# Patient Record
Sex: Female | Born: 1970 | Race: Black or African American | Hispanic: No | Marital: Single | Smoking: Never smoker
Health system: Southern US, Community
[De-identification: ages and names within clinical notes are randomized; demographics above are authoritative.]

## PROBLEM LIST (undated history)

## (undated) DIAGNOSIS — T7840XA Allergy, unspecified, initial encounter: Secondary | ICD-10-CM

## (undated) DIAGNOSIS — E785 Hyperlipidemia, unspecified: Secondary | ICD-10-CM

## (undated) DIAGNOSIS — M722 Plantar fascial fibromatosis: Secondary | ICD-10-CM

## (undated) DIAGNOSIS — D573 Sickle-cell trait: Secondary | ICD-10-CM

## (undated) DIAGNOSIS — R87619 Unspecified abnormal cytological findings in specimens from cervix uteri: Secondary | ICD-10-CM

## (undated) DIAGNOSIS — I4891 Unspecified atrial fibrillation: Secondary | ICD-10-CM

## (undated) DIAGNOSIS — K219 Gastro-esophageal reflux disease without esophagitis: Secondary | ICD-10-CM

## (undated) DIAGNOSIS — I10 Essential (primary) hypertension: Secondary | ICD-10-CM

## (undated) DIAGNOSIS — D571 Sickle-cell disease without crisis: Secondary | ICD-10-CM

## (undated) DIAGNOSIS — E669 Obesity, unspecified: Secondary | ICD-10-CM

## (undated) DIAGNOSIS — IMO0002 Reserved for concepts with insufficient information to code with codable children: Secondary | ICD-10-CM

## (undated) HISTORY — DX: Reserved for concepts with insufficient information to code with codable children: IMO0002

## (undated) HISTORY — DX: Plantar fascial fibromatosis: M72.2

## (undated) HISTORY — DX: Gastro-esophageal reflux disease without esophagitis: K21.9

## (undated) HISTORY — DX: Unspecified atrial fibrillation: I48.91

## (undated) HISTORY — PX: ILIAC VEIN ANGIOPLASTY / STENTING: SHX1788

## (undated) HISTORY — PX: TUBAL LIGATION: SHX77

## (undated) HISTORY — DX: Sickle-cell trait: D57.3

## (undated) HISTORY — DX: Essential (primary) hypertension: I10

## (undated) HISTORY — DX: Unspecified abnormal cytological findings in specimens from cervix uteri: R87.619

## (undated) HISTORY — DX: Sickle-cell disease without crisis: D57.1

## (undated) HISTORY — DX: Allergy, unspecified, initial encounter: T78.40XA

## (undated) HISTORY — DX: Hyperlipidemia, unspecified: E78.5

## (undated) HISTORY — DX: Obesity, unspecified: E66.9

---

## 1993-04-25 DIAGNOSIS — I10 Essential (primary) hypertension: Secondary | ICD-10-CM

## 1993-04-25 DIAGNOSIS — D573 Sickle-cell trait: Secondary | ICD-10-CM

## 1993-04-25 HISTORY — DX: Sickle-cell trait: D57.3

## 1993-04-25 HISTORY — DX: Essential (primary) hypertension: I10

## 1997-10-09 ENCOUNTER — Encounter: Admission: RE | Admit: 1997-10-09 | Discharge: 1997-10-09 | Payer: Self-pay | Admitting: Sports Medicine

## 1997-10-21 ENCOUNTER — Encounter: Admission: RE | Admit: 1997-10-21 | Discharge: 1997-10-21 | Payer: Self-pay | Admitting: Family Medicine

## 1997-11-10 ENCOUNTER — Encounter: Admission: RE | Admit: 1997-11-10 | Discharge: 1997-11-10 | Payer: Self-pay | Admitting: Family Medicine

## 1998-01-27 ENCOUNTER — Emergency Department (HOSPITAL_COMMUNITY): Admission: EM | Admit: 1998-01-27 | Discharge: 1998-01-27 | Payer: Self-pay | Admitting: Emergency Medicine

## 1999-03-09 ENCOUNTER — Emergency Department (HOSPITAL_COMMUNITY): Admission: EM | Admit: 1999-03-09 | Discharge: 1999-03-09 | Payer: Self-pay | Admitting: *Deleted

## 2000-07-14 ENCOUNTER — Emergency Department (HOSPITAL_COMMUNITY): Admission: EM | Admit: 2000-07-14 | Discharge: 2000-07-14 | Payer: Self-pay | Admitting: *Deleted

## 2000-08-18 ENCOUNTER — Encounter: Admission: RE | Admit: 2000-08-18 | Discharge: 2000-08-18 | Payer: Self-pay | Admitting: Family Medicine

## 2001-09-10 ENCOUNTER — Encounter: Payer: Self-pay | Admitting: Internal Medicine

## 2001-09-10 ENCOUNTER — Ambulatory Visit (HOSPITAL_COMMUNITY): Admission: RE | Admit: 2001-09-10 | Discharge: 2001-09-10 | Payer: Self-pay | Admitting: Internal Medicine

## 2002-06-12 ENCOUNTER — Ambulatory Visit (HOSPITAL_COMMUNITY): Admission: RE | Admit: 2002-06-12 | Discharge: 2002-06-12 | Payer: Self-pay | Admitting: Internal Medicine

## 2002-06-12 ENCOUNTER — Encounter: Payer: Self-pay | Admitting: Internal Medicine

## 2002-08-12 ENCOUNTER — Other Ambulatory Visit: Admission: RE | Admit: 2002-08-12 | Discharge: 2002-08-12 | Payer: Self-pay | Admitting: Family Medicine

## 2002-09-20 ENCOUNTER — Other Ambulatory Visit: Admission: RE | Admit: 2002-09-20 | Discharge: 2002-09-20 | Payer: Self-pay | Admitting: Family Medicine

## 2002-09-20 ENCOUNTER — Encounter: Admission: RE | Admit: 2002-09-20 | Discharge: 2002-09-20 | Payer: Self-pay | Admitting: Family Medicine

## 2002-09-20 ENCOUNTER — Encounter (INDEPENDENT_AMBULATORY_CARE_PROVIDER_SITE_OTHER): Payer: Self-pay

## 2002-10-04 ENCOUNTER — Encounter: Admission: RE | Admit: 2002-10-04 | Discharge: 2002-10-04 | Payer: Self-pay | Admitting: Obstetrics and Gynecology

## 2003-04-24 ENCOUNTER — Encounter: Admission: RE | Admit: 2003-04-24 | Discharge: 2003-04-24 | Payer: Self-pay | Admitting: Family Medicine

## 2003-04-24 ENCOUNTER — Encounter (INDEPENDENT_AMBULATORY_CARE_PROVIDER_SITE_OTHER): Payer: Self-pay | Admitting: *Deleted

## 2003-04-26 HISTORY — PX: ABDOMINAL HYSTERECTOMY: SHX81

## 2003-05-05 ENCOUNTER — Ambulatory Visit (HOSPITAL_COMMUNITY): Admission: RE | Admit: 2003-05-05 | Discharge: 2003-05-05 | Payer: Self-pay | Admitting: Family Medicine

## 2003-05-29 ENCOUNTER — Encounter: Admission: RE | Admit: 2003-05-29 | Discharge: 2003-05-29 | Payer: Self-pay | Admitting: Obstetrics and Gynecology

## 2003-05-29 ENCOUNTER — Emergency Department (HOSPITAL_COMMUNITY): Admission: EM | Admit: 2003-05-29 | Discharge: 2003-05-29 | Payer: Self-pay | Admitting: Emergency Medicine

## 2003-06-12 ENCOUNTER — Encounter: Admission: RE | Admit: 2003-06-12 | Discharge: 2003-06-12 | Payer: Self-pay | Admitting: Obstetrics and Gynecology

## 2003-07-28 ENCOUNTER — Inpatient Hospital Stay (HOSPITAL_COMMUNITY): Admission: RE | Admit: 2003-07-28 | Discharge: 2003-07-30 | Payer: Self-pay | Admitting: Family Medicine

## 2003-07-28 ENCOUNTER — Encounter (INDEPENDENT_AMBULATORY_CARE_PROVIDER_SITE_OTHER): Payer: Self-pay | Admitting: *Deleted

## 2003-08-12 ENCOUNTER — Encounter: Admission: RE | Admit: 2003-08-12 | Discharge: 2003-08-12 | Payer: Self-pay | Admitting: Obstetrics and Gynecology

## 2003-09-18 ENCOUNTER — Encounter: Admission: RE | Admit: 2003-09-18 | Discharge: 2003-09-18 | Payer: Self-pay | Admitting: Obstetrics and Gynecology

## 2004-01-22 ENCOUNTER — Ambulatory Visit: Payer: Self-pay | Admitting: Nurse Practitioner

## 2004-02-18 ENCOUNTER — Ambulatory Visit: Payer: Self-pay | Admitting: Nurse Practitioner

## 2004-02-27 ENCOUNTER — Ambulatory Visit: Payer: Self-pay | Admitting: Family Medicine

## 2004-08-02 ENCOUNTER — Ambulatory Visit: Payer: Self-pay | Admitting: Nurse Practitioner

## 2004-08-04 ENCOUNTER — Ambulatory Visit: Payer: Self-pay | Admitting: Nurse Practitioner

## 2005-06-29 ENCOUNTER — Ambulatory Visit: Payer: Self-pay | Admitting: Family Medicine

## 2005-07-15 ENCOUNTER — Ambulatory Visit: Payer: Self-pay | Admitting: Family Medicine

## 2005-07-25 ENCOUNTER — Ambulatory Visit: Payer: Self-pay | Admitting: Family Medicine

## 2005-08-15 ENCOUNTER — Ambulatory Visit: Payer: Self-pay | Admitting: Family Medicine

## 2005-08-22 ENCOUNTER — Ambulatory Visit: Payer: Self-pay | Admitting: Sports Medicine

## 2005-09-03 ENCOUNTER — Emergency Department (HOSPITAL_COMMUNITY): Admission: EM | Admit: 2005-09-03 | Discharge: 2005-09-04 | Payer: Self-pay | Admitting: Emergency Medicine

## 2005-12-01 ENCOUNTER — Ambulatory Visit: Payer: Self-pay | Admitting: Family Medicine

## 2005-12-01 ENCOUNTER — Ambulatory Visit (HOSPITAL_COMMUNITY): Admission: RE | Admit: 2005-12-01 | Discharge: 2005-12-01 | Payer: Self-pay | Admitting: Family Medicine

## 2005-12-07 ENCOUNTER — Ambulatory Visit (HOSPITAL_COMMUNITY): Admission: RE | Admit: 2005-12-07 | Discharge: 2005-12-07 | Payer: Self-pay | Admitting: Family Medicine

## 2005-12-07 ENCOUNTER — Encounter: Payer: Self-pay | Admitting: Vascular Surgery

## 2006-04-14 ENCOUNTER — Ambulatory Visit: Payer: Self-pay | Admitting: Nurse Practitioner

## 2006-04-19 ENCOUNTER — Ambulatory Visit (HOSPITAL_COMMUNITY): Admission: RE | Admit: 2006-04-19 | Discharge: 2006-04-19 | Payer: Self-pay | Admitting: Family Medicine

## 2006-04-19 ENCOUNTER — Encounter: Payer: Self-pay | Admitting: Vascular Surgery

## 2006-04-21 ENCOUNTER — Ambulatory Visit: Payer: Self-pay | Admitting: *Deleted

## 2006-04-27 ENCOUNTER — Ambulatory Visit: Payer: Self-pay | Admitting: Nurse Practitioner

## 2006-05-10 ENCOUNTER — Ambulatory Visit: Payer: Self-pay | Admitting: Nurse Practitioner

## 2006-06-22 DIAGNOSIS — I10 Essential (primary) hypertension: Secondary | ICD-10-CM | POA: Insufficient documentation

## 2006-06-22 DIAGNOSIS — K219 Gastro-esophageal reflux disease without esophagitis: Secondary | ICD-10-CM

## 2006-06-22 DIAGNOSIS — E669 Obesity, unspecified: Secondary | ICD-10-CM

## 2006-07-24 ENCOUNTER — Emergency Department (HOSPITAL_COMMUNITY): Admission: EM | Admit: 2006-07-24 | Discharge: 2006-07-24 | Payer: Self-pay | Admitting: Emergency Medicine

## 2006-10-11 ENCOUNTER — Emergency Department (HOSPITAL_COMMUNITY): Admission: EM | Admit: 2006-10-11 | Discharge: 2006-10-11 | Payer: Self-pay | Admitting: Family Medicine

## 2006-10-12 ENCOUNTER — Ambulatory Visit: Payer: Self-pay | Admitting: Internal Medicine

## 2006-10-16 ENCOUNTER — Ambulatory Visit (HOSPITAL_COMMUNITY): Admission: RE | Admit: 2006-10-16 | Discharge: 2006-10-16 | Payer: Self-pay | Admitting: Internal Medicine

## 2006-10-19 ENCOUNTER — Ambulatory Visit: Payer: Self-pay | Admitting: Internal Medicine

## 2006-10-20 ENCOUNTER — Ambulatory Visit (HOSPITAL_COMMUNITY): Admission: RE | Admit: 2006-10-20 | Discharge: 2006-10-20 | Payer: Self-pay | Admitting: Family Medicine

## 2006-10-20 ENCOUNTER — Ambulatory Visit: Payer: Self-pay | Admitting: Vascular Surgery

## 2006-10-20 ENCOUNTER — Encounter (INDEPENDENT_AMBULATORY_CARE_PROVIDER_SITE_OTHER): Payer: Self-pay | Admitting: Family Medicine

## 2007-01-10 ENCOUNTER — Encounter (INDEPENDENT_AMBULATORY_CARE_PROVIDER_SITE_OTHER): Payer: Self-pay | Admitting: *Deleted

## 2007-01-16 ENCOUNTER — Ambulatory Visit: Payer: Self-pay | Admitting: Vascular Surgery

## 2007-01-23 ENCOUNTER — Ambulatory Visit: Payer: Self-pay | Admitting: Vascular Surgery

## 2007-02-06 ENCOUNTER — Ambulatory Visit (HOSPITAL_COMMUNITY): Admission: RE | Admit: 2007-02-06 | Discharge: 2007-02-06 | Payer: Self-pay | Admitting: Surgery

## 2007-02-06 ENCOUNTER — Ambulatory Visit: Payer: Self-pay | Admitting: Surgery

## 2007-03-12 ENCOUNTER — Ambulatory Visit: Payer: Self-pay | Admitting: Surgery

## 2007-09-29 ENCOUNTER — Emergency Department (HOSPITAL_COMMUNITY): Admission: EM | Admit: 2007-09-29 | Discharge: 2007-09-29 | Payer: Self-pay | Admitting: Family Medicine

## 2007-11-02 ENCOUNTER — Ambulatory Visit: Payer: Self-pay | Admitting: Family Medicine

## 2007-11-02 ENCOUNTER — Telehealth: Payer: Self-pay | Admitting: *Deleted

## 2007-11-12 ENCOUNTER — Ambulatory Visit: Payer: Self-pay | Admitting: Family Medicine

## 2007-11-12 ENCOUNTER — Encounter: Payer: Self-pay | Admitting: Family Medicine

## 2007-11-12 ENCOUNTER — Ambulatory Visit: Payer: Self-pay | Admitting: Surgery

## 2007-11-15 LAB — CONVERTED CEMR LAB
AST: 16 units/L (ref 0–37)
Alkaline Phosphatase: 64 units/L (ref 39–117)
BUN: 21 mg/dL (ref 6–23)
Creatinine, Ser: 1.49 mg/dL — ABNORMAL HIGH (ref 0.40–1.20)
Glucose, Bld: 103 mg/dL — ABNORMAL HIGH (ref 70–99)
HDL: 29 mg/dL — ABNORMAL LOW (ref >39)
LDL Cholesterol: 98 mg/dL (ref 0–99)
Total Bilirubin: 0.4 mg/dL (ref 0.3–1.2)
Total CHOL/HDL Ratio: 5
Triglycerides: 90 mg/dL (ref <150)
VLDL: 18 mg/dL (ref 0–40)

## 2008-03-26 ENCOUNTER — Ambulatory Visit: Payer: Self-pay | Admitting: Family Medicine

## 2008-03-26 ENCOUNTER — Encounter: Payer: Self-pay | Admitting: Family Medicine

## 2008-03-26 LAB — CONVERTED CEMR LAB
CO2: 26 meq/L (ref 19–32)
Calcium: 10.2 mg/dL (ref 8.4–10.5)
Creatinine, Ser: 1.01 mg/dL (ref 0.40–1.20)
Sodium: 139 meq/L (ref 135–145)

## 2008-03-31 ENCOUNTER — Ambulatory Visit: Payer: Self-pay | Admitting: Family Medicine

## 2008-04-21 ENCOUNTER — Ambulatory Visit: Payer: Self-pay | Admitting: Surgery

## 2008-04-22 ENCOUNTER — Ambulatory Visit: Payer: Self-pay | Admitting: Family Medicine

## 2008-04-22 ENCOUNTER — Ambulatory Visit (HOSPITAL_COMMUNITY): Admission: RE | Admit: 2008-04-22 | Discharge: 2008-04-22 | Payer: Self-pay | Admitting: Family Medicine

## 2008-04-22 ENCOUNTER — Encounter: Payer: Self-pay | Admitting: Family Medicine

## 2008-04-22 LAB — CONVERTED CEMR LAB
Chloride: 102 meq/L (ref 96–112)
Creatinine, Ser: 1.23 mg/dL — ABNORMAL HIGH (ref 0.40–1.20)
Potassium: 4.3 meq/L (ref 3.5–5.3)
Sodium: 140 meq/L (ref 135–145)

## 2008-04-24 ENCOUNTER — Encounter: Payer: Self-pay | Admitting: Family Medicine

## 2008-06-27 ENCOUNTER — Encounter: Payer: Self-pay | Admitting: Family Medicine

## 2008-06-27 ENCOUNTER — Ambulatory Visit: Payer: Self-pay | Admitting: Family Medicine

## 2008-06-27 LAB — CONVERTED CEMR LAB
BUN: 19 mg/dL (ref 6–23)
CO2: 28 meq/L (ref 19–32)
Chloride: 102 meq/L (ref 96–112)
Potassium: 3.9 meq/L (ref 3.5–5.3)

## 2008-07-01 ENCOUNTER — Encounter: Payer: Self-pay | Admitting: Family Medicine

## 2008-10-20 ENCOUNTER — Ambulatory Visit: Payer: Self-pay | Admitting: Surgery

## 2009-02-03 ENCOUNTER — Ambulatory Visit: Payer: Self-pay | Admitting: Family Medicine

## 2009-02-03 ENCOUNTER — Encounter: Payer: Self-pay | Admitting: Family Medicine

## 2009-02-03 LAB — CONVERTED CEMR LAB
BUN: 9 mg/dL (ref 6–23)
Chloride: 102 meq/L (ref 96–112)
Creatinine, Ser: 0.92 mg/dL (ref 0.40–1.20)
Glucose, Bld: 107 mg/dL — ABNORMAL HIGH (ref 70–99)
Potassium: 3.9 meq/L (ref 3.5–5.3)

## 2009-03-09 ENCOUNTER — Ambulatory Visit: Payer: Self-pay | Admitting: Family Medicine

## 2009-04-13 ENCOUNTER — Ambulatory Visit: Payer: Self-pay | Admitting: Surgery

## 2009-06-04 ENCOUNTER — Ambulatory Visit: Payer: Self-pay | Admitting: Family Medicine

## 2009-06-04 DIAGNOSIS — K648 Other hemorrhoids: Secondary | ICD-10-CM | POA: Insufficient documentation

## 2009-07-24 ENCOUNTER — Telehealth: Payer: Self-pay | Admitting: Family Medicine

## 2009-07-28 ENCOUNTER — Ambulatory Visit: Payer: Self-pay | Admitting: Family Medicine

## 2009-08-05 ENCOUNTER — Encounter: Payer: Self-pay | Admitting: Family Medicine

## 2009-10-19 ENCOUNTER — Ambulatory Visit: Payer: Self-pay | Admitting: Surgery

## 2010-02-12 ENCOUNTER — Ambulatory Visit: Payer: Self-pay | Admitting: Family Medicine

## 2010-03-17 ENCOUNTER — Ambulatory Visit: Payer: Self-pay | Admitting: Family Medicine

## 2010-03-17 ENCOUNTER — Encounter: Payer: Self-pay | Admitting: Family Medicine

## 2010-03-17 ENCOUNTER — Encounter: Admission: RE | Admit: 2010-03-17 | Discharge: 2010-03-17 | Payer: Self-pay | Admitting: Family Medicine

## 2010-03-17 DIAGNOSIS — R109 Unspecified abdominal pain: Secondary | ICD-10-CM | POA: Insufficient documentation

## 2010-03-22 ENCOUNTER — Encounter: Payer: Self-pay | Admitting: Family Medicine

## 2010-03-22 LAB — CONVERTED CEMR LAB
BUN: 16 mg/dL (ref 6–23)
Calcium: 9.8 mg/dL (ref 8.4–10.5)
Cholesterol: 159 mg/dL (ref 0–200)
Glucose, Bld: 119 mg/dL — ABNORMAL HIGH (ref 70–99)
Sodium: 140 meq/L (ref 135–145)

## 2010-05-15 ENCOUNTER — Encounter: Payer: Self-pay | Admitting: Family Medicine

## 2010-05-17 ENCOUNTER — Encounter: Payer: Self-pay | Admitting: Infectious Diseases

## 2010-05-17 ENCOUNTER — Ambulatory Visit: Admit: 2010-05-17 | Payer: Self-pay | Admitting: Surgery

## 2010-05-17 ENCOUNTER — Ambulatory Visit
Admission: RE | Admit: 2010-05-17 | Discharge: 2010-05-17 | Payer: Self-pay | Source: Home / Self Care | Attending: Surgery | Admitting: Surgery

## 2010-05-18 NOTE — Assessment & Plan Note (Addendum)
OFFICE VISIT  TYSHELL, RAMBERG D DOB:  [birth year]                                       05/17/2010 CZYSA#:[id number]  The patient comes back in today for follow-up.  She is status post stenting of her left iliac vein on 04/08/2007.  This was done using a Cordis 12 x 60 self-expanding stent.  It was placed for tightness and swelling in her left leg.  CT scan showed compression of her iliac vein. She has done very well since this has been done.  She does not have any swelling or tightness in her leg.  She will occasionally wear her compression stockings.  PHYSICAL EXAMINATION:  Heart rate 92, blood pressure 132/90, temperature 93.  General:  She is well-appearing, in no distress.  Respirations nonlabored.  Extremities:  Warm and well-perfused.  No edema bilaterally.  Skin:  Without rash.  DIAGNOSTIC STUDIES:  Venous ultrasound was performed today which shows patent iliac stent with no evidence of DVT or venous insufficiency.  ASSESSMENT/PLAN:  Status post left iliac vein stent.  PLAN:  The patient will need to get yearly ultrasounds.  I am going to have her be followed up in the [facility] clinic in 1 year and  I will see her back in 2 years.  I have instructed the patient that if she starts to develop recurrent symptoms to contact our office when they occur.    Jorge Ny, MD Electronically Signed  VWB/MEDQ  D:  05/17/2010  T:  05/18/2010  Job:  [id number]  cc:   Tresa Endo L. Philipp Deputy, M.D.

## 2010-05-19 ENCOUNTER — Ambulatory Visit: Admit: 2010-05-19 | Payer: Self-pay

## 2010-05-24 NOTE — Procedures (Unsigned)
DUPLEX DEEP VENOUS EXAM - LOWER EXTREMITY  INDICATION:  Followup left external iliac vein stent.  HISTORY:  Edema:  No. Trauma/Surgery:  Left iliac vein stent. Pain:  No. PE:  No. Previous DVT:  No. Anticoagulants:  Aspirin. Other:  DUPLEX EXAM:               CFV   SFV   PopV  PTV    GSV               R  L  R  L  R  L  R   L  R  L Thrombosis    o  o     o     o      o     o Spontaneous   +  +     +     +      +     + Phasic        +  +     +     +      +     + Augmentation  +  +     +     +      +     + Compressible  +  +     +     +      +     + Competent     +  +     +     +      +     +  Legend:  + - yes  o - no  p - partial  D - decreased  IMPRESSION:  No evidence of acute deep vein or superficial thrombophlebitis within the left lower extremity.  A patent left external iliac vein stent.   _____________________________ V. Charlena Cross, MD  OD/MEDQ  D:  05/17/2010  T:  05/17/2010  Job:  [id number]

## 2010-05-27 NOTE — Letter (Signed)
Summary: Lipid Letter  [facility]  [address]   [place], [place] [address]   Phone: [phone]  Fax: [phone]    03/22/2010  [name] [address] [place], [place]  [address]  Dear [name]:  We have carefully reviewed your last lipid profile from 03/17/2010 and the results are noted below with a summary of recommendations for lipid management.    Cholesterol:       159     Goal: < 200   HDL "good" Cholesterol:   32     Goal: > 40   LDL "bad" Cholesterol:   110     Goal: < 130 , previous 98 ( 2009)   Triglycerides:       85     Goal: < 150     Your basic metabolic panel was within normal limits,with exception of slightly elevated fasting blood sugar. I am concerned with your hypertension, increasing "bad" cholesterol and weight you are increasing your risk for developing diabetes. I will recheck your basic metabolic panel at our next visit.  Please continue to work on your healthy lifestyle and walking program.     Adjunctive Measures (may lower LIPIDS and reduce risk of Heart Attack) include: Aerobic Exercise (20-30 minutes 3-4 times a week) Limit Alcohol Consumption Weight Reduction Dietary Fiber 20-30 grams a day by mouth     Current Medications: 1)    Aspirin Adult Low Strength 81 Mg Tbec (Aspirin) .Marland Kitchen.. 1 by mouth daily 2)    Lisinopril-hydrochlorothiazide 20-25 Mg Tabs (Lisinopril-hydrochlorothiazide) .Marland Kitchen.. 1 by mouth daily 3)    Colace 100 Mg Caps (Docusate sodium) .Marland Kitchen.. 1 by mouth two times a day 4)    Dilitazem Cream  .... To rectum per surgery 5)    Hydroxyzine Hcl 25 Mg Tabs (Hydroxyzine hcl) .Marland Kitchen.. 1 by mouth q 6hrs as needed itching  If you have any questions, please call. We appreciate being able to work with you.   Sincerely,    [facility] Milinda Antis MD  Appended Document: Lipid Letter mailed

## 2010-05-27 NOTE — Assessment & Plan Note (Signed)
Summary: CPE/KH   Vital Signs:  Patient profile:   40 year old female Height:      64.25 inches Weight:      228 pounds BMI:     38.97 Pulse rate:   80 / minute BP sitting:   136 / 97  (right arm)  Vitals Entered By: Arlyss Repress CMA, (March 17, 2010 9:00 AM) CC: CPE/pelvic pain Is Patient Diabetic? No Pain Assessment Patient in pain? yes     Location: pelvis Intensity: 9   Primary Care Provider:  Milinda Antis MD  CC:  CPE/pelvic pain.  History of Present Illness:  Having pelvic pain and pressure on RLQ, occurs almost monthly pain has been persistant for years, worse over the past 6 months, no bleeding,no discharge, last sexually active 2 months prior to that > 2 years ago , s/p hysterctomy ovaries in tact, not sure of cervix History of abnornal PAP Smears, has had colposcopyy  no fever, no vomiting  Has not had Mammogram  Declined Flu shot   DID not take BP meds this AM, fasting for labs  Habits & Providers  Alcohol-Tobacco-Diet     Tobacco Status: never  Allergies: No Known Drug Allergies  Past History:  Past Medical History: Baseline EKG- No LVH/ UA - no protein, creat 1.0 06/2005 HTN obesity hemorrhoids  Past Surgical History: hysterectomy 4/05 for fibroids - 06/30/2005 -- cervix removed - transvaginal  Physical Exam  General:  alert, well-developed, and well-nourished.  Vital signs noted Overweight  Breasts:  No mass, nodules, thickening, tenderness, bulging, retraction, inflamation, nipple discharge or skin changes noted.   Lungs:  normal respiratory effort.  CTAB Heart:  RRR, no murmur Abdomen:  soft, non-tender, normal bowel sounds, no distention, no masses, no guarding, and no rebound tenderness.   Genitalia:  normal introitus and no external lesions.  thin white discharge no odor, cervix not visuazlied pain with palpatuion near right ovary ? small vaginal polyp no friability no hemorrhage Axillary Nodes:  none   Impression &  Recommendations:  Problem # 1:  ROUTINE GYNECOLOGICAL EXAMINATION (ICD-V72.31) Assessment New  s/p hysterectomy PAP not needed Send for Mammogram Pelvic pain on exam see below  Orders: [facility] - Est  40-64 yrs ([address])  Problem # 2:  PELVIC PAIN, CHRONIC (ICD-789.09) Assessment: Deteriorated Will send for transvaginal U/S to look at ovaries, also to evalute small polyp seen, may need hystersonogram and referal to gyn Her updated medication list for this problem includes:    Aspirin Adult Low Strength 81 Mg Tbec (Aspirin) .Marland Kitchen... 1 by mouth daily  Orders: Ultrasound (Ultrasound) [facility] - Est  40-64 yrs ([address])  Problem # 3:  OBESITY, NOS (ICD-278.00) Assessment: Unchanged  Complete Medication List: 1)  Aspirin Adult Low Strength 81 Mg Tbec (Aspirin) .Marland Kitchen.. 1 by mouth daily 2)  Lisinopril-hydrochlorothiazide 20-25 Mg Tabs (Lisinopril-hydrochlorothiazide) .Marland Kitchen.. 1 by mouth daily 3)  Colace 100 Mg Caps (Docusate sodium) .Marland Kitchen.. 1 by mouth two times a day 4)  Dilitazem Cream  .... To rectum per surgery 5)  Hydroxyzine Hcl 25 Mg Tabs (Hydroxyzine hcl) .Marland Kitchen.. 1 by mouth q 6hrs as needed itching  Other Orders: Lipid-[facility] ([id number]) Basic Met-[facility] ([id number])  Patient Instructions: 1)  We will send you results of your PAP smear in the mail 2)  Please schedule your Mammogram 3)  I will call you with the U/S results 4)  You will recieve a letter about the lab results   Orders Added: 1)  Lipid-[facility] [[id number]] 2)  Basic  Met-[facility] [[id number]] 3)  Ultrasound [Ultrasound] 4)  [facility] - Est  40-64 yrs [[id number]]     Prevention & Chronic Care Immunizations   Influenza vaccine: Not documented   Influenza vaccine deferral: Refused  (03/17/2010)    Tetanus booster: 06/23/2001: Done.   Tetanus booster due: 06/24/2011    Pneumococcal vaccine: Not documented   Pneumococcal vaccine deferral: Not indicated  (03/17/2010)   Pneumococcal vaccine due: 07/05/2034  Other Screening   Pap smear: Not  documented   Pap smear due: Not Indicated    Mammogram: Not documented   Smoking status: never  (03/17/2010)  Lipids   Total Cholesterol: 145  (11/12/2007)   LDL: 98  (11/12/2007)   LDL Direct: Not documented   HDL: 29  (11/12/2007)   Triglycerides: 90  (11/12/2007)  Hypertension   Last Blood Pressure: 136 / 97  (03/17/2010)   Serum creatinine: 0.92  (02/03/2009)   Serum potassium 3.9  (02/03/2009)  Self-Management Support :   Personal Goals (by the next clinic visit) :      Personal blood pressure goal: 140/90  (02/03/2009)   Hypertension self-management support: Not documented

## 2010-05-27 NOTE — Consult Note (Signed)
Summary: [facility]   Imported By: De Nurse 09/11/2009 16:06:41  _____________________________________________________________________  External Attachment:    Type:   Image     Comment:   External Document

## 2010-05-27 NOTE — Progress Notes (Signed)
Summary: phn msg   Phone Note Call from Patient Call back at Home Phone [phone]   Caller: Patient Summary of Call: Wanted Dr. Jeanice Lim to know that her hemmoriods were not any better and that she was going to try and get one more refill on meds and try that. Initial call taken by: Clydell Hakim,  July 24, 2009 4:06 PM  Follow-up for Phone Call        fwd. to Dr.[place] for information. Follow-up by: Arlyss Repress CMA,,  July 24, 2009 4:09 PM

## 2010-05-27 NOTE — Assessment & Plan Note (Signed)
Summary: tired and achey,tcb   Vital Signs:  Patient profile:   40 year old female Height:      64.25 inches Weight:      224 pounds BMI:     38.29 Temp:     98.8 degrees F oral Pulse rate:   114 / minute BP sitting:   121 / 86  (left arm) Cuff size:   large  Vitals Entered By: Tessie Fass CMA (June 04, 2009 3:10 PM) CC: blood in stool/ HTN Is Patient Diabetic? No Pain Assessment Patient in pain? no        Primary Care Provider:  Milinda Antis MD  CC:  blood in stool/ HTN.  History of Present Illness:    1. Blood in stool- history of internal hemorrhoids. Past few days noted BRBPR, pain with defecation. On one instance had to pus hemorrhoid up. Has not tried any OTC meds, has seen  surgeon before when first diagnosed, supportive care only   No abdominal pain, no change in by mouth   2. HTN- tolerating combo pill, no side effects. BP runs 90-140/55-60   Habits & Providers  Alcohol-Tobacco-Diet     Tobacco Status: never  Current Medications (verified): 1)  Aspirin Adult Low Strength 81 Mg Tbec (Aspirin) .Marland Kitchen.. 1 By Mouth Daily 2)  Lisinopril-Hydrochlorothiazide 20-25 Mg Tabs (Lisinopril-Hydrochlorothiazide) .Marland Kitchen.. 1 By Mouth Daily 3)  Anusol-Hc 25 Mg Supp (Hydrocortisone Acetate) .Marland Kitchen.. 1suppository Per Rectum At Bedtime X 1 Week 4)  Colace 100 Mg Caps (Docusate Sodium) .Marland Kitchen.. 1 By Mouth Two Times A Day  Allergies (verified): No Known Drug Allergies  Physical Exam  General:  Well-developed,well-nourished,in no acute distress; alert,appropriate and cooperative throughout examination Vital signs noted overweight-appearing.   Heart:  RRR, no murmur Rectal:  normal sphincter tone, no fissures, stool positive for occult blood, and internal hemorrhoid(s).  gross blood on gloved finger and Guaiac, +skin tags   Impression & Recommendations:  Problem # 1:  HYPERTENSION, BENIGN SYSTEMIC (ICD-401.1) Assessment Improved  Continue current meds Her updated medication  list for this problem includes:    Lisinopril-hydrochlorothiazide 20-25 Mg Tabs (Lisinopril-hydrochlorothiazide) .Marland Kitchen... 1 by mouth daily  Orders: [facility]- Est Level  3 ([address])  Problem # 2:  HEMORRHOIDS, INTERNAL (ICD-455.0) Assessment: New  supportive care, steroid suppository, if bleeding persist or worsens, surgical referral  Orders: [facility]- Est Level  3 ([address])  Complete Medication List: 1)  Aspirin Adult Low Strength 81 Mg Tbec (Aspirin) .Marland Kitchen.. 1 by mouth daily 2)  Lisinopril-hydrochlorothiazide 20-25 Mg Tabs (Lisinopril-hydrochlorothiazide) .Marland Kitchen.. 1 by mouth daily 3)  Anusol-hc 25 Mg Supp (Hydrocortisone acetate) .Marland Kitchen.. 1suppository per rectum at bedtime x 1 week 4)  Colace 100 Mg Caps (Docusate sodium) .Marland Kitchen.. 1 by mouth two times a day  Patient Instructions: 1)  Use the hydrocortisone suppository for 1 week 2)  Start metamucil 1 glass daily x 6 weeks 3)  Sitz baths 4)  If bleeding persists or worsens please call and I will send you for evaluation 5)  You can take the stool softner twice a day 6)  Return in 3 months for your blood pressure Prescriptions: COLACE 100 MG CAPS (DOCUSATE SODIUM) 1 by mouth two times a day  #60 x 2   Entered and Authorized by:   Milinda Antis MD   Signed by:   Milinda Antis MD on 06/04/2009   Method used:   Electronically to        [facility]  [address] # [id number]* (retail)  [address]       [place], [place]  [address]       Ph: [phone] or [phone]       Fax: [phone]   RxID:   [id number] LISINOPRIL-HYDROCHLOROTHIAZIDE 20-25 MG TABS (LISINOPRIL-HYDROCHLOROTHIAZIDE) 1 by mouth daily  #30 x 6   Entered and Authorized by:   Milinda Antis MD   Signed by:   Milinda Antis MD on 06/04/2009   Method used:   Electronically to        [facility]  [address] # [id number]* (retail)       [address]       [place], [place]  [address]       Ph: [phone] or [phone]       Fax: [phone]   RxID:    [id number] ANUSOL-HC 25 MG SUPP (HYDROCORTISONE ACETATE) 1suppository per rectum at bedtime x 1 week  #7 x 1   Entered and Authorized by:   Milinda Antis MD   Signed by:   Milinda Antis MD on 06/04/2009   Method used:   Electronically to        [facility]  [address] # [id number]* (retail)       [address]       [place], [place]  [address]       Ph: [phone] or [phone]       Fax: [phone]   RxID:   [id number]    Prevention & Chronic Care Immunizations   Influenza vaccine: Not documented    Tetanus booster: 06/23/2001: Done.   Tetanus booster due: 06/24/2011    Pneumococcal vaccine: Not documented  Other Screening   Pap smear: Not documented   Pap smear due: Not Indicated   Smoking status: never  (06/04/2009)  Lipids   Total Cholesterol: 145  (11/12/2007)   LDL: 98  (11/12/2007)   LDL Direct: Not documented   HDL: 29  (11/12/2007)   Triglycerides: 90  (11/12/2007)  Hypertension   Last Blood Pressure: 121 / 86  (06/04/2009)   Serum creatinine: 0.92  (02/03/2009)   Serum potassium 3.9  (02/03/2009)    Hypertension flowsheet reviewed?: Yes   Progress toward BP goal: At goal  Self-Management Support :   Personal Goals (by the next clinic visit) :      Personal blood pressure goal: 140/90  (02/03/2009)   Hypertension self-management support: Not documented

## 2010-05-27 NOTE — Letter (Signed)
Summary: Out of Work  [facility]  [address]   [place], [place] [address]   Phone: [phone]  Fax: [phone]    March 17, 2010   Employee:  AMIA RYNDERS    To Whom It May Concern:   For Medical reasons, please excuse the above named employee from work/tardiness for the following dates:  Start:   Mar 17, 2010  End:   Mar 17, 2010  If you need additional information, please feel free to contact our office.         Sincerely,    Milinda Antis MD

## 2010-05-27 NOTE — Assessment & Plan Note (Signed)
Summary: hemmoriods,tcb   Vital Signs:  Patient profile:   40 year old female Height:      64.25 inches Weight:      221.9 pounds BMI:     37.93 Temp:     98.5 degrees F oral Pulse rate:   93 / minute BP sitting:   124 / 86  (right arm) Cuff size:   regular  Vitals Entered By: Garen Grams LPN (July 28, 452 3:31 PM) CC: hemmrhiods Is Patient Diabetic? No Pain Assessment Patient in pain? no        Primary Care Provider:  Milinda Antis MD  CC:  hemmrhiods.  History of Present Illness:       This is a 40 year old female who presents with hemorrhoids.  The patient complains of pain with defecation, blood in stool, and blood streaked stool, but denies constipation and straining.  Preivious ineffective therapy includes suppository with hydrocortisone, stool softeners, and high-fiber diet.  Had anal fissure in past.  Habits & Providers  Alcohol-Tobacco-Diet     Tobacco Status: never  Current Problems (verified): 1)  Hemorrhoids, Internal  (ICD-455.0) 2)  Hypertension, Benign Systemic  (ICD-401.1) 3)  Obesity, Nos  (ICD-278.00) 4)  Gastroesophageal Reflux, No Esophagitis  (ICD-530.81)  Current Medications (verified): 1)  Aspirin Adult Low Strength 81 Mg Tbec (Aspirin) .Marland Kitchen.. 1 By Mouth Daily 2)  Lisinopril-Hydrochlorothiazide 20-25 Mg Tabs (Lisinopril-Hydrochlorothiazide) .Marland Kitchen.. 1 By Mouth Daily 3)  Anusol-Hc 25 Mg Supp (Hydrocortisone Acetate) .Marland Kitchen.. 1suppository Per Rectum At Bedtime X 1 Week 4)  Colace 100 Mg Caps (Docusate Sodium) .Marland Kitchen.. 1 By Mouth Two Times A Day  Allergies (verified): No Known Drug Allergies  Past History:  Past Medical History: Last updated: 06/22/2006 Baseline EKG- No LVH/ UA - no protein, creat 1.0 06/2005  Past Surgical History: Last updated: 06/22/2006 hysterectomy 4/05 for fibroids - 06/30/2005  Family History: Last updated: 06/22/2006 mom has diabetes, Sister is deaf and had radio ablatio for WPW syndrome. Otherwise HTN is common among her  family.  Social History: Last updated: 06/22/2006 Lives in [place]. Works and goes to school at [employer] Lives with her Daughter, [place]. Denies any EtOH or tobacco use.  Risk Factors: Smoking Status: never (07/28/2009)  Review of Systems  The patient denies anorexia, weight loss, weight gain, chest pain, syncope, dyspnea on exertion, peripheral edema, prolonged cough, headaches, abdominal pain, and severe indigestion/heartburn.    Physical Exam  General:  alert, well-developed, and well-nourished.   Head:  normocephalic and atraumatic.   Neck:  supple.   Lungs:  normal respiratory effort.   Heart:  normal rate.   Abdomen:  soft and non-tender.   Rectal:  no fissures and external hemorrhoid(s).     Impression & Recommendations:  Problem # 1:  HEMORRHOIDS, INTERNAL (ICD-455.0) > 6 wks conservative therapy without signifcant improvement. Will refer to Gen. surg. Orders: [facility]- Est Level  2 ([address]) Surgical Referral (Surgery)  Complete Medication List: 1)  Aspirin Adult Low Strength 81 Mg Tbec (Aspirin) .Marland Kitchen.. 1 by mouth daily 2)  Lisinopril-hydrochlorothiazide 20-25 Mg Tabs (Lisinopril-hydrochlorothiazide) .Marland Kitchen.. 1 by mouth daily 3)  Anusol-hc 25 Mg Supp (Hydrocortisone acetate) .Marland Kitchen.. 1suppository per rectum at bedtime x 1 week 4)  Colace 100 Mg Caps (Docusate sodium) .Marland Kitchen.. 1 by mouth two times a day  Patient Instructions: 1)  Please schedule a follow-up appointment as needed .

## 2010-05-27 NOTE — Assessment & Plan Note (Signed)
Summary: f/u,HTN, rash   Vital Signs:  Patient profile:   40 year old female Height:      64.25 inches Weight:      229 pounds BMI:     39.14 Temp:     99.2 degrees F oral Pulse rate:   86 / minute BP sitting:   128 / 87  (left arm) Cuff size:   regular  Vitals Entered By: Tessie Fass CMA (February 12, 2010 3:25 PM) CC: HTN/itching Is Patient Diabetic? No Pain Assessment Patient in pain? no        Primary Care Jersee Winiarski:  Milinda Antis MD  CC:  HTN/itching.  History of Present Illness:   Itching over neck, had rash on middle of neck came up 2 weeks ago, had fine bumps spread across chest, mild redness, itchy, no change in detergent, no change in soap , no change in bodywash or lotion  occasinally a deep itch all over, no new foods, no swelling of tongue or face  Used mucinex OTC but no other medications , no rashes at daycare, no pets Use Keri lotion   HTN- taking meds without difficulty, no HA, no chest pain, no leg swelling  Habits & Providers  Alcohol-Tobacco-Diet     Tobacco Status: never  Current Medications (verified): 1)  Aspirin Adult Low Strength 81 Mg Tbec (Aspirin) .Marland Kitchen.. 1 By Mouth Daily 2)  Lisinopril-Hydrochlorothiazide 20-25 Mg Tabs (Lisinopril-Hydrochlorothiazide) .Marland Kitchen.. 1 By Mouth Daily 3)  Colace 100 Mg Caps (Docusate Sodium) .Marland Kitchen.. 1 By Mouth Two Times A Day 4)  Dilitazem Cream .... To Rectum Per Surgery 5)  Hydroxyzine Hcl 25 Mg Tabs (Hydroxyzine Hcl) .Marland Kitchen.. 1 By Mouth Q 6hrs As Needed Itching 6)  Loratadine 10 Mg Tabs (Loratadine) .Marland Kitchen.. 1 Tab By Mouth Daily As Needed Itching 7)  Clotrimazole 1 % Crea (Clotrimazole) .... Apply To Affected Areas On Skin Twice A Day For 2 Weeks Dispense - 1 Large Tube  Allergies (verified): No Known Drug Allergies  Past History:  Past Medical History: Baseline EKG- No LVH/ UA - no protein, creat 1.0 06/2005 HTN  Physical Exam  General:  alert, well-developed, and well-nourished.  Vital signs noted Overweight    Mouth:  no oral lesions Heart:  RRR, no murmur Skin:  1cm circular hyperpigmented lesion in center of anterior neck, slightly hypopigmented ring around it, multiple scattered small erythematous maculopapular lesions on chest, +excoriations uriticarial appearane scattred on upper ext, back clear, abd clear, leg clear Cervical Nodes:  none   Impression & Recommendations:  Problem # 1:  HYPERTENSION, BENIGN SYSTEMIC (ICD-401.1) Assessment Unchanged  Her updated medication list for this problem includes:    Lisinopril-hydrochlorothiazide 20-25 Mg Tabs (Lisinopril-hydrochlorothiazide) .Marland Kitchen... 1 by mouth daily  Orders: [facility]- Est  Level 4 (99214)  Problem # 2:  TINEA CORPORIS (ICD-110.5) Assessment: New  2 week course topial clotrimazole  Orders: [facility]- Est  Level 4 (11914)  Complete Medication List: 1)  Aspirin Adult Low Strength 81 Mg Tbec (Aspirin) .Marland Kitchen.. 1 by mouth daily 2)  Lisinopril-hydrochlorothiazide 20-25 Mg Tabs (Lisinopril-hydrochlorothiazide) .Marland Kitchen.. 1 by mouth daily 3)  Colace 100 Mg Caps (Docusate sodium) .Marland Kitchen.. 1 by mouth two times a day 4)  Dilitazem Cream  .... To rectum per surgery 5)  Hydroxyzine Hcl 25 Mg Tabs (Hydroxyzine hcl) .Marland Kitchen.. 1 by mouth q 6hrs as needed itching 6)  Loratadine 10 Mg Tabs (Loratadine) .Marland Kitchen.. 1 tab by mouth daily as needed itching 7)  Clotrimazole 1 % Crea (Clotrimazole) .... Apply to affected  areas on skin twice a day for 2 weeks dispense - 1 large tube  Other Orders: [facility] (16109)  Patient Instructions: 1)  Schedule a exam for a complete physical , do not eat after midnight so schedule a morning appt 2)  For your itching use the itching pill and the fungal cream 3)  Return if the rash does not improve Prescriptions: CLOTRIMAZOLE 1 % CREA (CLOTRIMAZOLE) apply to affected areas on skin twice a day for 2 weeks Dispense - 1 large tube  #1 x 0   Entered and Authorized by:   Milinda Antis MD   Signed by:   Milinda Antis MD on 02/12/2010   Method  used:   Electronically to        [facility]  [address] # [id number]* (retail)       [address]       [place], [place]  [address]       Ph: [phone] or [phone]       Fax: [phone]   RxID:   [id number] LORATADINE 10 MG TABS (LORATADINE) 1 tab by mouth daily as needed itching  #30 x 3   Entered and Authorized by:   Milinda Antis MD   Signed by:   Milinda Antis MD on 02/12/2010   Method used:   Electronically to        [facility]  [address] [id number]* (retail)       [address]       [place], [place]  [address]       Ph: [phone] or [phone]       Fax: [phone]   RxID:   [id number] HYDROXYZINE HCL 25 MG TABS (HYDROXYZINE HCL) 1 by mouth q 6hrs as needed itching  #30 x 1   Entered and Authorized by:   Milinda Antis MD   Signed by:   Milinda Antis MD on 02/12/2010   Method used:   Electronically to        [facility]  [address] [facility]* (retail)       [address]       [place], [place]  [address]       Ph: [phone] or [phone]       Fax: [phone]   RxID:   [id number]    Orders Added: 1)  [facility] [87220] 2)  [facility]- Est  Level 4 [93716]     Prevention & Chronic Care Immunizations   Influenza vaccine: Not documented    Tetanus booster: 06/23/2001: Done.   Tetanus booster due: 06/24/2011    Pneumococcal vaccine: Not documented  Other Screening   Pap smear: Not documented   Pap smear due: Not Indicated    Mammogram: Not documented   Smoking status: never  (02/12/2010)  Lipids   Total Cholesterol: 145  (11/12/2007)   LDL: 98  (11/12/2007)   LDL Direct: Not documented   HDL: 29  (11/12/2007)   Triglycerides: 90  (11/12/2007)  Hypertension   Last Blood Pressure: 128 / 87  (02/12/2010)   Serum creatinine: 0.92  (02/03/2009)   Serum potassium 3.9  (02/03/2009)    Hypertension flowsheet reviewed?: Yes   Progress toward BP goal: At goal  Self-Management  Support :   Personal Goals (by the next clinic visit) :      Personal blood pressure goal: 140/90  (02/03/2009)  Hypertension self-management support: Not documented       Laboratory Results  Date/Time Received: February 12, 2010 4:30 PM  Date/Time Reported: February 12, 2010 4:39 PM   Other Tests  Skin KOH: Positive Comments: ...........test performed by...........Marland KitchenTerese Door, CMA

## 2010-06-02 ENCOUNTER — Encounter: Payer: Self-pay | Admitting: Family Medicine

## 2010-06-02 ENCOUNTER — Ambulatory Visit (INDEPENDENT_AMBULATORY_CARE_PROVIDER_SITE_OTHER): Payer: BC Managed Care – PPO | Admitting: Family Medicine

## 2010-06-02 VITALS — BP 136/96 | HR 89 | Temp 98.7°F | Ht 63.5 in | Wt 230.6 lb

## 2010-06-02 DIAGNOSIS — E669 Obesity, unspecified: Secondary | ICD-10-CM

## 2010-06-02 DIAGNOSIS — I10 Essential (primary) hypertension: Secondary | ICD-10-CM

## 2010-06-02 DIAGNOSIS — E119 Type 2 diabetes mellitus without complications: Secondary | ICD-10-CM | POA: Insufficient documentation

## 2010-06-02 DIAGNOSIS — R21 Rash and other nonspecific skin eruption: Secondary | ICD-10-CM

## 2010-06-02 LAB — BASIC METABOLIC PANEL
CO2: 28 mEq/L (ref 19–32)
Calcium: 9.7 mg/dL (ref 8.4–10.5)
Sodium: 139 mEq/L (ref 135–145)

## 2010-06-02 LAB — CONVERTED CEMR LAB
Glucose, Bld: 91 mg/dL (ref 70–99)
Potassium: 3.8 meq/L (ref 3.5–5.3)
Sodium: 139 meq/L (ref 135–145)

## 2010-06-02 MED ORDER — AMLODIPINE BESYLATE 5 MG PO TABS: 5.0000 mg | ORAL_TABLET | Freq: Every day | ORAL | Status: DC

## 2010-06-02 MED ORDER — METFORMIN HCL ER 500 MG PO TB24: 500.0000 mg | ORAL_TABLET | Freq: Every day | ORAL | Status: DC

## 2010-06-02 MED ORDER — GLUCOSE BLOOD VI KIT: PACK | Status: DC

## 2010-06-02 MED ORDER — AUTO-LANCETS MISC: Status: DC

## 2010-06-02 MED ORDER — GLUCOSE BLOOD VI STRP: ORAL_STRIP | Status: DC

## 2010-06-02 NOTE — Assessment & Plan Note (Signed)
Will obtain A1C concern for DM

## 2010-06-02 NOTE — Assessment & Plan Note (Signed)
Appears to be acanthosis nigricans based on exam, however pt with recent fungal infection, will obtain skin scraping and follow-up, hold on treatment until results seen PRN cortisone for itch

## 2010-06-02 NOTE — Assessment & Plan Note (Signed)
Discussed continued weight gain, espc with chronic medical conditions currently hypertension Reiterated changes in diet, adding veggies with each meal and decrease soda consumption increase water consumption Addition of aerobic exercise, currently walks some at daycare

## 2010-06-02 NOTE — Patient Instructions (Addendum)
Next visit in 3 months  I will call you about the lab results and prescribe medication based on results Continue to work on your weight loss and make sure you are eating veggies with each meal and lots of fruits Cut out the soda  You should eat approx 1800 calories a day and work out 5 days a week for 30 minutes- some aerobic exercise Eat at least 3 meals a day and 2 snacks

## 2010-06-02 NOTE — Assessment & Plan Note (Addendum)
Will continue HCTZ/lisinpirl, vast improvement in BP over the past year, add  of norvasc 5mg  for better control

## 2010-06-02 NOTE — Progress Notes (Signed)
  Subjective:    Patient ID: [name], female    DOB: 1970-09-05, 40 y.o.   MRN: [id number]  HPI HTN- tolearting BP meds without difficulty, no HA, no SOB, no leg swelling      Neck rash- rash on neck x 2 weeks only on right side, +pruritis, no redness, no neck swelling, no recent illness, no change in medications recently, no new soap or detergent, using dial soap. Tried OTC hydrocortisone which helps the itching  Note pt had abnormal Cr and fasting Glucose needs repeat BMET and A1C today concern for DM   Review of Systems Per Above     Objective:   Physical Exam  Vitals noted GEN- NAD, alert and oriented CVS- RRR, no murmur HEENT- no carotid bruit RESP- CTAB EXT- no edema Skin- hyperpigmented velvet like rash on bilateral neck, multiple moles, hyperpigmented small circular lesion on neck, no erythema no scaling, no blistering noted  Cervical nodes- none  Phone [phone]  Assessment & Plan:

## 2010-06-02 NOTE — Assessment & Plan Note (Signed)
New diagnosis, discussed results with pt via phone as results came in after visit Will start XL Metformin at 500mg  daily A1C 7.0 Discussed diet during visit  Will send new meter over Currently on ACE Appt in 2 weeks teach pt how to use meter

## 2010-06-22 NOTE — Consult Note (Signed)
Summary: [facility]   Imported By: Florinda Marker 06/16/2010 14:18:53  _____________________________________________________________________  External Attachment:    Type:   Image     Comment:   External Document

## 2010-06-29 ENCOUNTER — Encounter: Payer: Self-pay | Admitting: Family Medicine

## 2010-06-29 ENCOUNTER — Ambulatory Visit (INDEPENDENT_AMBULATORY_CARE_PROVIDER_SITE_OTHER): Payer: BC Managed Care – PPO | Admitting: Family Medicine

## 2010-06-29 DIAGNOSIS — E119 Type 2 diabetes mellitus without complications: Secondary | ICD-10-CM

## 2010-06-29 MED ORDER — METFORMIN HCL ER 500 MG PO TB24: 500.0000 mg | ORAL_TABLET | Freq: Every day | ORAL | Status: DC

## 2010-06-29 NOTE — Assessment & Plan Note (Signed)
New diabetic, appears to have been caught early in the disease, A1C of 7, continue Metformin as now tolerating better Discussed use of meter goal of CBG less than 100 in AM, less than 180 after meals. Discussed how to take CBG- testing on sides of finger, Will also set up diabetes educator Currently on ACEI Weight down 3lbs

## 2010-06-29 NOTE — Patient Instructions (Signed)
We will set you up with the diabetes educator Next visit in 1 month to f/u diabetes Ask the pharmacist for the Vitamin D/Calcium chew that they carry

## 2010-06-29 NOTE — Progress Notes (Signed)
  Subjective:    Patient ID: [name], female    DOB: Nov 24, 1970, 40 y.o.   MRN: [id number]  HPI Diabetes Mellitus- lowest CBG-- 119 in morning fasting    Highest CBG- 200 after eating soda and sub and fries   Started Metformin--  Had some abdominal discomfort- felt more like an ache , no nausea, no diarrhea, she gets this more when she take the medication with food, not as much if she takes it on an empty stomach, past weeks has not had very much trouble taking the medication   Taking blood sugar- fasting and at bedtime Working out now with trainer- would like some help with food choices Needs Metformin refilled   Review of Systems     Objective:   Physical Exam        Assessment & Plan:

## 2010-07-12 ENCOUNTER — Encounter: Payer: BC Managed Care – PPO | Attending: Family Medicine

## 2010-07-12 DIAGNOSIS — Z713 Dietary counseling and surveillance: Secondary | ICD-10-CM | POA: Insufficient documentation

## 2010-07-12 DIAGNOSIS — E119 Type 2 diabetes mellitus without complications: Secondary | ICD-10-CM | POA: Insufficient documentation

## 2010-08-03 ENCOUNTER — Ambulatory Visit: Payer: BC Managed Care – PPO

## 2010-08-10 ENCOUNTER — Ambulatory Visit: Payer: BC Managed Care – PPO

## 2010-09-07 ENCOUNTER — Encounter: Payer: BC Managed Care – PPO | Attending: Family Medicine

## 2010-09-07 DIAGNOSIS — Z713 Dietary counseling and surveillance: Secondary | ICD-10-CM | POA: Insufficient documentation

## 2010-09-07 DIAGNOSIS — E119 Type 2 diabetes mellitus without complications: Secondary | ICD-10-CM | POA: Insufficient documentation

## 2010-09-07 NOTE — Assessment & Plan Note (Signed)
OFFICE VISIT   [name], PORCHER D  DOB:  June 24, 1970                                       03/12/2007  RUEAV#:[id number]   REASON FOR VISIT:  Status post venous stent.   HISTORY:  This is a 40 year old female who has had swelling in her left  leg for greater than 1 year.  Last September, she had increasing edema  in the left thigh and calf and this has persisted with some pigment  changes in her leg.  She has had no ulcers.  She has worn compression  garments with mild improvement.  She has undergone CT scan which did not  show external compression.  She was initially seen by Dr. Hart Rochester and on  02/06/2007 I took her to the angiogram suite for venogram and  subsequently placed a 12 x 60 Cordis S.M.A.R.T. stent in to the left  iliac vein.  She comes back in today for followup.  She said she has had  significant improvement in the swelling in her left leg.  In fact, she  noticed immediate improvement later in the day following stent  placement.  She had a venous duplex performed today that reveals widely  patent iliac vein stent.   EXAM:  Vitals: Afebrile, stable.  General:  well apearing, no acute  distress; Left leg:The swelling has resolved.  There was no ulceration.   PLAN:  She is going to follow up with me in 3 months.  At that time, we  will have a repeat venous duplex to make sure that there is no in-stent  stenosis and that stent is patent.  She is instructed to call me if she  has recurrence of her swelling and if that does occur I would repeat a  venogram.  I have given her a prescription for new compression garments  given that her old ones no longer fit given the decrease in size of her  leg.  Again, she will follow up in 3 months.   Jorge Ny, MD  Electronically Signed   VWB/MEDQ  D:  03/12/2007  T:  03/13/2007  Job:  [id number]

## 2010-09-07 NOTE — Procedures (Signed)
DUPLEX DEEP VENOUS EXAM - LOWER EXTREMITY   INDICATION:  Follow up left external iliac vein stent.   HISTORY:  Edema:  No.  Trauma/Surgery:  Left external iliac vein stent, placed on 02/06/07.  Pain:  No.  PE:  No.  Previous DVT:  No.  Anticoagulants:  Other:   DUPLEX EXAM:                CFV   SFV   PopV  PTV    GSV                R  L  R  L  R  L  R   L  R  L  Thrombosis    o  o     o     o      o     o  Spontaneous   +  +     +     +      +     +  Phasic        +  +     +     +      +     +  Augmentation  +  +     +     +      +     +  Compressible  +  +     +     +      +     +  Competent     0  0     +     +      +     +   Legend:  + - yes  o - no  p - partial  D - decreased   IMPRESSION:  1. No evidence of deep venous thrombosis noted in the left lower      extremity.  2. Patent left external iliac vein stent noted.  3. Reflux is noted at the bilateral common femoral vein levels.    _____________________________  V. Charlena Cross, MD   CH/MEDQ  D:  10/20/2008  T:  10/20/2008  Job:  [id number]

## 2010-09-07 NOTE — Procedures (Signed)
DUPLEX DEEP VENOUS EXAM - LOWER EXTREMITY   INDICATION:  Follow up left external iliac vein stent.   HISTORY:  Edema:  No.  Trauma/Surgery:  Left iliac vein stent.  Pain:  No.  PE:  No.  Previous DVT:  No.  Anticoagulants:  Aspirin.  Other:   DUPLEX EXAM:                CFV   SFV   PopV  PTV    GSV                R  L  R  L  R  L  R   L  R  L  Thrombosis    o  o     o     o      o     o  Spontaneous   +  +     +     +      +     +  Phasic        +  +     +     +      +     +  Augmentation  +  +     +     +      +     +  Compressible  +  +     +     +      +     +  Competent     d  d     +     +      +     +   Legend:  + - yes  o - no  p - partial  D - decreased   IMPRESSION:  1. No evidence of deep venous thrombosis in the left lower extremity      or the right common femoral vein.  2. Patent left external iliac vein stent.  3. Reflux noted in the bilateral femoral veins.  4. No significant change from previous study.    _____________________________  V. Charlena Cross, MD   NT/MEDQ  D:  10/19/2009  T:  10/19/2009  Job:  [id number]

## 2010-09-07 NOTE — Procedures (Signed)
DUPLEX DEEP VENOUS EXAM - LOWER EXTREMITY   INDICATION:  Status post left external iliac vein stenting.   HISTORY:  Edema:  No.  Trauma/Surgery:  Yes.  Pain:  Yes.  PE:  No.  Previous DVT:  No.  Anticoagulants:  Other:   DUPLEX EXAM:                CFV   SFV   PopV  PTV    GSV                R  L  R  L  R  L  R   L  R  L  Thrombosis    0  0  Spontaneous   +  +  Phasic        +  +  Augmentation  +  +  Compressible  +  +  Competent     +  +   Legend:  + - yes  o - no  p - partial  D - decreased   IMPRESSION:  Inferior vena cava and external iliac vein with no evidence  of deep venous thrombosis.     _____________________________  V. Charlena Cross, M.D.   VWB/MEDQ  D:  03/12/2007  T:  03/13/2007  Job:  [id number]

## 2010-09-07 NOTE — Assessment & Plan Note (Signed)
OFFICE VISIT   [name], [name]  DOB:  April 01, 1971                                       04/13/2009  [id number]#:[id number]   REASON FOR VISIT:  Followup.   HISTORY:  This is a 40 year old female who underwent stenting of a left  iliac vein stenosis on 04/08/07.  This was done using a Cordis 12 x 60  stent.  She has been doing well in her follow-up with serial ultrasounds  which have yet to show end-stent restenosis.  She continues to be  without swelling.  She did have some pain in her left foot today;  however, this was self-limited.   PHYSICAL EXAMINATION:  Heart rate is 108, blood pressure is 121/86,  temperature is 98.  GENERAL:  Well-appearing in no distress.  RESPIRATIONS:  Nonlabored.  ABDOMEN:  Soft, nontender.  MUSCULOSKELETAL:  No major deformities.  SKIN:  Without rash.  EXTREMITIES:  Palpable  pedal pulses.  No significant edema.   DIAGNOSTIC STUDIES:  Ultrasound was independently reviewed today that  shows no evidence of DVT.  The left iliac vein stent is widely patent.  There is bilateral common femoral vein reflux which is without  significant change.   ASSESSMENT/PLAN:  Status post left iliac stent.  The patient continues  to be without symptoms.  Will continue with our P scan protocol.  I will  see her back in a year.  She will call me in the interim if she develops  problems.     Suzanne Ny, MD  Electronically Signed   VWB/MEDQ  [name]:  04/13/2009  T:  04/14/2009  Job:  [id number]   cc:   [name], M.[name].

## 2010-09-07 NOTE — Consult Note (Signed)
VASCULAR SURGERY CONSULTATION   [name], [name]  DOB:  [birth year]                                       01/16/2007  [id number]#:[id number]   Patient is a healthy 40 year old female who has been having swelling in  the left leg for one year.  Last September, she noticed increasing edema  in the left thigh and calf and that has persisted over the past 12  months with some recent darkening of the skin in the lower third of the  leg.  She has had no venous stasis ulcers or history of deep venous  thrombosis, thrombophlebitis, pulmonary emboli, or other thrombotic  problems.  She has not worn true elastic compression stockings but has  worn some support hose with minor improvement.  She has elevated her  legs sometimes at night but not on a regular basis.  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  It was not the opinion of the radiologist that this  represented a neoplastic process.   PAST MEDICAL HISTORY:  Hypertension.  Negative for diabetes, coronary  artery disease, COPD, or stroke.   PAST SURGICAL HISTORY:  Hysterectomy, as noted.   FAMILY HISTORY:  Positive for diabetes in her mother.  Positive for  coronary artery disease and stroke in her grandmother.   SOCIAL HISTORY:  She is single and has one child.  Works at [employer]  She  does not use tobacco or alcohol.   REVIEW OF SYSTEMS:  Please see health history form.   MEDICATIONS:  Please see health history form.   ALLERGIES:  None known.   PHYSICAL EXAMINATION:  Blood pressure is 140/110, heart rate 64,  respirations 16.  Generally, she is an obese female patient who is in no  apparent distress.  She is alert and oriented x3.  Her neck is supple  with 3+ carotid pulses palpable.  No bruits are audible.  Neurologic  exam is normal.  No palpable adenopathy in the neck.  Upper extremity  pulses are 3+ bilaterally.  Chest:  Clear to auscultation.  Cardiovascular exam reveals a regular rhythm with no murmurs.  Abdomen  is soft, nontender, with no palpable masses.  She has 3+ femoral  popliteal and dorsalis pedis pulses bilaterally.  Left leg is diffusely  edematous with 4 cm larger in the ankle and 2 cm larger in the distal  thigh and some darkening of the skin in the lower third of the leg.  There are no active ulcers or infection.  The right leg is free of  edema.   I suspect she did have thrombophlebitis in her external iliac vein.  Since it is possibly stenotic and not occluded, she may be a candidate  for a stent in the venous system, although this is not a commonly  performed procedure.  We will arrange for her to have a venogram by Dr.  Durene Cal IV on Tuesday, the 14th of October, to see if there are  any interventional options available.  She was also instructed on  wearing elastic compression stockings (long leg 20-30 compression) and  elevating her leg properly during the night to treat these symptoms.   Quita Skye Hart Rochester, M.[name].  Electronically Signed  JDL/MEDQ  [name]:  01/16/2007  T:  01/17/2007  Job:  [id number]   cc:   Tresa Endo L. Philipp Deputy, M.[name].

## 2010-09-07 NOTE — Procedures (Signed)
DUPLEX DEEP VENOUS EXAM - LOWER EXTREMITY   INDICATION:  Followup left external iliac vein stent   HISTORY:  Edema:  no  Trauma/Surgery:  Left external iliac vein stent 02/06/2007  Pain:  Minimal left foot pain  PE:  no  Previous DVT:  Previous left EIV stenosis prior to stent  Anticoagulants:  no  Other:   DUPLEX EXAM:                CFV   SFV   PopV  PTV    GSV                R  L  R  L  R  L  R   L  R  L  Thrombosis    0  0     0     0      0     0  Spontaneous   +  +     +     +      +     +  Phasic        +  +     +     +      +     +  Augmentation  +  +     +     +      +     +  Compressible  +  +     +     +      +     +  Competent     0  0     +     +      +     +   Legend:  + - yes  o - no  p - partial  D - decreased   IMPRESSION:  1. No evidence of deep venous thrombosis in the left lower extremity      or right common femoral vein.  2. Patent left external iliac vein stent.  3. Reflux in bilateral common femoral veins noted.  4. No significant changes from previous study.     _____________________________  V. Charlena Cross, MD   AS/MEDQ  D:  04/13/2009  T:  04/14/2009  Job:  [id number]

## 2010-09-07 NOTE — Assessment & Plan Note (Signed)
OFFICE VISIT   DORINNE, GRAEFF D  DOB:  04/12/1971                                       11/12/2007  EAVWU#:[id number]   REASON FOR VISIT:  Followup left leg venous stent.   HISTORY:  This is a 40 year old female who has had left leg swelling for  over a year.  She had minimal improve with compression stockings.  She  underwent a CT scan which did not show external compression.  She was  taken for a venogram on 04/08/2007 and found to have a left iliac vein  stenosis.  This was treated with a 12 x 60 Cordis Smart Stent.  She has  been coming in every 3 months for followup.  Her swelling has resolved.  She has had no complaints with swelling.   EXAM:  Her blood pressure is 170/114, pulse 94.  General:  She is well  appearing in no acute distress.  Cardiovascular:  Regular rate and  rhythm.  Left leg does show any evidence of edema.  She has a palpable  dorsalis pedis pulse.   DIAGNOSTIC STUDIES:  The left iliac vein stent is patent with no  evidence of stenosis or DVT.   ASSESSMENT/PLAN:  Status post left iliac vein stent.   PLAN:  The patient is placed on surveillance protocol which will be an  ultrasound every 3 months for the first year, and every 6 months for the  second year.  She is due to have a scan in 3 months and 6 months from  now she will follow up with me.  I have told her that if she develops  any evidence of swelling, to contact us and not wait until her next  appointment.  Again, I will see her in 6 months.  Intervene if she has  trouble prior before that.   Jorge Ny, MD  Electronically Signed   VWB/MEDQ  D:  11/12/2007  T:  11/13/2007  Job:  [id number]

## 2010-09-07 NOTE — Op Note (Signed)
NAMEWILSIE, KERN                ACCOUNT NO.:  [id number]   MEDICAL RECORD NO.:  [id number]          PATIENT TYPE:  AMB   LOCATION:  SDS                          FACILITY:  [facility]   PHYSICIAN:  VDurene Cal IV, MDDATE OF BIRTH:  22-Nov-1970   DATE OF PROCEDURE:  02/06/2007  DATE OF DISCHARGE:  02/06/2007                               OPERATIVE REPORT   PREOPERATIVE DIAGNOSIS:  Left leg swelling.   POSTOPERATIVE DIAGNOSIS:  Left leg swelling.   PROCEDURE PERFORMED:  1. Ultrasound guided access of the left common femoral vein.  2. Conscious sedation from 724 to 824.  3. Iliac/inferior vena cava venogram.  4. Stent left iliac vein.  5. Administration of nitroglycerin.   INDICATIONS FOR PROCEDURE:  This is a 40 year old female who has  developed left leg swelling.  She has a CT scan which identified  external iliac vein stenosis, no mass effect was seen.  She has tried  conservative therapy with compression stockings and this has improved  her symptoms.  Risks and benefits of performing a percutaneous  intervention were discussed with the patient and informed consent was  signed.   PROCEDURE:  The patient was identified in the holding area and taken to  room 8.  She is placed supine on the table.  Bilateral groins were  prepped and draped in a standard, sterile fashion.  Lidocaine 1% was  used for local anesthesia.  Using ultrasound, the left common femoral  vein was accessed with an 18 gauge needle.  An 0.035 wire was advanced  with resistance encountered in the mid iliac venous system.  A 5-French  sheath was placed.  The wire was removed and an iliac/IVC venogram was  performed.   FINDINGS:  There was a high grade stenosis within the left external  iliac vein.  There was retrograde filling of a large collateral, which  fills the iliac and venous system as well as the IVC.  There are no  other lesions identified.   Next, a Glidewire was inserted through the 5-French  sheath and easily  traversed the lesion.  A marker pigtail was then advanced over the guide  wire and used for measurement as well as wire exchanged.  An 0.035  Wholey wire was then placed.  Next, an 8-French sheath was used to  replace the 5-French sheath.  The patient was systemically heparinized.  The lesion was predilated using a 10 x 4 balloon.  This caused a fair  amount of  pain and discomfort upon insufflation.  We were able to take  the balloon to 8 atmospheres.  There was a residual waste, however, and  elected to stent the lesion at this point in time.  Based on  preoperative imaging, I felt a 12-mm stent would be good diameter for  appropriate oversizing.  The 12 x 60 Cordis Smart Stent was advanced  over the wire and placed into position.  Several contrast images were  taken in multiple obliquities to identify the correct location for the  stent.  The stent was successfully deployed.  The stent was then  molded  to confirmation using a 12 x 2 balloon.  There was an area of luminal  narrowing within the stent itself, however, this was not responsive to  balloon dilation and this also did not appear to be hemodynamically  significant.  There also appeared on completion runs to be a spasm in  the external iliac vein.  For that reason, a  total of 500 mcg of  nitroglycerin were administered.  Following completion, a venogram was  performed which showed widely patent stent.  The collateral which was  previously seen no longer filled.  The central venous system was found  to be widely patent.  After the above findings, the decision was made to  terminate the procedure.  The patient will be taken to the holding area  for sheath removal following correction of the coagulation profile.   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      Jorge Ny, MD  Electronically Signed     VWB/MEDQ  D:   02/06/2007  T:  02/06/2007  Job:  [id number]

## 2010-09-07 NOTE — Procedures (Signed)
DUPLEX DEEP VENOUS EXAM - LOWER EXTREMITY   INDICATION:  Follow up left external iliac vein stenting.   HISTORY:  Edema:  Yes.  Trauma/Surgery:  Yes.  Pain:  No.  PE:  No.  Previous DVT:  No.  Anticoagulants:  Other:   DUPLEX EXAM:                CFV   SFV   PopV  PTV    GSV                R  L  R  L  R  L  R   L  R  L  Thrombosis    o  o     o     o      o     o  Spontaneous   +  +     +     +      +     +  Phasic        +  +     +     +      +     +  Augmentation  +  +     +     +      +     +  Compressible  +  +     +     +      +     +  Competent     +  +     +     +      +     +   Legend:  + - yes  o - no  p - partial  D - decreased   IMPRESSION:  Patent left external iliac vein stent with no evidence of  deep venous thrombosis in the left leg.    _____________________________  V. Charlena Cross, MD   MG/MEDQ  D:  11/12/2007  T:  11/12/2007  Job:  [id number]

## 2010-09-07 NOTE — Procedures (Signed)
DUPLEX DEEP VENOUS EXAM - LOWER EXTREMITY   INDICATION:  Follow-up left external iliac vein stent.   HISTORY:  Edema:  No.  Trauma/Surgery:  Yes.  Pain:  No.  PE:  No.  Previous DVT:  No.  Anticoagulants:  No.  Other:   DUPLEX EXAM:                CFV   SFV   PopV  PTV    GSV                R  L  R  L  R  L  R   L  R  L  Thrombosis    0  0     0     0      0     0  Spontaneous   +  +     +     +      +     +  Phasic        +  +     +     +      +     +  Augmentation  +  +     +     +      +     +  Compressible  +  +     +     +      +     +  Competent     +  +     +     +      +     +   Legend:  + - yes  o - no  p - partial  D - decreased   IMPRESSION:  1. Duplex shows no evidence of deep or superficial vein thrombosis of      the left lower extremity.  2. Patent left external iliac vein stent with no evidence of venous      thrombosis.    _____________________________  V. Charlena Cross, MD   AC/MEDQ  D:  04/21/2008  T:  04/21/2008  Job:  [id number]

## 2010-09-10 NOTE — Discharge Summary (Signed)
[name], [name]                          ACCOUNT NO.:  [id number]   MEDICAL RECORD NO.:  [id number]                   PATIENT TYPE:  INP   LOCATION:  9303                                 [facility]   PHYSICIAN:  Tanya S. Shawnie Pons, M.D.                DATE OF BIRTH:  10/10/1970   DATE OF ADMISSION:  07/28/2003  DATE OF DISCHARGE:                                 DISCHARGE SUMMARY   FINAL DIAGNOSES:  1. Fibroid uterus.  2. Menorrhagia.  3. Anemia.  4. Pelvic pain.  5. Hypertension.   PROCEDURES:  Transvaginal hysterectomy.   REASON FOR ADMISSION:  Briefly, the patient is a 40 year old gravida 1 para  1 who has a fibroid uterus, menorrhagia, and anemia who desires permanent  sterilization.  She has brittle hypertension and is not a good candidate for  hormonal therapy and therefore desired hysterectomy.   HOSPITAL COURSE:  The patient was admitted on the day of surgery and  underwent a transvaginal hysterectomy.  She tolerated the procedure well and  postoperatively was transferred to the third floor.  She had a vaginal  packing and Foley catheter in overnight which were removed on postoperative  day #1.  Through the night between postoperative day #0 and postoperative  day #1 the patient had a temperature spike to 100.9.  On postoperative day  #1 after the vaginal packing and Foley were removed the patient was  transferred to a regular diet and her IV pain medicines were stopped.  She  had no nausea or vomiting during this time.  She remained afebrile for 24  hours from postoperative day #1 to postoperative day #2.  As she was  tolerating a regular diet, voiding well on her own, and her pain was well  controlled, it was felt she was stable for discharge.   DISCHARGE DISPOSITION AND CONDITION:  The patient discharged home in good  condition.   PERTINENT DISCHARGE INSTRUCTIONS:  1. Discharge medications:  Percocet one to two p.o. q.4-6h. p.r.n. pain and     then she will  resume her home blood pressure medications as she was     taking in the hospital.  2. Follow-up will be in 2 weeks at the GYN clinic.  3. Diet:  Regular.  4. Restrictions include pelvic rest for the next 6 weeks, no heavy lifting     for 2 weeks.  5. Other instructions:  The patient is instructed to return with fever,     persistent nausea and vomiting, and she is instructed to call with other     concerns.                                               Shelbie Proctor. Shawnie Pons, M.D.    TSP/MEDQ  D:  07/30/2003  T:  07/30/2003  Job:  [id number]

## 2010-09-10 NOTE — Group Therapy Note (Signed)
NAME:  [name], [name]                          ACCOUNT NO.:  [id number]   MEDICAL RECORD NO.:  [id number]                   PATIENT TYPE:  OUT   LOCATION:  [facility]                           FACILITY:  [facility]   PHYSICIAN:  Tinnie Gens, MD                     DATE OF BIRTH:  [birth year]   DATE OF SERVICE:  05/29/2003                                    CLINIC NOTE   CHIEF COMPLAINT:  Follow-up fibroids.   HISTORY OF PRESENT ILLNESS:  The patient is a 40 year old gravida 1 para 1-0-  0-1 who is status post BLT who has known fibroid uterus that has not  significantly changed by ultrasound.  However, the patient wants a  hysterectomy.  She also has history of abnormal Paps which have been  followed.  Her most recent one showed benign reparative changes, no  intraepithelial lesions.  She has a couple of medical problems for which she  is followed at [facility].  Notably is hypertension and her blood pressure  is up today.  She denies headache or numbness or tingling.   EXAMINATION TODAY:  Her blood pressure is 182/122, recheck is 202/126.  She  is in no acute distress.  Ultrasound findings are reviewed, shows three  fibroids.  One is 4 x 4 x 3 cm in the anterior upper uterine segment, a  second one is in the anterior upper uterine segment measuring 2 x 1 x 1, and  a third in the anterior fundal region measuring 1 x 1 x 1.   IMPRESSION:  1. Fibroid uterus.  2. Urgent hypertension.   PLAN:  1. The patient will return in 2-3 weeks for a preoperative visit for     discussion of hysterectomy.  Whether this is vaginal or abdominal related     to a previous C-section can be worked out at that time.  2. Due to the severity of her hypertension today a follow-up appointment is     made at [facility] on next Tuesday.  Additionally, she was sent to     [facility] ER for evaluation and treatment of hypertension today.                                               Tinnie Gens, MD    TP/MEDQ   D:  05/29/2003  T:  05/30/2003  Job:  [id number]

## 2010-09-10 NOTE — Group Therapy Note (Signed)
NAME:  [name], [name]                          ACCOUNT NO.:  [id number]   MEDICAL RECORD NO.:  [id number]                   PATIENT TYPE:  OUT   LOCATION:  [facility]                           FACILITY:  [facility]   PHYSICIAN:  Tinnie Gens, MD                     DATE OF BIRTH:  1971/01/01   DATE OF SERVICE:  04/24/2003                                    CLINIC NOTE   CHIEF COMPLAINT:  Abnormal Pap.   HISTORY OF PRESENT ILLNESS:  The patient is a 40 year old gravida 1 para 1  who has history of abnormal Pap, essentially a CIN 1, who underwent colpo  biopsy that only showed chronic cervicitis.  She is here for repeat Pap.   She is also complaining today of pelvic pain with menses and up to two weeks  later.  She reports that she had known fibroid uterus and would like to know  when something could be done about this.   She also has a longstanding history of hypertension and she has been seeing  [facility] on a regular basis who are adjusting her medications.   PHYSICAL EXAMINATION:  VITAL SIGNS:  Vital signs are as noted.  Blood  pressure is 160/108.  GENERAL:  She is a moderately obese black female in no acute distress.  PELVIC:  She has normal external female genitalia.  The vagina is rugated.  There is a thin yellow discharge noted.  The cervix is visualized.  Pap  smear is taken.  On bimanual the uterus is irregular and enlarged.  The  adnexa cannot be adequately delineated given the patient's body habitus as  well as tenderness.   IMPRESSION:  1. Abnormal Pap.  2. Pelvic pain.  3. Question of fibroid uterus.   PLAN:  1. Pap smear today.  2. Ultrasound to delineate her problems.  Will address these issues when she     returns in one month for follow-up.                                               Tinnie Gens, MD    TP/MEDQ  D:  04/24/2003  T:  04/24/2003  Job:  [id number]

## 2010-09-10 NOTE — Op Note (Signed)
NAME:  [name], [name]                          ACCOUNT NO.:  [id number]   MEDICAL RECORD NO.:  [id number]                   PATIENT TYPE:  OBV   LOCATION:  9303                                 [facility]ILITY[facility]   PHYSICIAN:  Tanya S. Shawnie Pons, M.D.                DATE OF BIRTH:  [birth year]   DATE OF PROCEDURE:  07/28/2003  DATE OF DISCHARGE:                                 OPERATIVE REPORT   PREOPERATIVE DIAGNOSES:  1. Fibroid uterus.  2. Menorrhagia.  3. Anemia.   POSTOPERATIVE DIAGNOSES:  1. Fibroid uterus.  2. Menorrhagia.  3. Anemia.   PROCEDURE:  Transvaginal hysterectomy.   SURGEON:  Shelbie Proctor. Shawnie Pons, M.D.   ASSISTANT:  Lesly Dukes, M.D.   ANESTHESIA:  General endotracheal anesthesia per Raul Del, M.D.   FINDINGS:  Fibroid uterus.   ESTIMATED BLOOD LOSS:  250 mL.   COMPLICATIONS:  None.   SPECIMENS:  Uterus.   INDICATIONS FOR PROCEDURE:  Briefly, the patient is a 40 year old gravida 1,  para 1-0-0-1 who has known fibroid uterus and menorrhagia.  She desired  definitive treatment and because of her poorly controlled  hypertension, was  not a good candidate for hormonal therapy.  The patient was counseled on the  risks and benefits of this procedure including damage to surrounding  structures, bladder, ureters, bowel; as well as bleeding, infection, and  possibly death.  The patient understood these risks.  Consents were signed  on the chart prior to proceeding.   DESCRIPTION OF PROCEDURE:  The patient was taken to the OR where she was  placed in dorsal lithotomy position and Allen stirrups.  She was then  prepped and draped in the usual sterile fashion.  Catheter used to drain her  bladder.  Weighted speculum placed inside the vagina as well as a narrowed  Deaver until the cervix was visualized and grasped with double tooth  tenaculum on the anterior and posterior lips.  Cervix then infiltrated with  10 mL of 1% lidocaine with epinephrine.  A  circumferential incision was made  around the cervix and the vagina pushed up off of the cervix.  Initially  anterior peritoneum was entered sharply with Metzenbaum scissors and a  Deaver placed inside the peritoneal cavity.  Posteriorly, the incision was  made into the uterus initially, then through the peritoneum and a long  weighted speculum placed there.  The uterosacrals were bilaterally clamped,  cut and suture ligated with Heaney type suturing with 0 Vicryl suture.  Bilaterally, these were tagged and held.  Sequential bites were taken of the  uterus where the uterine arteries were clamped, cut, and Heaney suture  ligated until the posterior portion of the uterus could be delivered.  However, it was difficult delivering it from the vagina and then decision  was made to core the uterus.  The uterus was then cored and the anterior  fibroid  found that was approximately 4 to 5 cm in greatest length.  This was  removed as well as the cord section of cervix until the tubo-ovarian pedicle  could be reached on the patient's left.  It was done in a single bite and  the uterus freed from the pedicle.  The free tie was then placed about this  pedicle followed by suture ligature in the Heaney type fashion.  Good  hemostasis was noted and this pedicle was also held.  Similarly, the right  pedicle which had to be taken in two bites was taken and the uterus freed  and delivered from the vagina.  Each pedicle was then secured with a free  tie followed by suture ligature in the Heaney fashion.  There was good  hemostasis and the pedicle was held as well.  Once the uterus was removed,  both pedicles were inspected and found to be hemostatic and suture cut.  The  pedicles were examined from the length down to the uterosacrals and there  was good hemostasis.  The only bleeding was from the vaginal cuff.  Suture  was then used to grasp the patient's left uterosacral ligament.  It was tied  down here. The  posterior cuff run in a locked running fashion to the right  uterosacral ligament which was also affixed with a suture.  This was tied to  the uterosacral pedicle on the patient's right followed by the tying of  these two pedicles together so that uterosacrals were tied in the middle.  The rest of the vaginal cuff was then closed in a running #1 Vicryl suture  starting from the apex and going down.  There was minimal bleeder at the  posterior edge of the incision which was cauterized with the electrocautery.  Good hemostasis was noted at this point.  A Foley catheter was then placed  inside the bladder and clear, yellow urine noted.  The vagina was then  packed with Premarin coated one inch vaginal packing for hemostasis.  The  patient tolerated the procedure well.  All instrument, needle and lap counts  correct x2.  The patient was awakened and taken to the recovery room in  stable condition.                                               Shelbie Proctor. Shawnie Pons, M.D.    TSP/MEDQ  D:  07/28/2003  T:  07/28/2003  Job:  [id number]

## 2010-10-01 ENCOUNTER — Other Ambulatory Visit: Payer: Self-pay | Admitting: Family Medicine

## 2010-10-01 NOTE — Telephone Encounter (Signed)
Refill request

## 2010-10-21 ENCOUNTER — Encounter: Payer: Self-pay | Admitting: Family Medicine

## 2010-10-21 ENCOUNTER — Ambulatory Visit (INDEPENDENT_AMBULATORY_CARE_PROVIDER_SITE_OTHER): Payer: BC Managed Care – PPO | Admitting: Family Medicine

## 2010-10-21 DIAGNOSIS — R51 Headache: Secondary | ICD-10-CM

## 2010-10-21 DIAGNOSIS — E119 Type 2 diabetes mellitus without complications: Secondary | ICD-10-CM

## 2010-10-21 DIAGNOSIS — I10 Essential (primary) hypertension: Secondary | ICD-10-CM

## 2010-10-21 DIAGNOSIS — R12 Heartburn: Secondary | ICD-10-CM

## 2010-10-21 LAB — POCT GLYCOSYLATED HEMOGLOBIN (HGB A1C): Hemoglobin A1C: 6.6

## 2010-10-21 MED ORDER — LISINOPRIL-HYDROCHLOROTHIAZIDE 20-25 MG PO TABS: 1.0000 | ORAL_TABLET | Freq: Every day | ORAL | Status: DC

## 2010-10-21 MED ORDER — FLUTICASONE PROPIONATE 50 MCG/ACT NA SUSP: 2.0000 | Freq: Every day | NASAL | Status: DC

## 2010-10-21 MED ORDER — AMLODIPINE BESYLATE 5 MG PO TABS: ORAL_TABLET | ORAL | Status: DC

## 2010-10-21 MED ORDER — RANITIDINE HCL 150 MG PO TABS: 150.0000 mg | ORAL_TABLET | Freq: Two times a day (BID) | ORAL | Status: DC

## 2010-10-21 MED ORDER — METFORMIN HCL ER 500 MG PO TB24: 500.0000 mg | ORAL_TABLET | Freq: Every day | ORAL | Status: DC

## 2010-10-21 NOTE — Progress Notes (Signed)
  Subjective:    Patient ID: [name], female    DOB: 02/16/1971, 40 y.o.   MRN: [id number]  HPI  DM- diagnosed approx 6 months ago, A1 was 7.0, takes CBG approx 2-3x a week, typically fasting numbers 113-160, Tolertating Metformin once a day   Heartburn- has a sour stomach at night and occ in AM, , feels the food moving up, occ burning sensation in chest, was taking TUMS which helped some   Headaches- has headaches in center of face over nasal bridge, unsure if this is from heat or from recent dental work. Headaches since April when she started dental work, headaches occur occur around lunchtime, pain not described as thumbing,no vision changes, no photophobia, no phobobia. +itchy, runny eyes associated, occ rhinorrhea   Review of Systems per above     Objective:   Physical Exam   GEN- NAD, obese, alert and oriented   HEENT- PERRL, EOMI, MMM, oropharynx clear, pressure over nasal bridge, no pain at TMJ, no effusion seen in ears bilat, clear sclera, non injected conjunctiva  CVS- RRR, no murmur  RESP- CTAB  ABD- NABS, soft, NT, ND  EXT- no edema, pulses 2+       Assessment & Plan:   1. DM- at goal, prefer pt to be closer to 6.5, continue Metformin  2. Heartburn- trial of H2 blocker  3. HA- does not appear to be migraine related, question if this is allergies, doubt sinus infection. Treat with flonase 4.HTN- at goal

## 2010-10-21 NOTE — Patient Instructions (Signed)
Next visit in 3 months Try the Zantac for your stomach Use the flonase for the nasal pressure and allergies Continue the Metformin Continue with work out program

## 2010-11-24 ENCOUNTER — Ambulatory Visit (INDEPENDENT_AMBULATORY_CARE_PROVIDER_SITE_OTHER): Payer: BC Managed Care – PPO | Admitting: Family Medicine

## 2010-11-24 DIAGNOSIS — K529 Noninfective gastroenteritis and colitis, unspecified: Secondary | ICD-10-CM

## 2010-11-24 DIAGNOSIS — K5289 Other specified noninfective gastroenteritis and colitis: Secondary | ICD-10-CM

## 2010-11-24 DIAGNOSIS — K648 Other hemorrhoids: Secondary | ICD-10-CM

## 2010-11-24 MED ORDER — HYDROCORTISONE ACETATE 25 MG RE SUPP: 25.0000 mg | Freq: Two times a day (BID) | RECTAL | Status: AC

## 2010-11-24 MED ORDER — ONDANSETRON HCL 4 MG PO TABS: 4.0000 mg | ORAL_TABLET | Freq: Every day | ORAL | Status: DC | PRN

## 2010-11-24 MED ORDER — LOPERAMIDE HCL 2 MG PO TABS: 2.0000 mg | ORAL_TABLET | Freq: Four times a day (QID) | ORAL | Status: AC | PRN

## 2010-11-24 NOTE — Patient Instructions (Signed)
For your hemorrhoids, try the suppositories. Sitz baths are also helpful.   For your diarrhea, try the loperamide. Take 2 tablets the first time, then 1 tablet every 4 hours as needed. Do not take more than 8 tablets a day.

## 2010-11-24 NOTE — Assessment & Plan Note (Signed)
More bothersome with frequent episodes of diarrhea. Not thrombosed. Manage with steroids and recommended Sitz baths for now.

## 2010-11-24 NOTE — Progress Notes (Signed)
  Subjective:    Patient ID: [name], female    DOB: 02/15/1971, 40 y.o.   MRN: [id number]  HPI Diarrhea. Started yesterday around 9am. Several episodes (5-10) watery, non-bloody diarrhea. Decreased food intake due to nausea and abdominal pain. No vomiting.  Tolerating liquids.  Denies eating any raw, picnic type foods but did eat at [employer] the day before.  No one else in her family is sick (lives with daughter). No one else at [employer] besides her.  Has not taken anything for pain.  Hemorrhoids have been bothering her with all of these episodes. Moderately painful. Does not itch.   Review of Systems Denies dizziness.     Objective:   Physical Exam Gen: somewhat uncomfortable appearing Mouth: moist MM CV: not tachycardic Abd: decreased-NABS, nontender throughout, obese Ext: 3-4 sec capillary refill Rectal: non-thrombosed internal hemorrhoids, pink    Assessment & Plan:

## 2010-11-24 NOTE — Assessment & Plan Note (Signed)
Appears to be typical gastroenteritis, maybe from eating out at [employer] the day before yesterday. Appears to be decently hydrated. Will treat symptomatically. Encouraged drinking lots of fluids. Patient tolerating PO liquids. Given red flags: worsening abdominal pain, blood in stool, fevers to call clinic or go to Urgent Care/ED.

## 2011-01-20 LAB — POCT I-STAT, CHEM 8
BUN: 9
Calcium, Ion: 1.16
Chloride: 103
Sodium: 141

## 2011-01-28 LAB — GLUCOSE, CAPILLARY: Glucose-Capillary: 98 mg/dL (ref 70–99)

## 2011-02-03 LAB — CBC
HCT: 43
Hemoglobin: 14.5
RBC: 5.07
WBC: 6.4

## 2011-02-03 LAB — COMPREHENSIVE METABOLIC PANEL
Alkaline Phosphatase: 68
BUN: 6
Chloride: 104
Glucose, Bld: 89
Potassium: 4.1
Total Bilirubin: 0.8

## 2011-02-03 LAB — PROTIME-INR
INR: 1
Prothrombin Time: 13

## 2011-02-10 ENCOUNTER — Ambulatory Visit (INDEPENDENT_AMBULATORY_CARE_PROVIDER_SITE_OTHER): Payer: BC Managed Care – PPO | Admitting: Family Medicine

## 2011-02-10 ENCOUNTER — Other Ambulatory Visit: Payer: Self-pay

## 2011-02-10 ENCOUNTER — Ambulatory Visit (HOSPITAL_COMMUNITY)
Admission: RE | Admit: 2011-02-10 | Discharge: 2011-02-10 | Disposition: A | Discharge status: Home / Self Care | Payer: BC Managed Care – PPO | Source: Ambulatory Visit | Attending: Family Medicine | Admitting: Family Medicine

## 2011-02-10 ENCOUNTER — Encounter: Payer: Self-pay | Admitting: Family Medicine

## 2011-02-10 VITALS — BP 134/84 | HR 80 | Temp 98.6°F | Ht 63.5 in | Wt 224.0 lb

## 2011-02-10 DIAGNOSIS — R079 Chest pain, unspecified: Secondary | ICD-10-CM

## 2011-02-10 DIAGNOSIS — M940 Chondrocostal junction syndrome [Tietze]: Secondary | ICD-10-CM | POA: Insufficient documentation

## 2011-02-10 DIAGNOSIS — E669 Obesity, unspecified: Secondary | ICD-10-CM

## 2011-02-10 DIAGNOSIS — I10 Essential (primary) hypertension: Secondary | ICD-10-CM

## 2011-02-10 DIAGNOSIS — R9431 Abnormal electrocardiogram [ECG] [EKG]: Secondary | ICD-10-CM | POA: Insufficient documentation

## 2011-02-10 DIAGNOSIS — Z23 Encounter for immunization: Secondary | ICD-10-CM

## 2011-02-10 LAB — COMPREHENSIVE METABOLIC PANEL
ALT: 17 U/L (ref 0–35)
Albumin: 4.1 g/dL (ref 3.5–5.2)
Alkaline Phosphatase: 57 U/L (ref 39–117)
Glucose, Bld: 108 mg/dL — ABNORMAL HIGH (ref 70–99)
Potassium: 3.9 mEq/L (ref 3.5–5.3)
Sodium: 138 mEq/L (ref 135–145)
Total Protein: 7.6 g/dL (ref 6.0–8.3)

## 2011-02-10 LAB — CBC
Hemoglobin: 12.9 g/dL (ref 12.0–15.0)
MCH: 28.4 pg (ref 26.0–34.0)
MCHC: 33.7 g/dL (ref 30.0–36.0)
MCV: 84.2 fL (ref 78.0–100.0)
Platelets: 265 10*3/uL (ref 150–400)

## 2011-02-10 LAB — LIPID PANEL
Cholesterol: 151 mg/dL (ref 0–200)
HDL: 36 mg/dL — ABNORMAL LOW
LDL Cholesterol: 100 mg/dL — ABNORMAL HIGH (ref 0–99)
Total CHOL/HDL Ratio: 4.2 ratio
Triglycerides: 75 mg/dL
VLDL: 15 mg/dL (ref 0–40)

## 2011-02-10 LAB — TSH: TSH: 2.174 u[IU]/mL (ref 0.350–4.500)

## 2011-02-10 MED ORDER — KETOROLAC TROMETHAMINE 60 MG/2ML IJ SOLN
30.0000 mg | Freq: Once | INTRAMUSCULAR | Status: AC
  Administered 2011-02-10: 30 mg via INTRAMUSCULAR

## 2011-02-10 NOTE — Patient Instructions (Signed)
It was good to see you today  You have costochonditis Because you have risk factors, I would like to set you up for a stress test to look at your heart I am also checking some lab work Come back to see me in 1-2 months, Call with any questions,  God Bless, Doree Albee MD   Costochondritis Costochondritis (Tietze syndrome), or costochondral separation, is a swelling and irritation (inflammation) of the tissue (cartilage) that connects your ribs with your breastbone (sternum). It may occur on its own (spontaneously), through damage caused by an accident (trauma), or simply from coughing or minor exercise. It may take up to 6 weeks to get better and longer if you are unable to be conservative in your activities. HOME CARE INSTRUCTIONS   Avoid exhausting physical activity. Try not to strain your ribs during normal activity. This would include any activities using chest, belly (abdominal), and side muscles, especially if heavy weights are used.   Use ice for 15 to 20 minutes per hour while awake for the first 2 days. Place the ice in a plastic bag, and place a towel between the bag of ice and your skin.   Only take over-the-counter or prescription medicines for pain, discomfort, or fever as directed by your caregiver.  SEEK IMMEDIATE MEDICAL CARE IF:   Your pain increases or you are very uncomfortable.   You have a fever.   You develop difficulty with your breathing.   You cough up blood.   You develop worse chest pains, shortness of breath, sweating, or vomiting.   You develop new, unexplained problems (symptoms).  MAKE SURE YOU:   Understand these instructions.   Will watch your condition.   Will get help right away if you are not doing well or get worse.  Document Released: 01/19/2005 Document Revised: 12/22/2010 Document Reviewed: 11/28/2007 [facility] Patient Information 2012 [place], [place].

## 2011-02-10 NOTE — Assessment & Plan Note (Signed)
History and exam consistent with costochondritis. Will treat with NSAIDs. Given cardiovascular RFs, will also set up tp for outpt stress test. Overall EKG reassuring. Will also obtain risk stratification labs.

## 2011-02-10 NOTE — Progress Notes (Signed)
  Subjective:    Patient ID: [name], female    DOB: [birth year], 40 y.o.   MRN: [id number]  HPI Pt is for follow of UC visit this weekend. Pt was seen over the weekend at [facility] for fatigue and chest pain. Overall workup relatively unclear, but pt states that cardiac source of chest pain was not a concern. A1C done at [facility] was 6.6 per pt.   Today pt states that she has had persistence of generalized fatigue and chest pain.  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 No fever, rash, rhinorrhea, nasal congestion. CP is described as central in nature, without radiation to jaw or neck. Pain worse with strenuous activity. 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  + Cardiovascular RFs include: HTN, DM, Obesity  Review of Systems See HPI, otherwise 12 point ROS negative   Objective:   Physical Exam Gen: up in chair, NAD, obese  HEENT: NCAT, EOMI, TMs clear bilaterally CV: RRR, no murmurs auscultated, + anterior chest wall tenderness PULM: CTAB, no wheezes, rales, rhoncii ABD: S/NT/+ bowel sounds  EXT: 2+ peripheral pulses   EKG: NSR, LVH Assessment & Plan:

## 2011-02-14 ENCOUNTER — Ambulatory Visit (INDEPENDENT_AMBULATORY_CARE_PROVIDER_SITE_OTHER): Payer: BC Managed Care – PPO | Admitting: Family Medicine

## 2011-02-14 ENCOUNTER — Encounter: Payer: Self-pay | Admitting: Family Medicine

## 2011-02-14 VITALS — BP 111/78 | HR 81

## 2011-02-14 DIAGNOSIS — R0789 Other chest pain: Secondary | ICD-10-CM

## 2011-02-14 NOTE — Progress Notes (Signed)
  Subjective:    Patient ID: [name], female    DOB: 01-27-1971, 40 y.o.   MRN: [id number]  HPI  [name] presents from [facility] clinic due to chest pain.  She says that a few weeks ago she was ill and had a cough and fatigue.  She says she had some chest pain and chest tightness.  Pt says she did have tenderness to touch.   Patient does say that she had some fatigue, feeling tired, when walking long distances.  However, she says she does not have, and did not have chest pain or pressure or dyspnea with exertion.    Of note, patient does say that the fatigue has improved.    Patient does have family history of Grandmother with heart disease.   Review of Systems    Per HPI.  Objective:   Physical Exam  Nursing note and vitals reviewed. Constitutional: She is oriented to person, place, and time. She appears well-developed and well-nourished.  HENT:  Head: Normocephalic.  Eyes: EOM are normal.  Cardiovascular: Normal rate, regular rhythm and normal heart sounds.   Pulmonary/Chest: Effort normal and breath sounds normal. She exhibits tenderness.  Neurological: She is alert and oriented to person, place, and time.          Assessment & Plan:  History of chest pain that does not sound cardiac in origin. Her pretest probability is 15%. I do not think an exercise treadmill test is going to be helpful. I discussed this with her at length. I did recommend she restart her exercise. Should she have any worsening or new symptoms she will return to clinic either here or to see her family doctor. She is in agreement with this plan. I did review her medications which included aspirin 81 mg daily.

## 2011-02-14 NOTE — Assessment & Plan Note (Addendum)
Pt in low risk range due to age, gender, and well controlled chronic medical diseases. Pre test probability less than 20%.  Because of this, patient is not a good candidate for Treadmill stress test.  Discussed this with patient, and she agrees.    Advised patient to return to her regular exercise.   Reviewed warning signs of cardiac chest pain- exertional pain, lasts several minutes, relieved by rest, radiates to left arm.

## 2011-02-16 ENCOUNTER — Telehealth: Payer: Self-pay | Admitting: Family Medicine

## 2011-02-16 NOTE — Telephone Encounter (Signed)
She wanted to let Dr. Alvester Morin know that Urgent Care called her yesterday with the lab results and have told her to stop taking the Metformin and start with [place].  She started the 50mg  this morning.  They want to see her again in a month.  [facility] on [address] is aware that she has a PCP now.  She does have a copy of the lab results and she will bring by.

## 2011-02-16 NOTE — Telephone Encounter (Signed)
Will forward to Dr Newton. 

## 2011-02-16 NOTE — Telephone Encounter (Signed)
Rx change noted.

## 2011-03-16 ENCOUNTER — Ambulatory Visit (INDEPENDENT_AMBULATORY_CARE_PROVIDER_SITE_OTHER): Payer: BC Managed Care – PPO | Admitting: Family Medicine

## 2011-03-16 ENCOUNTER — Encounter: Payer: Self-pay | Admitting: Family Medicine

## 2011-03-16 DIAGNOSIS — E119 Type 2 diabetes mellitus without complications: Secondary | ICD-10-CM

## 2011-03-16 DIAGNOSIS — N182 Chronic kidney disease, stage 2 (mild): Secondary | ICD-10-CM | POA: Insufficient documentation

## 2011-03-16 DIAGNOSIS — I10 Essential (primary) hypertension: Secondary | ICD-10-CM

## 2011-03-16 LAB — BASIC METABOLIC PANEL
BUN: 17 mg/dL (ref 6–23)
Chloride: 102 mEq/L (ref 96–112)
Potassium: 3.8 mEq/L (ref 3.5–5.3)
Sodium: 137 mEq/L (ref 135–145)

## 2011-03-16 MED ORDER — GLIPIZIDE 10 MG PO TABS: 10.0000 mg | ORAL_TABLET | Freq: Two times a day (BID) | ORAL | Status: DC

## 2011-03-16 NOTE — Progress Notes (Signed)
  Subjective:    Patient ID: [name], female    DOB: June 01, 1970, 40 y.o.   MRN: [id number]  HPI Pt is here for chronic problem follow up:   DM: Checking CBGs?:yes How Often?:once to twice daily Blood Sugar Range:usually in 190s per pt  Polyuria, polydypsia, polyphagia:no Symptomatic Hypoglycemia:no Medication Compliance?:yes; pt is currently on januvia 50mg  daily. Pt was previously on metformin 500 daily but was discontinued 2/2 to Stage II CKD. Pt states that [place] has been very expensive in terms of cost.  ACE if hypertensive/obesity?:yes Statin if LDL >100?:LDL at 100 with most recent blood work.   HTN:  Checking BPS Daily ?:no BP Range:unknown Any HA, CP, SOB?:no Any Medication Side Effects:no Medication Compliance:yes Salt intake?:high, pt states that she eats a lot of high salt foods.  Exercise?:no  Weight Loss?:weigth relatively stable.   Review of Systems See HPI, otherwise 12 point ROS negative.    Objective:   Physical Exam Gen: up in chair, NAD, mildly obese HEENT: NCAT, EOMI, TMs clear bilaterally CV: RRR, no murmurs auscultated PULM: CTAB, no wheezes, rales, rhoncii ABD: S/NT/+ bowel sounds  EXT: 2+ peripheral pulses   Assessment & Plan:

## 2011-03-16 NOTE — Assessment & Plan Note (Signed)
Well controlled today. Will decrease ACE/HCTZ combo in setting of stage 2 CKD. Will recheck Cr and K. If GFR still in 60s, will likely refer to renal.

## 2011-03-16 NOTE — Patient Instructions (Signed)
He was good to see today For your blood pressure tobacco he lisinopril/HCTZ to half a tablet daily I am rechecking your kidney function today Please avoid any NSAID medications including ibuprofen, Motrin, Aleve as these can adversely affect her kidneys I'm starting you on glipizide for your diabetes Come back to see me in one month Call with any questions,  God Bless Suzanne Albee MD   1.5 Gram Low Sodium Diet A 1.5 gram sodium diet restricts the amount of sodium in the diet to no more than 1.5 g or 1500 mg daily. The American Heart Association recommends Americans over the age of 49 to consume no more than 1500 mg of sodium each day to reduce the risk of developing high blood pressure. Research also shows that limiting sodium may reduce heart attack and stroke risk. Many foods contain sodium for flavor and sometimes as a preservative. When the amount of sodium in a diet needs to be low, it is important to know what to look for when choosing foods and drinks. The following includes some information and guidelines to help make it easier for you to adapt to a low sodium diet. QUICK TIPS  Do not add salt to food.   Avoid convenience items and fast food.   Choose unsalted snack foods.   Buy lower sodium products, often labeled as "lower sodium" or "no salt added."   Check food labels to learn how much sodium is in 1 serving.   When eating at a restaurant, ask that your food be prepared with less salt or none, if possible.  READING FOOD LABELS FOR SODIUM INFORMATION The nutrition facts label is a good place to find how much sodium is in foods. Look for products with no more than 400 mg of sodium per serving. Remember that 1.5 g = 1500 mg. The food label may also list foods as:  Sodium-free: Less than 5 mg in a serving.   Very low sodium: 35 mg or less in a serving.   Low-sodium: 140 mg or less in a serving.   Light in sodium: 50% less sodium in a serving. For example, if a food that  usually has 300 mg of sodium is changed to become light in sodium, it will have 150 mg of sodium.   Reduced sodium: 25% less sodium in a serving. For example, if a food that usually has 400 mg of sodium is changed to reduced sodium, it will have 300 mg of sodium.  CHOOSING FOODS Grains  Avoid: Salted crackers and snack items. Some cereals, including instant hot cereals. Bread stuffing and biscuit mixes. Seasoned rice or pasta mixes.   Choose: Unsalted snack items. Low-sodium cereals, oats, puffed wheat and rice, shredded wheat. English muffins and bread. Pasta.  Meats  Avoid: Salted, canned, smoked, spiced, pickled meats, including fish and poultry. Bacon, ham, sausage, cold cuts, hot dogs, anchovies.   Choose: Low-sodium canned tuna and salmon. Fresh or frozen meat, poultry, and fish.  Dairy  Avoid: Processed cheese and spreads. Cottage cheese. Buttermilk and condensed milk. Regular cheese.   Choose: Milk. Low-sodium cottage cheese. Yogurt. Sour cream. Low-sodium cheese.  Fruits and Vegetables  Avoid: Regular canned vegetables. Regular canned tomato sauce and paste. Frozen vegetables in sauces. Olives. Rosita Fire. Relishes. Sauerkraut.   Choose: Low-sodium canned vegetables. Low-sodium tomato sauce and paste. Frozen or fresh vegetables. Fresh and frozen fruit.  Condiments  Avoid: Canned and packaged gravies. Worcestershire sauce. Tartar sauce. Barbecue sauce. Soy sauce. Steak sauce. Ketchup. Onion, garlic,  and table salt. Meat flavorings and tenderizers.   Choose: Fresh and dried herbs and spices. Low-sodium varieties of mustard and ketchup. Lemon juice. Tabasco sauce. Horseradish.  SAMPLE 1.5 GRAM SODIUM MEAL PLAN Breakfast / Sodium (mg)  1 cup low-fat milk / 143 mg   1 whole-wheat English muffin / 240 mg   1 tbs heart-healthy margarine / 153 mg   1 hard-boiled egg / 139 mg   1 small orange / 0 mg  Lunch / Sodium (mg)  1 cup raw carrots / 76 mg   2 tbs no salt added peanut  butter / 5 mg   2 slices whole-wheat bread / 270 mg   1 tbs jelly / 6 mg    cup red grapes / 2 mg  Dinner / Sodium (mg)  1 cup whole-wheat pasta / 2 mg   1 cup low-sodium tomato sauce / 73 mg   3 oz lean ground beef / 57 mg   1 small side salad (1 cup raw spinach leaves,  cup cucumber,  cup yellow bell pepper) with 1 tsp olive oil and 1 tsp red wine vinegar / 25 mg  Snack / Sodium (mg)  1 container low-fat vanilla yogurt / 107 mg   3 graham cracker squares / 127 mg  Nutrient Analysis  Calories: 1745   Protein: 75 g   Carbohydrate: 237 g   Fat: 57 g   Sodium: 1425 mg  Document Released: 04/11/2005 Document Revised: 12/22/2010 Document Reviewed: 07/13/2009 [facility] Patient Information 2012 [place], [place].

## 2011-03-16 NOTE — Assessment & Plan Note (Signed)
Rechecking Cr and K today. If GFR still in 60s, will likely refer to renal.

## 2011-03-16 NOTE — Assessment & Plan Note (Addendum)
A1C today 6.6.  At goal. Will transition pt to glipizide to be taken with meals. This is on the 4$ list. Will also check urine microalbumin.

## 2011-03-17 LAB — MICROALBUMIN / CREATININE URINE RATIO
Creatinine, Urine: 39.5 mg/dL
Microalb Creat Ratio: 12.7 mg/g (ref 0.0–30.0)

## 2011-03-25 ENCOUNTER — Encounter: Payer: Self-pay | Admitting: *Deleted

## 2011-03-25 ENCOUNTER — Telehealth: Payer: Self-pay | Admitting: Family Medicine

## 2011-03-25 NOTE — Telephone Encounter (Signed)
[name] is on Glipizide and is concerned that the dose might be too high.  Around this time of the day, she can tell that her sugar has dropped because she get the shakes.  She would like to speak to the nurse about these symptoms.

## 2011-03-25 NOTE — Telephone Encounter (Signed)
Message left to return call.

## 2011-03-25 NOTE — Telephone Encounter (Signed)
Consulted with Dr. Alvester Morin and he states for patient to cut  Glipizide in half. Advised to take 1/2 tab  twice daily. Has a follow up appointment 04/15/2011. Patient notified.

## 2011-03-25 NOTE — Telephone Encounter (Signed)
This encounter was created in error - please disregard.

## 2011-03-25 NOTE — Telephone Encounter (Signed)
Patient states she started Glipizide  10 mg 11/22. She generally takes  Med around 7:40 AM then eats breakfast.. Every AM since starting medication, around 10:30  she feels shaky . She will eat something or sip on regular pepsi and starts feeling better. Will notify Dr. Alvester Morin and call patient back.

## 2011-04-15 ENCOUNTER — Encounter: Payer: Self-pay | Admitting: Family Medicine

## 2011-04-15 ENCOUNTER — Ambulatory Visit (INDEPENDENT_AMBULATORY_CARE_PROVIDER_SITE_OTHER): Payer: BC Managed Care – PPO | Admitting: Family Medicine

## 2011-04-15 DIAGNOSIS — N182 Chronic kidney disease, stage 2 (mild): Secondary | ICD-10-CM

## 2011-04-15 DIAGNOSIS — E119 Type 2 diabetes mellitus without complications: Secondary | ICD-10-CM

## 2011-04-15 DIAGNOSIS — I10 Essential (primary) hypertension: Secondary | ICD-10-CM

## 2011-04-15 DIAGNOSIS — R5383 Other fatigue: Secondary | ICD-10-CM

## 2011-04-15 DIAGNOSIS — R5381 Other malaise: Secondary | ICD-10-CM

## 2011-04-15 DIAGNOSIS — E669 Obesity, unspecified: Secondary | ICD-10-CM

## 2011-04-15 NOTE — Patient Instructions (Signed)
It was good to see you today Your renal function is stable Be sure to be checking your blood sugar and blood pressure every day Try taking 1/4 tab of glipizide in the evening  Come back to see me in February Call with any questions,  God Estill Cotta Christmas, and Happy New Year  Doree Albee MD

## 2011-04-15 NOTE — Assessment & Plan Note (Addendum)
Mildly elevated today. Discussed importance of daily BP checks. Will continue with current regimen. Given daytime somnolence, general fatigue, obesity ,and HTN, will check for OSA. Most recent TSH 01/2011 WNL.

## 2011-04-15 NOTE — Assessment & Plan Note (Signed)
Most recent GFR >66. Will continue with current regimen.

## 2011-04-15 NOTE — Progress Notes (Signed)
S:  Pt is here for general medical follow up  HTN: Pt recently has lisinopril_HCTZ decreased in setting of stage 2 CKD. Pt states that she has been compliant with this change. Pt states that she is not checking her BPs on a daily basis. Pt denies any CP, SOB. Pt states that she may notice mild headache when BP is actually elevated. Pt states that she has a BP cuff at home. However, she stopped using it once BPs were under control. Pt also reports persistent daytime somnolenc and generalized fatigue. No SOB, DOE, or orthopnea. Pt does snore heavily.    DM:Pt states that she has been compliant with recent medication changes to glipizide. Pt reports that she has had episodes of symptomatic hypoglycemia whenever she takes a whole pill. This was discussed over the phone and pt's glipized dose was cut in half.  Today, pt states that she has noticed an improvement in hypoglycemia since this change was made. Pt states that she still gets intermittent weakness at times. Pt states that she notices a pattern og hypoglycemia if she takes her pm glipizide and then follows this with her am gliizide. Pt states that she eats a full meal with her glipizide. Symptoms seem to improve when she skips her pm glipizide dose. Pt states that CBGs have been mainly in 120s-140s.      O:  Current outpatient prescriptions:amLODipine (NORVASC) 5 MG tablet, take 1 tablet (5 MG TOTAL) by mouth once daily, Disp: 30 tablet, Rfl: 6;  aspirin 81 MG EC tablet, Take 81 mg by mouth daily.  , Disp: , Rfl: ;  docusate sodium (COLACE) 100 MG capsule, Take 100 mg by mouth 2 (two) times daily.  , Disp: , Rfl: ;  fluticasone (FLONASE) 50 MCG/ACT nasal spray, Place 2 sprays into the nose daily., Disp: 16 g, Rfl: 2 glipiZIDE (GLUCOTROL) 10 MG tablet, Take 1 tablet (10 mg total) by mouth 2 (two) times daily., Disp: 60 tablet, Rfl: 1;  Glucose Blood KIT, Test blood sugar daily- fasting Dx 250.00, Disp: 1 kit, Rfl: 0;  glucose blood test strip, Use as  instructed, Disp: 100 each, Rfl: 12;  Lancet Devices (AUTO-LANCETS) MISC, Test blood sugar as directed  Dx 250.00, Disp: 100 each, Rfl: 12 lisinopril-hydrochlorothiazide (PRINZIDE,ZESTORETIC) 20-25 MG per tablet, Take 1 tablet by mouth daily., Disp: 30 tablet, Rfl: 6;  ranitidine (ZANTAC) 150 MG tablet, Take 1 tablet (150 mg total) by mouth 2 (two) times daily., Disp: 60 tablet, Rfl: 3;  hydrOXYzine (ATARAX) 25 MG tablet, Take 25 mg by mouth every 6 (six) hours as needed. for itching , Disp: , Rfl:  ondansetron (ZOFRAN) 4 MG tablet, Take 1 tablet (4 mg total) by mouth daily as needed for nausea., Disp: 30 tablet, Rfl: 1  Wt Readings from Last 3 Encounters:  04/15/11 231 lb 14.4 oz (105.189 kg)  03/16/11 225 lb (102.059 kg)  02/10/11 224 lb (101.606 kg)   Temp Readings from Last 3 Encounters:  04/15/11 98.2 F (36.8 C) Oral  03/16/11 98.4 F (36.9 C) Oral  02/10/11 98.6 F (37 C) Oral   BP Readings from Last 3 Encounters:  04/15/11 138/91  03/16/11 117/81  02/14/11 111/78   Pulse Readings from Last 3 Encounters:  04/15/11 89  03/16/11 86  02/14/11 81    General: alert, cooperative and moderately obese HEENT: PERRLA and extra ocular movement intact Heart: S1, S2 normal, no murmur, rub or gallop, regular rate and rhythm Lungs: clear to auscultation, no wheezes or  rales and unlabored breathing Abdomen: abdomen is soft without significant tenderness, masses, organomegaly or guarding Extremities: extremities normal, atraumatic, no cyanosis or edema Skin:no rashes Neurology: normal without focal findings, mental status, speech normal, alert and oriented x3, PERLA and reflexes normal and symmetric   A/P:

## 2011-04-15 NOTE — Assessment & Plan Note (Signed)
Overall well controlled. Will do glipizide 1/2 tab in am +/- glipizide 1/4 tab in pm as to avoid symptomatic hypoglycemia. Will continue with metformin. Will follow up in 1-2 months.

## 2011-04-25 ENCOUNTER — Encounter: Payer: Self-pay | Admitting: Physician Assistant

## 2011-04-27 ENCOUNTER — Ambulatory Visit (INDEPENDENT_AMBULATORY_CARE_PROVIDER_SITE_OTHER): Payer: BC Managed Care – PPO | Admitting: *Deleted

## 2011-04-27 ENCOUNTER — Encounter: Payer: Self-pay | Admitting: Physician Assistant

## 2011-04-27 ENCOUNTER — Ambulatory Visit (INDEPENDENT_AMBULATORY_CARE_PROVIDER_SITE_OTHER): Payer: BC Managed Care – PPO | Admitting: Physician Assistant

## 2011-04-27 VITALS — BP 145/89 | HR 91 | Resp 16 | Ht 62.0 in | Wt 232.0 lb

## 2011-04-27 DIAGNOSIS — I739 Peripheral vascular disease, unspecified: Secondary | ICD-10-CM

## 2011-04-27 DIAGNOSIS — I70219 Atherosclerosis of native arteries of extremities with intermittent claudication, unspecified extremity: Secondary | ICD-10-CM | POA: Insufficient documentation

## 2011-04-27 DIAGNOSIS — M79609 Pain in unspecified limb: Secondary | ICD-10-CM | POA: Insufficient documentation

## 2011-04-27 DIAGNOSIS — Z48812 Encounter for surgical aftercare following surgery on the circulatory system: Secondary | ICD-10-CM

## 2011-04-27 DIAGNOSIS — I80219 Phlebitis and thrombophlebitis of unspecified iliac vein: Secondary | ICD-10-CM

## 2011-04-27 NOTE — Progress Notes (Signed)
Addended by: Darl Householder on: 04/27/2011 04:36 PM   Modules accepted: Orders

## 2011-04-27 NOTE — Progress Notes (Signed)
[facility] [place] HISTORY AND PHYSICAL   CC:  F/u for left iliac stent Doree Albee, MD  HPI: This is a 41 y.o. female here for duplex of the deep venous system of the left iliac and lower extremity.  Pt states that ~ 2 weeks ago, she had an episode of throbbing in her left leg.  She proceeded to put on her compression stockings and elevate her legs.  She states that the throbbing improved and she has not experienced it before then or since then.  She also states that she does have some pain in her calves when she walks, but states that no one thing makes it better.  She states that rest does not necessarily help it and she tries massaging also.  She also has c/o pain in her heels. Otherwise, she has no complaints.    Past Medical History  Diagnosis Date  . Hypertension   . Allergy     seasonal  . Obesity   . Hemorrhoids    Past Surgical History  Procedure Date  . Abdominal hysterectomy 2005    for Fibroids    No Known Allergies  Current Outpatient Prescriptions  Medication Sig Dispense Refill  . amLODipine (NORVASC) 5 MG tablet take 1 tablet (5 MG TOTAL) by mouth once daily  30 tablet  6  . aspirin 81 MG EC tablet Take 81 mg by mouth daily.        Marland Kitchen docusate sodium (COLACE) 100 MG capsule Take 100 mg by mouth 2 (two) times daily.        . fluticasone (FLONASE) 50 MCG/ACT nasal spray Place 2 sprays into the nose daily.  16 g  2  . glipiZIDE (GLUCOTROL) 10 MG tablet Take 1 tablet (10 mg total) by mouth 2 (two) times daily.  60 tablet  1  . Glucose Blood KIT Test blood sugar daily- fasting Dx 250.00  1 kit  0  . glucose blood test strip Use as instructed  100 each  12  . hydrOXYzine (ATARAX) 25 MG tablet Take 25 mg by mouth every 6 (six) hours as needed. for itching       . Lancet Devices (AUTO-LANCETS) MISC Test blood sugar as directed  Dx 250.00  100 each  12  . lisinopril-hydrochlorothiazide (PRINZIDE,ZESTORETIC) 20-25 MG per tablet Take 1 tablet by  mouth daily.  30 tablet  6  . ondansetron (ZOFRAN) 4 MG tablet Take 1 tablet (4 mg total) by mouth daily as needed for nausea.  30 tablet  1  . ranitidine (ZANTAC) 150 MG tablet Take 1 tablet (150 mg total) by mouth 2 (two) times daily.  60 tablet  3    Family History  Problem Relation Age of Onset  . Diabetes Mother   . Hypertension Mother   . Heart disease Sister     Sheppard Penton parkinson White    History   Social History  . Marital Status: Single    Spouse Name: N/A    Number of Children: N/A  . Years of Education: N/A   Occupational History  . Not on file.   Social History Main Topics  . Smoking status: Never Smoker   . Smokeless tobacco: Never Used  . Alcohol Use: Not on file  . Drug Use: Not on file  . Sexually Active: Not on file   Other Topics Concern  . Not on file   Social History Narrative  . No narrative on file  ROS: [x]  Positive   [ ]  Negative   [ ]  All sytems reviewed and are negative  Cardiovascular: [] chest pain; [] chest pressure; [] palpitations; [] SOB lying flat; [] DOE; [x] pain in legs with walking; [x] pain in legs when lying flat; [] Hx of DVT; [] Hx phlebitis; [] swelling in legs; [] varicose veins Pulmonary: [] productive cough; [] asthma; [] wheezing Neurologic:  [] Hx CVA;  [] weakness in arms or legs; [] numbness in arms or legs; [] difficulty in speaking or slurred speech; [] temporary loss of vision in one eye; [] dizziness Hematologic:  [] bleeding problems; [] clots easily GI:  [] vomiting blood; []  blood in stool; [] PUD GU: []  Dysuria; [] hematuria Psychiatric:  [] Hx major depression Integumentary:  [] rashes; [] ulcers Constitutional:  [] fever; [] chills    PHYSICAL EXAMINATION:  Filed Vitals:   04/27/11 1550  BP: 145/89  Pulse: 91  Resp: 16   Body mass index is 42.43 kg/(m^2).  General:  WDWN in NAD Gait: Normal HENT: WNL Eyes: PERRL Pulmonary: normal non-labored breathing , without Rales, rhonchi,  wheezing Cardiac: RRR, without  Murmurs,  rubs or gallops; No carotid bruits Abdomen: soft, NT, no masses Skin: no rashes, ulcers noted Vascular Exam/Pulses: 2+ palpable radial pulses; she has good doppler pulses bilaterally DP/PT.  Her feet bilaterally are warm. Extremities without ischemic changes, no Gangrene , no cellulitis; no open wounds;  Musculoskeletal: no muscle wasting or atrophy  Neurologic: A&O X 3; Appropriate Affect ; SENSATION: normal; MOTOR FUNCTION:  moving all extremities equally. Speech is fluent/normal  Non-Invasive Vascular Imaging:  No evidence of acute deep or superficial vein thrombosis of the LLE.  Patent left external iliac vein stent.  ASSESSMENT: 41 y.o. female who is s/p stent placement of the left external iliac vein. Her scan today is without evidence of of thrombosis and her stent is patent.  PLAN: plan for her to come back in a year for an aortoiliac u/s and she will be NPO.  She knows to call us before then if she has any problems. -I instructed her to wear gel pads in her shoes and not walk barefooted for what sounds like plantar fascitis.  If she has further problems, she will see an orthopedic MD.  Newton Pigg, PA-C [facility] [phone]  Clinic MD Edilia Bo

## 2011-05-02 NOTE — Procedures (Unsigned)
DUPLEX DEEP VENOUS EXAM - LOWER EXTREMITY  INDICATION:  HISTORY:  Edema:  No Trauma/Surgery:  Left iliac vein stent placed 02/06/2007 Pain:  Left calf pain PE: Previous DVT:  No Anticoagulants:  Aspirin Other:  DUPLEX EXAM:               CFV   SFV   PopV  PTV    GSV               R  L  R  L  R  L  R   L  R  L Thrombosis       0     0     0      0     0 Spontaneous      +     +     +      +     + Phasic           +     +     +      +     + Augmentation     +     +     +      +     + Compressible     +     +     +      +     + Competent  Legend:  + - yes  o - no  p - partial  D - decreased  IMPRESSION: 1. No evidence of acute deep or superficial vein thrombosis of the     left lower extremity. 2. Patent left external iliac vein stent.   _____________________________ V. Charlena Cross, MD  SS/MEDQ  D:  04/27/2011  T:  04/27/2011  Job:  [id number]

## 2011-05-27 ENCOUNTER — Ambulatory Visit (HOSPITAL_BASED_OUTPATIENT_CLINIC_OR_DEPARTMENT_OTHER): Payer: BC Managed Care – PPO | Attending: Internal Medicine

## 2011-05-27 VITALS — Ht 62.0 in | Wt 230.0 lb

## 2011-05-27 DIAGNOSIS — G4733 Obstructive sleep apnea (adult) (pediatric): Secondary | ICD-10-CM

## 2011-05-27 DIAGNOSIS — I10 Essential (primary) hypertension: Secondary | ICD-10-CM

## 2011-05-27 DIAGNOSIS — R5383 Other fatigue: Secondary | ICD-10-CM

## 2011-05-27 DIAGNOSIS — E669 Obesity, unspecified: Secondary | ICD-10-CM

## 2011-06-05 DIAGNOSIS — G4733 Obstructive sleep apnea (adult) (pediatric): Secondary | ICD-10-CM

## 2011-06-05 DIAGNOSIS — R0609 Other forms of dyspnea: Secondary | ICD-10-CM

## 2011-06-05 DIAGNOSIS — R0989 Other specified symptoms and signs involving the circulatory and respiratory systems: Secondary | ICD-10-CM

## 2011-06-06 NOTE — Procedures (Signed)
[name], [name]                ACCOUNT NO.:  [id number]  MEDICAL RECORD NO.:  [id number]          PATIENT TYPE:  OUT  LOCATION:  SLEEP CENTER                 FACILITY:  [facility]  PHYSICIAN:  Clinton D. Maple Hudson, MD, FCCP, FACPDATE OF BIRTH:  06/08/1969  DATE OF STUDY:  05/27/2011                           NOCTURNAL POLYSOMNOGRAM  REFERRING PHYSICIAN:  Doree Albee, MD  REFERRING PHYSICIAN:  Doree Albee, MD.  INDICATION FOR STUDY:  Hypersomnia with sleep apnea.  EPWORTH SLEEPINESS SCORE:  4/24.  BMI 42.1, weight 230 pounds, height 62 inches, neck 16 inches.  MEDICATIONS:  Home medications are charted and reviewed.  SLEEP ARCHITECTURE:  Total sleep time 371 minutes with sleep efficiency 82.1%.  Stage I was 3.5%, stage II 93.8, stage III 1.1%.  REM, 1.6% of total sleep time.  Sleep latency 1.5 minutes.  REM latency 374.5 minutes.  Awake after sleep onset 78 minutes.  Arousal index 10.5.  BEDTIME MEDICATION:  None.  RESPIRATORY DATA:  Apnea-hypopnea index (AHI) 10.5 per hour.  A total of 65 events was scored including 16 obstructive apneas, 1 central apnea, 48 hypopneas.  Events were seen in all sleep positions with more while supine.  REM AHI 40 per hour.  There were insufficient numbers of early respiratory events to permit application of split protocol, CPAP titration on this study night.  OXYGEN DATA:  Mild snoring with oxygen desaturation to a nadir of 80% and a mean oxygen saturation through the study of 94.5% on room air.  CARDIAC DATA:  Sinus rhythm with occasional PVC and PAC.  MOVEMENT-PARASOMNIA:  No significant movement disturbance.  Bathroom x1.  IMPRESSION-RECOMMENDATIONS: 1. Sleep architecture was unremarkable for sleep center environment     with note of reduced time spent in REM.  She was spontaneously     awake between 2 and 3:15 a.m.  No bedtime medication was taken. 2. Mild obstructive sleep apnea/hypopnea syndrome, AHI 10.5 per hour     with events in  all sleep positions, and especially while in REM.     REM AHI 10.5 per hour.  Mild snoring with oxygen desaturation to a     nadir of 80% and a mean oxygen saturation through the study of     94.5% on room air. 3. There were insufficient numbers of early events to permit     application of split protocol, CPAP titration on the study night.     Conservative therapy may include encouragement to sleep off of     back, and weight loss.  If     clinically indicated, consider return for dedicated CPAP titration     study or evaluate for alternative management as appropriate.     Clinton D. Maple Hudson, MD, [facility], FACP Diplomate, Biomedical engineer of Sleep Medicine Electronically Signed    CDY/MEDQ  D:  06/05/2011 09:29:54  T:  06/06/2011 05:46:39  Job:  [id number]

## 2011-06-09 ENCOUNTER — Other Ambulatory Visit: Payer: Self-pay | Admitting: Family Medicine

## 2011-06-09 NOTE — Telephone Encounter (Signed)
Refill request

## 2011-07-12 ENCOUNTER — Other Ambulatory Visit: Payer: Self-pay | Admitting: Family Medicine

## 2011-08-19 ENCOUNTER — Ambulatory Visit (INDEPENDENT_AMBULATORY_CARE_PROVIDER_SITE_OTHER): Payer: BC Managed Care – PPO | Admitting: Family Medicine

## 2011-08-19 ENCOUNTER — Encounter: Payer: Self-pay | Admitting: Family Medicine

## 2011-08-19 DIAGNOSIS — E119 Type 2 diabetes mellitus without complications: Secondary | ICD-10-CM

## 2011-08-19 DIAGNOSIS — I10 Essential (primary) hypertension: Secondary | ICD-10-CM

## 2011-08-19 DIAGNOSIS — Z23 Encounter for immunization: Secondary | ICD-10-CM

## 2011-08-19 DIAGNOSIS — N182 Chronic kidney disease, stage 2 (mild): Secondary | ICD-10-CM

## 2011-08-19 DIAGNOSIS — M722 Plantar fascial fibromatosis: Secondary | ICD-10-CM

## 2011-08-19 NOTE — Progress Notes (Signed)
S:   HTN:  Checking BPS Daily ?: yes  BP Range: 140s-160s Any HA, CP, SOB?:no Any Medication Side Effects:no Medication Compliance:yes Salt intake?:low per pt Exercise?:no Weight Loss?:no  Lab Results  Component Value Date   LDLCALC 100* 02/10/2011     DM: Checking CBGs?:no How Often?:n/a Blood Sugar Range:n/a Polyuria, polydypsia, polyphagia:no Symptomatic Hypoglycemia: intermittent depending on whether taking glipizide with a meal.  Medication Compliance?:yes ACE if hypertensive/obesity?:yes Statin if LDL >100?:LDL @ 100   Lab Results  Component Value Date   HGBA1C 6.6 03/16/2011   L foot Pain: Patient reports a for pain for the past one to 2 weeks. Pain is worse in the morning upon standing especially while barefoot. Patient denies any trauma to the affected area. Patient denies any history of this in the past. Patient has not tried anything for relief. Patient denies any focal numbness or paresthesias. No tingling.    Review of Systems - Negative except as noted above in HPI   O:  Current Outpatient Prescriptions  Medication Sig Dispense Refill  . amLODipine (NORVASC) 5 MG tablet take 1 tablet by mouth once daily  30 tablet  11  . aspirin 81 MG EC tablet Take 81 mg by mouth daily.        Marland Kitchen docusate sodium (COLACE) 100 MG capsule Take 100 mg by mouth 2 (two) times daily.        . fluticasone (FLONASE) 50 MCG/ACT nasal spray Place 2 sprays into the nose daily.  16 g  2  . glipiZIDE (GLUCOTROL) 10 MG tablet take 1 tablet by mouth twice a day  60 tablet  1  . Glucose Blood KIT Test blood sugar daily- fasting Dx 250.00  1 kit  0  . hydrOXYzine (ATARAX) 25 MG tablet Take 25 mg by mouth every 6 (six) hours as needed. for itching       . Lancet Devices (AUTO-LANCETS) MISC Test blood sugar as directed  Dx 250.00  100 each  12  . lisinopril-hydrochlorothiazide (PRINZIDE,ZESTORETIC) 20-25 MG per tablet take 1 tablet by mouth once daily  30 tablet  6  . ondansetron  (ZOFRAN) 4 MG tablet Take 1 tablet (4 mg total) by mouth daily as needed for nausea.  30 tablet  1  . ONE TOUCH ULTRA TEST test strip TEST as directed by prescriber  100 each  12  . ranitidine (ZANTAC) 150 MG tablet Take 1 tablet (150 mg total) by mouth 2 (two) times daily.  60 tablet  3    Wt Readings from Last 3 Encounters:  08/19/11 238 lb 9.6 oz (108.228 kg)  05/27/11 230 lb (104.327 kg)  04/27/11 232 lb (105.235 kg)   Temp Readings from Last 3 Encounters:  04/15/11 98.2 F (36.8 C) Oral  03/16/11 98.4 F (36.9 C) Oral  02/10/11 98.6 F (37 C) Oral   BP Readings from Last 3 Encounters:  08/19/11 130/88  04/27/11 145/89  04/15/11 138/91   Pulse Readings from Last 3 Encounters:  08/19/11 90  04/27/11 91  04/15/11 89     General: alert and cooperative HEENT: PERRLA and extra ocular movement intact Heart: S1, S2 normal, no murmur, rub or gallop, regular rate and rhythm Lungs: clear to auscultation, no wheezes or rales and unlabored breathing Abdomen: abdomen is soft without significant tenderness, masses, organomegaly or guarding Extremities: extremities normal, atraumatic, no cyanosis or edema L Heel: + TTP on palpation on plantar aspect of L heel, + pain with heel walking Skin:no rashes  Neurology: normal without focal findings   A/P:

## 2011-08-19 NOTE — Patient Instructions (Signed)

## 2011-08-19 NOTE — Assessment & Plan Note (Signed)
BPs stable. Will check Cr and K.   

## 2011-08-19 NOTE — Assessment & Plan Note (Addendum)
Rechecking GFR today. If deteriorates to <60, will likely refer to renal.

## 2011-08-19 NOTE — Assessment & Plan Note (Signed)
Checking A1C today. Goal of less than 7. May consider changing from glipizide to Januvia to decrease risk of hypoglycemia. Metformin contraindicated 2/2 renal insufficiency.

## 2011-08-20 LAB — COMPREHENSIVE METABOLIC PANEL
CO2: 24 mEq/L (ref 19–32)
Creat: 1.11 mg/dL — ABNORMAL HIGH (ref 0.50–1.10)
Glucose, Bld: 102 mg/dL — ABNORMAL HIGH (ref 70–99)
Sodium: 137 mEq/L (ref 135–145)
Total Bilirubin: 0.3 mg/dL (ref 0.3–1.2)
Total Protein: 7.8 g/dL (ref 6.0–8.3)

## 2011-08-20 LAB — LDL CHOLESTEROL, DIRECT: Direct LDL: 111 mg/dL — ABNORMAL HIGH

## 2011-08-22 DIAGNOSIS — M722 Plantar fascial fibromatosis: Secondary | ICD-10-CM | POA: Insufficient documentation

## 2011-08-22 NOTE — Assessment & Plan Note (Signed)
Heel cushion and stretching exercises discussed. Handout given. Tylenol for pain as to avoid NSAIDs in setting of CKD.

## 2011-09-05 ENCOUNTER — Ambulatory Visit (INDEPENDENT_AMBULATORY_CARE_PROVIDER_SITE_OTHER): Payer: BC Managed Care – PPO | Admitting: Family Medicine

## 2011-09-05 ENCOUNTER — Encounter: Payer: Self-pay | Admitting: Family Medicine

## 2011-09-05 VITALS — BP 122/82 | HR 103 | Temp 98.4°F | Ht 62.0 in | Wt 239.0 lb

## 2011-09-05 VITALS — BP 135/93 | HR 96 | Temp 98.0°F | Ht 62.0 in | Wt 239.9 lb

## 2011-09-05 DIAGNOSIS — M2140 Flat foot [pes planus] (acquired), unspecified foot: Secondary | ICD-10-CM

## 2011-09-05 DIAGNOSIS — M722 Plantar fascial fibromatosis: Secondary | ICD-10-CM

## 2011-09-05 NOTE — Assessment & Plan Note (Signed)
Discussed supportive care and reassured her that some relief of pain after several weeks is actually good improvement.  Discussed normal course of resolution over months.  She would like referral to sports medicine for more aggressive care and is interested in injection.  Will place referral today.

## 2011-09-05 NOTE — Patient Instructions (Signed)
See handout on plantar fasciitis. Will have sports medicine call you for an appointment if you don't hear within a week, please let us know

## 2011-09-05 NOTE — Progress Notes (Signed)
Subjective:    Patient ID: [name], female    DOB: Aug 29, 1970, 41 y.o.   MRN: [id number]  PCP: Doree Albee  HPI 41 yo F here for left heel pain.  Patient denies known injury. States over past month has had slowly worsening left plantar heel pain. Started walking for exercise back in march - thinks this may be related. Pain radiates up lower leg at times. Has heel lift and doing some home exercises. Icing occasionally.  Past Medical History  Diagnosis Date  . Hypertension   . Allergy     seasonal  . Obesity   . Hemorrhoids   . Diabetes mellitus     Current Outpatient Prescriptions on File Prior to Visit  Medication Sig Dispense Refill  . amLODipine (NORVASC) 5 MG tablet take 1 tablet by mouth once daily  30 tablet  11  . aspirin 81 MG EC tablet Take 81 mg by mouth daily.        . fluticasone (FLONASE) 50 MCG/ACT nasal spray Place 2 sprays into the nose daily.  16 g  2  . glipiZIDE (GLUCOTROL) 10 MG tablet take 1 tablet by mouth twice a day  60 tablet  1  . Glucose Blood KIT Test blood sugar daily- fasting Dx 250.00  1 kit  0  . Lancet Devices (AUTO-LANCETS) MISC Test blood sugar as directed  Dx 250.00  100 each  12  . lisinopril-hydrochlorothiazide (PRINZIDE,ZESTORETIC) 20-25 MG per tablet take 1 tablet by mouth once daily  30 tablet  6  . ONE TOUCH ULTRA TEST test strip TEST as directed by prescriber  100 each  12    Past Surgical History  Procedure Date  . Abdominal hysterectomy 2005    for Fibroids  . Iliac vein angioplasty / stenting     No Known Allergies  History   Social History  . Marital Status: Single    Spouse Name: N/A    Number of Children: N/A  . Years of Education: N/A   Occupational History  . Not on file.   Social History Main Topics  . Smoking status: Never Smoker   . Smokeless tobacco: Never Used  . Alcohol Use: Not on file  . Drug Use: Not on file  . Sexually Active: Not on file   Other Topics Concern  . Not on file    Social History Narrative  . No narrative on file    Family History  Problem Relation Age of Onset  . Diabetes Mother   . Hypertension Mother   . Hyperlipidemia Mother   . Heart disease Sister     [employer]  . Heart attack Neg Hx   . Sudden death Neg Hx   . Hypertension Other     BP 122/82  Pulse 103  Temp 98.4 F (36.9 C)  Ht 5\' 2"  (1.575 m)  Wt 239 lb (108.41 kg)  BMI 43.71 kg/m2  Review of Systems See HPI above.    Objective:   Physical Exam Gen: NAD  L foot/ankle: Pes planus No gross deformity, swelling, ecchymoses FROM with pain on full dorsiflexion. TTP anterior plantar calcaneus at PF insertion. Negative ant drawer and talar tilt.   Negative syndesmotic compression. Thompsons test negative. NV intact distally.    Assessment & Plan:  1. Left plantar fasciitis - arch straps, reviewed home rehab protocol and handout provided.  Sports insoles with arch support and heel lifts as well.  She would like to go ahead with  cortisone injection which was done.  If not improving consider custom orthotics, formal PT.  After informed written consent patient was seated on exam table medial aspect of foot near proximal plantar fascia prepped with alcohol swab and plantar fascia injected with 1:1 marcaine: depomedrol with 3 needle passes.  Patient tolerated procedure well without immediate complications.

## 2011-09-05 NOTE — Assessment & Plan Note (Signed)
arch straps, reviewed home rehab protocol and handout provided.  Sports insoles with arch support and heel lifts as well.  She would like to go ahead with cortisone injection which was done.  If not improving consider custom orthotics, formal PT.  After informed written consent patient was seated on exam table medial aspect of foot near proximal plantar fascia prepped with alcohol swab and plantar fascia injected with 1:1 marcaine: depomedrol with 3 needle passes.  Patient tolerated procedure well without immediate complications.

## 2011-09-05 NOTE — Progress Notes (Signed)
  Subjective:    Patient ID: [name], female    DOB: [birth year], 41 y.o.   MRN: [id number]  HPI Here for follow-up left heel pain x 1 month.  Saw here PCP several weeks ago with right heel pain.  Was dx with plantar fasciitis.  Was given heel pads and instructed to ice.  She notes some improvement in pain, but continues with pain and stiffness in heel.    No injury, trauma, swelling  Walks on treadmill occassionally.  Review of Systems see HPI     Objective:   Physical Exam  GEN: NAD Foot:  Mild TTP over medial heel and along plantar fascia.  No achilles tenderness.      Assessment & Plan:

## 2011-09-05 NOTE — Patient Instructions (Signed)
You have plantar fasciitis Take tylenol or aleve as needed for pain  Plantar fascia stretch for 20-30 seconds (do 3 of these) in morning Lowering/raise on a step exercises 3 x 10 once or twice a day - this is very important for long term recovery. Can add heel walks, toe walks forward and backward as well Ice heel for 15 minutes as needed. Avoid flat shoes/barefoot walking as much as possible. Arch straps have been shown to help with pain. Orthotics with arch support and heel lift may be helpful Steroid injection is a consideration for short term pain relief if you are struggling. Physical therapy is also an option. Follow up with me in 6 weeks for reevaluation.

## 2011-09-06 ENCOUNTER — Other Ambulatory Visit: Payer: Self-pay | Admitting: Family Medicine

## 2011-10-03 ENCOUNTER — Encounter: Payer: Self-pay | Admitting: Obstetrics and Gynecology

## 2011-10-03 ENCOUNTER — Ambulatory Visit (INDEPENDENT_AMBULATORY_CARE_PROVIDER_SITE_OTHER): Payer: BC Managed Care – PPO | Admitting: Obstetrics and Gynecology

## 2011-10-03 VITALS — BP 112/80 | Resp 18 | Ht 62.0 in | Wt 232.0 lb

## 2011-10-03 DIAGNOSIS — Z01419 Encounter for gynecological examination (general) (routine) without abnormal findings: Secondary | ICD-10-CM

## 2011-10-03 DIAGNOSIS — N898 Other specified noninflammatory disorders of vagina: Secondary | ICD-10-CM

## 2011-10-03 DIAGNOSIS — Z90710 Acquired absence of both cervix and uterus: Secondary | ICD-10-CM

## 2011-10-03 DIAGNOSIS — L293 Anogenital pruritus, unspecified: Secondary | ICD-10-CM

## 2011-10-03 DIAGNOSIS — Z8742 Personal history of other diseases of the female genital tract: Secondary | ICD-10-CM

## 2011-10-03 LAB — POCT WET PREP (WET MOUNT): pH: 5.5

## 2011-10-03 MED ORDER — CLOTRIMAZOLE-BETAMETHASONE 1-0.05 % EX CREA: TOPICAL_CREAM | Freq: Every day | CUTANEOUS | Status: DC

## 2011-10-03 MED ORDER — TINIDAZOLE 500 MG PO TABS: 2.0000 g | ORAL_TABLET | Freq: Every day | ORAL | Status: AC

## 2011-10-03 NOTE — Progress Notes (Signed)
Patient ID: [name], female   DOB: June 06, 1970, 41 y.o.   MRN: [id number] Contraception hyst Last pap 2004/ hx of abnl pap Last Mammo 08/2011 Last Colonoscopy never Last Dexa Scan never Primary MD Dr. Doree Albee Abuse at Home none  C/o itching in creases of groin and incision coming apart some  Filed Vitals:   10/03/11 1516  BP: 112/80  Resp: 18   ROS: noncontributory  Physical Examination: General appearance - alert, well appearing, and in no distress Neck - supple, no significant adenopathy Chest - clear to auscultation, no wheezes, rales or rhonchi, symmetric air entry Heart - normal rate and regular rhythm Abdomen - soft, nontender, nondistended, no masses or organomegaly Breasts - breasts appear normal, no suspicious masses, no skin or nipple changes or axillary nodes Pelvic - normal external genitalia, vulva, vagina and adnexa, thick white d/c, s/p hysterectomy (still has ovaries), no obvious vulvar lesions Back exam - no CVAT Extremities - no edema, redness or tenderness in the calves or thighs  Results for orders placed in visit on 10/03/11  POCT WET PREP (WET MOUNT)      Component Value Range   Source Wet Prep POC       WBC, Wet Prep HPF POC       Bacteria Wet Prep HPF POC mild/few     BACTERIA WET PREP MORPHOLOGY POC       Clue Cells Wet Prep HPF POC Moderate     CLUE CELLS WET PREP WHIFF POC Positive Whiff     Yeast Wet Prep HPF POC None     KOH Wet Prep POC       Trichomonas Wet Prep HPF POC none     pH 5.5     RBC few      A/P Vulvitis - trial of lotrisone BV-tindamax Pap today - if neg, may d/c paps

## 2011-10-04 LAB — PAP IG W/ RFLX HPV ASCU

## 2011-10-17 ENCOUNTER — Encounter: Payer: BC Managed Care – PPO | Admitting: Obstetrics and Gynecology

## 2011-10-17 ENCOUNTER — Ambulatory Visit (INDEPENDENT_AMBULATORY_CARE_PROVIDER_SITE_OTHER): Payer: BC Managed Care – PPO | Admitting: Family Medicine

## 2011-10-17 ENCOUNTER — Encounter: Payer: Self-pay | Admitting: Family Medicine

## 2011-10-17 ENCOUNTER — Other Ambulatory Visit: Payer: BC Managed Care – PPO

## 2011-10-17 VITALS — BP 134/84 | HR 99 | Temp 98.3°F | Ht 62.0 in | Wt 235.0 lb

## 2011-10-17 DIAGNOSIS — M722 Plantar fascial fibromatosis: Secondary | ICD-10-CM

## 2011-10-17 NOTE — Patient Instructions (Addendum)
You have plantar fasciitis Take tylenol or aleve as needed for pain Plantar fascia stretch for 20-30 seconds (do 3 of these) in morning Lowering/raise on a step exercises 3 x 10 once or twice a day - this is very important for long term recovery. Can add heel walks, toe walks forward and backward as well Ice heel for 15 minutes as needed. Avoid flat shoes/barefoot walking as much as possible. Arch straps have been shown to help with pain. Orthotics with arch support and heel lift may be helpful Options moving forward: formal physical therapy, custom orthotics, repeat injection. Follow up with me in 6 weeks or as needed.

## 2011-10-19 ENCOUNTER — Encounter: Payer: Self-pay | Admitting: Family Medicine

## 2011-10-19 NOTE — Assessment & Plan Note (Signed)
Left plantar fasciitis - Overall improved.  She would like to continue with current treatment at this time - arch straps, home exercises/stretches.  Advised things to consider include orthotics, formal physical therapy, repeat injection - she would like to wait on these at this point.

## 2011-10-19 NOTE — Progress Notes (Signed)
Subjective:    Patient ID: [name], female    DOB: 1970/05/25, 41 y.o.   MRN: [id number]  PCP: Doree Albee  HPI  41 yo F here for f/u left heel pain.  5/13: Patient denies known injury. States over past month has had slowly worsening left plantar heel pain. Started walking for exercise back in march - thinks this may be related. Pain radiates up lower leg at times. Has heel lift and doing some home exercises. Icing occasionally.  6/24: Patient reports she is about 20-30% improved from last visit. Felt significantly better until Friday when she was standing for a prolonged period and pain worsened by end of that day. Is using arch binders, doing home exercises/stretches.  Improved with cortisone shot. Not icing or taking any medications now.  Past Medical History  Diagnosis Date  . Hypertension   . Allergy     seasonal  . Obesity   . Hemorrhoids   . Diabetes mellitus   . Abnormal Pap smear     Current Outpatient Prescriptions on File Prior to Visit  Medication Sig Dispense Refill  . amLODipine (NORVASC) 5 MG tablet take 1 tablet by mouth once daily  30 tablet  11  . aspirin 81 MG EC tablet Take 81 mg by mouth daily.        . clotrimazole-betamethasone (LOTRISONE) cream Apply topically daily. For 5 days.  30 g  0  . fluticasone (FLONASE) 50 MCG/ACT nasal spray Place 2 sprays into the nose daily.  16 g  2  . glipiZIDE (GLUCOTROL) 10 MG tablet take 1 tablet by mouth twice a day  60 tablet  1  . Glucose Blood KIT Test blood sugar daily- fasting Dx 250.00  1 kit  0  . Lancet Devices (AUTO-LANCETS) MISC Test blood sugar as directed  Dx 250.00  100 each  12  . lisinopril-hydrochlorothiazide (PRINZIDE,ZESTORETIC) 20-25 MG per tablet take 1 tablet by mouth once daily  30 tablet  6  . ONE TOUCH ULTRA TEST test strip TEST as directed by prescriber  100 each  12  . ranitidine (ZANTAC) 150 MG tablet take 1 tablet by mouth twice a day  60 tablet  3    Past Surgical  History  Procedure Date  . Abdominal hysterectomy 2005    for Fibroids  . Iliac vein angioplasty / stenting     No Known Allergies  History   Social History  . Marital Status: Single    Spouse Name: N/A    Number of Children: N/A  . Years of Education: N/A   Occupational History  . Not on file.   Social History Main Topics  . Smoking status: Never Smoker   . Smokeless tobacco: Never Used  . Alcohol Use: Not on file  . Drug Use: Not on file  . Sexually Active: Not on file   Other Topics Concern  . Not on file   Social History Narrative  . No narrative on file    Family History  Problem Relation Age of Onset  . Diabetes Mother   . Hypertension Mother   . Hyperlipidemia Mother   . Heart disease Sister     [employer]  . Heart attack Neg Hx   . Sudden death Neg Hx   . Hypertension Other     BP 134/84  Pulse 99  Temp 98.3 F (36.8 C) (Oral)  Ht 5\' 2"  (1.575 m)  Wt 235 lb (106.595 kg)  BMI 42.98  kg/m2  Review of Systems  See HPI above.    Objective:   Physical Exam  Gen: NAD  L foot/ankle: Pes planus No gross deformity, swelling, ecchymoses FROM with minimal pain on full dorsiflexion. TTP anterior plantar calcaneus at PF insertion. Negative ant drawer and talar tilt.   Negative syndesmotic compression. Thompsons test negative. NV intact distally.    Assessment & Plan:  1. Left plantar fasciitis - Overall improved.  She would like to continue with current treatment at this time - arch straps, home exercises/stretches.  Advised things to consider include orthotics, formal physical therapy, repeat injection - she would like to wait on these at this point.

## 2011-12-19 ENCOUNTER — Other Ambulatory Visit: Payer: Self-pay | Admitting: Family Medicine

## 2011-12-20 ENCOUNTER — Other Ambulatory Visit: Payer: Self-pay | Admitting: Emergency Medicine

## 2011-12-20 ENCOUNTER — Other Ambulatory Visit: Payer: Self-pay | Admitting: *Deleted

## 2011-12-20 MED ORDER — GLIPIZIDE 10 MG PO TABS: 10.0000 mg | ORAL_TABLET | Freq: Two times a day (BID) | ORAL | Status: DC

## 2011-12-20 NOTE — Telephone Encounter (Signed)
Fwd. To PCP for refill. .[name]  

## 2011-12-20 NOTE — Telephone Encounter (Signed)
Patient is calling for a refill on her Glipizide.  She has an appt on 9/11 but needs this medication before then.

## 2011-12-20 NOTE — Telephone Encounter (Signed)
Glipizide refilled for 1 month electronically.

## 2011-12-20 NOTE — Telephone Encounter (Signed)
Called pt. Informed. .[name]  

## 2012-01-04 ENCOUNTER — Ambulatory Visit (INDEPENDENT_AMBULATORY_CARE_PROVIDER_SITE_OTHER): Payer: BC Managed Care – PPO | Admitting: Emergency Medicine

## 2012-01-04 ENCOUNTER — Encounter: Payer: Self-pay | Admitting: Emergency Medicine

## 2012-01-04 VITALS — BP 139/88 | HR 108 | Ht 62.0 in | Wt 243.0 lb

## 2012-01-04 DIAGNOSIS — E119 Type 2 diabetes mellitus without complications: Secondary | ICD-10-CM

## 2012-01-04 DIAGNOSIS — I10 Essential (primary) hypertension: Secondary | ICD-10-CM

## 2012-01-04 MED ORDER — METFORMIN HCL ER 500 MG PO TB24: 500.0000 mg | ORAL_TABLET | Freq: Every day | ORAL | Status: DC

## 2012-01-04 NOTE — Patient Instructions (Addendum)
It was nice to meet you!  Blood pressure - doing well.  Continue your medicines.  Diabetes - A1c is up a little bit.  We are going to restart metformin 500mg  daily.    Please make a lab appointment for a blood draw in 1 month.  Please come to this appointment fasting (nothing to eat or drink for 8 hours except water).  Follow up with me in 3 months.

## 2012-01-04 NOTE — Progress Notes (Signed)
  Subjective:    Patient ID: [name], female    DOB: 09/08/1970, 41 y.o.   MRN: [id number]  HPI [name] is here for follow up.  Hypertension Well controlled: yes - although elevated today Compliant with medication: yes Side effects from medication: no Check BP at home: no  Chest pain: no Palpitations: no Vision changes: no Leg edema: no Dizziness: no  Diabetes Well controlled: no Compliant with medications: yes Side effects from medications: no Check sugars at home: yes  Sugar ranges: 160-170  Polyuria: no Polydipsia: no Vision changes: no Hypoglycemic symptoms: no  Foot exam: due today Eye exam: patient to schedule Microalbumin: n/a on ACEI  Cough Patient reports nasal congestion, sore throat and cough last week.  Cough has continued along with some bilateral ear "stuffiness." No fevers or shortness of breath.  I have reviewed and updated the following as appropriate: allergies and current medications SHx: never smoker  Review of Systems See HPI    Objective:   Physical Exam BP 139/88  Pulse 108  Ht 5\' 2"  (1.575 m)  Wt 243 lb (110.224 kg)  BMI 44.45 kg/m2 Gen: alert, cooperative, NAD HEENT: At/[place], sclera white, PERRL, MMM, no pharyngeal erythema or exudate, no nasal discharge, TMs normal to mildly retracted bilaterally CV: RRR, no murmurs Pulm: CTAB, no wheezes or rales Ext: no edema Foot exam: 5/5 monofilament sensation bilaterally; no bruises or lesions; does have some callused skin on top of left foot with out ulceration     Assessment & Plan:  Cough: Likely viral vs allergies.  Discussed that cough can linger several weeks after a URI.  Okay to try OTC allergy meds.

## 2012-01-04 NOTE — Assessment & Plan Note (Addendum)
A1c 7.7 today; consistent with CBGs in around 160-170 at home.  Reports being on metformin in the past with excellent control of sugars.  This was stopped due to an elevated creatinine last fall during a GI illness.  Creatinine in 07/2011 was 1.11; calculated GFR of 70.  Will continue glipizide 10mg  daily.  Will start metformin ER 500mg  daily.  Patient to come in for lab draw in 1 month to check creatinine and lipids.  Follow up in 3 months.

## 2012-01-04 NOTE — Assessment & Plan Note (Signed)
Mildly above goal of <130/80 today.  However, looking back in chart, BPs usually well controlled.  Will continue with current medications.  Follow up in 3 months.

## 2012-02-24 ENCOUNTER — Other Ambulatory Visit: Payer: BC Managed Care – PPO

## 2012-02-24 DIAGNOSIS — E119 Type 2 diabetes mellitus without complications: Secondary | ICD-10-CM

## 2012-02-24 LAB — BASIC METABOLIC PANEL
CO2: 26 mEq/L (ref 19–32)
Calcium: 9.5 mg/dL (ref 8.4–10.5)
Creat: 1.1 mg/dL (ref 0.50–1.10)

## 2012-02-24 LAB — LIPID PANEL: HDL: 33 mg/dL — ABNORMAL LOW (ref >39)

## 2012-02-24 NOTE — Progress Notes (Signed)
BMP AND FLP DONE TODAY [name] 

## 2012-03-02 ENCOUNTER — Other Ambulatory Visit: Payer: Self-pay | Admitting: Family Medicine

## 2012-03-05 ENCOUNTER — Other Ambulatory Visit: Payer: Self-pay | Admitting: Family Medicine

## 2012-03-05 ENCOUNTER — Other Ambulatory Visit: Payer: Self-pay | Admitting: *Deleted

## 2012-03-05 MED ORDER — GLIPIZIDE 10 MG PO TABS: 10.0000 mg | ORAL_TABLET | Freq: Every day | ORAL | Status: DC

## 2012-03-06 ENCOUNTER — Other Ambulatory Visit: Payer: Self-pay | Admitting: *Deleted

## 2012-03-07 MED ORDER — LISINOPRIL-HYDROCHLOROTHIAZIDE 20-25 MG PO TABS: 1.0000 | ORAL_TABLET | Freq: Every day | ORAL | Status: DC

## 2012-03-18 ENCOUNTER — Ambulatory Visit (INDEPENDENT_AMBULATORY_CARE_PROVIDER_SITE_OTHER): Payer: BC Managed Care – PPO | Admitting: Family Medicine

## 2012-03-18 VITALS — BP 112/84 | HR 106 | Temp 98.8°F | Resp 24 | Ht 63.75 in | Wt 236.2 lb

## 2012-03-18 DIAGNOSIS — J40 Bronchitis, not specified as acute or chronic: Secondary | ICD-10-CM

## 2012-03-18 MED ORDER — GUAIFENESIN ER 600 MG PO TB12: 1200.0000 mg | ORAL_TABLET | Freq: Two times a day (BID) | ORAL | Status: DC

## 2012-03-18 MED ORDER — PROMETHAZINE-CODEINE 6.25-10 MG/5ML PO SYRP: 5.0000 mL | ORAL_SOLUTION | ORAL | Status: DC | PRN

## 2012-03-18 MED ORDER — BENZONATATE 200 MG PO CAPS: 200.0000 mg | ORAL_CAPSULE | Freq: Three times a day (TID) | ORAL | Status: DC | PRN

## 2012-03-18 NOTE — Progress Notes (Signed)
Subjective:    Patient ID: [name], female    DOB: 1970-10-20, 41 y.o.   MRN: [id number] Chief Complaint  Patient presents with  . Cough    productive/white, x 2 days  . Headache  . Chest Pain    HPI  Sxs started 2 d ago w/ sore throat and cough - cough makes head hurt and ears hurting her too. Did have subj f/c yesterday. Bilateral sides/lower ribs hurt from coughing.  Tried mucinex, aleve, goody's w/ temp relief.    Past Medical History  Diagnosis Date  . Hypertension   . Allergy     seasonal  . Obesity   . Hemorrhoids   . Diabetes mellitus   . Abnormal Pap smear   . Plantar fasciitis    Current Outpatient Prescriptions on File Prior to Visit  Medication Sig Dispense Refill  . aspirin 81 MG EC tablet Take 81 mg by mouth daily.        Marland Kitchen glipiZIDE (GLUCOTROL) 10 MG tablet Take 1 tablet (10 mg total) by mouth daily with breakfast.  90 tablet  3  . Glucose Blood KIT Test blood sugar daily- fasting Dx 250.00  1 kit  0  . Lancet Devices (AUTO-LANCETS) MISC Test blood sugar as directed  Dx 250.00  100 each  12  . lisinopril-hydrochlorothiazide (PRINZIDE,ZESTORETIC) 20-25 MG per tablet Take 1 tablet by mouth daily.  30 tablet  6  . ONE TOUCH ULTRA TEST test strip TEST as directed by prescriber  100 each  12  . ranitidine (ZANTAC) 150 MG tablet take 1 tablet by mouth twice a day  60 tablet  3   No current facility-administered medications on file prior to visit.   No Known Allergies  Review of Systems  Constitutional: Positive for chills, diaphoresis and fatigue. Negative for fever and appetite change.  HENT: Positive for ear pain, sore throat and voice change. Negative for nosebleeds, congestion, rhinorrhea, sneezing, mouth sores, trouble swallowing, neck pain, neck stiffness, postnasal drip, sinus pressure and ear discharge.   Eyes: Positive for photophobia. Negative for pain.  Respiratory: Positive for cough and chest tightness. Negative for shortness of breath.     Cardiovascular: Negative for chest pain.  Gastrointestinal: Negative for nausea, vomiting, abdominal pain and constipation.  Genitourinary: Negative for dysuria, frequency and decreased urine volume.  Musculoskeletal: Positive for back pain. Negative for myalgias, joint swelling and arthralgias.  Neurological: Positive for headaches. Negative for dizziness, syncope, weakness and light-headedness.  Hematological: Negative for adenopathy.  Psychiatric/Behavioral: Positive for sleep disturbance.     BP 112/84  Pulse 106  Temp(Src) 98.8 F (37.1 C) (Oral)  Resp 24  Ht 5' 3.75" (1.619 m)  Wt 236 lb 3.2 oz (107.14 kg)  BMI 40.88 kg/m2  SpO2 94% Objective:   Physical Exam  Constitutional: She is oriented to person, place, and time. She appears well-developed and well-nourished. No distress.  HENT:  Head: Normocephalic and atraumatic.  Right Ear: External ear and ear canal normal. Tympanic membrane is injected.  Left Ear: External ear and ear canal normal. Tympanic membrane is injected.  Nose: Mucosal edema and rhinorrhea present.  Mouth/Throat: Uvula is midline and mucous membranes are normal. Posterior oropharyngeal erythema present. No oropharyngeal exudate or posterior oropharyngeal edema.  Eyes: Conjunctivae are normal. Right eye exhibits no discharge. Left eye exhibits no discharge. No scleral icterus.  Neck: Normal range of motion. Neck supple.  Cardiovascular: Normal rate, regular rhythm, normal heart sounds and intact distal pulses.  Pulmonary/Chest: Effort normal and breath sounds normal.  Lymphadenopathy:    She has no cervical adenopathy.  Neurological: She is alert and oriented to person, place, and time.  Skin: Skin is warm and dry. She is not diaphoretic. No erythema.  Psychiatric: She has a normal mood and affect. Her behavior is normal.      Assessment & Plan:  Bronchitis - Plan: promethazine-codeine (PHENERGAN WITH CODEINE) 6.25-10 MG/5ML syrup, guaiFENesin  (MUCINEX) 600 MG 12 hr tablet, benzonatate (TESSALON) 200 MG capsule  Meds ordered this encounter  Medications  . promethazine-codeine (PHENERGAN WITH CODEINE) 6.25-10 MG/5ML syrup    Sig: Take 5 mLs by mouth every 4 (four) hours as needed for cough.    Dispense:  240 mL    Refill:  0  . guaiFENesin (MUCINEX) 600 MG 12 hr tablet    Sig: Take 2 tablets (1,200 mg total) by mouth 2 (two) times daily.    Dispense:  40 tablet    Refill:  1  . benzonatate (TESSALON) 200 MG capsule    Sig: Take 1 capsule (200 mg total) by mouth 3 (three) times daily as needed for cough.    Dispense:  30 capsule    Refill:  0

## 2012-04-30 ENCOUNTER — Other Ambulatory Visit: Payer: BC Managed Care – PPO

## 2012-04-30 ENCOUNTER — Ambulatory Visit: Payer: BC Managed Care – PPO | Admitting: Surgery

## 2012-05-10 ENCOUNTER — Other Ambulatory Visit: Payer: Self-pay | Admitting: *Deleted

## 2012-05-10 MED ORDER — METFORMIN HCL ER 500 MG PO TB24: 500.0000 mg | ORAL_TABLET | Freq: Every day | ORAL | Status: DC

## 2012-05-18 ENCOUNTER — Encounter: Payer: Self-pay | Admitting: Surgery

## 2012-05-18 ENCOUNTER — Other Ambulatory Visit: Payer: Self-pay

## 2012-05-18 DIAGNOSIS — Z48812 Encounter for surgical aftercare following surgery on the circulatory system: Secondary | ICD-10-CM

## 2012-05-18 DIAGNOSIS — I771 Stricture of artery: Secondary | ICD-10-CM

## 2012-05-21 ENCOUNTER — Ambulatory Visit (INDEPENDENT_AMBULATORY_CARE_PROVIDER_SITE_OTHER): Payer: BC Managed Care – PPO | Admitting: Surgery

## 2012-05-21 ENCOUNTER — Encounter: Payer: Self-pay | Admitting: Surgery

## 2012-05-21 ENCOUNTER — Other Ambulatory Visit: Payer: BC Managed Care – PPO

## 2012-05-21 ENCOUNTER — Ambulatory Visit (INDEPENDENT_AMBULATORY_CARE_PROVIDER_SITE_OTHER): Payer: BC Managed Care – PPO | Admitting: Vascular Surgery

## 2012-05-21 VITALS — BP 125/83 | HR 94 | Resp 16 | Ht 63.5 in | Wt 241.2 lb

## 2012-05-21 DIAGNOSIS — I871 Compression of vein: Secondary | ICD-10-CM

## 2012-05-21 DIAGNOSIS — Z48812 Encounter for surgical aftercare following surgery on the circulatory system: Secondary | ICD-10-CM

## 2012-05-21 DIAGNOSIS — I70219 Atherosclerosis of native arteries of extremities with intermittent claudication, unspecified extremity: Secondary | ICD-10-CM

## 2012-05-21 NOTE — Progress Notes (Signed)
[facility] [place] HISTORY AND PHYSICAL   CC:  F/u for left iliac stent Phebe Colla, MD  HPI: This is a 42 y.o. female  here for duplex of the deep venous system of the left iliac and lower extremity.  She denies any claudication or rest pain. She has had no evidence of lower extremity swelling since her stent placement.  Past Medical History  Diagnosis Date  . Hypertension   . Allergy     seasonal  . Obesity   . Hemorrhoids   . Diabetes mellitus   . Abnormal Pap smear   . Plantar fasciitis    Past Surgical History  Procedure Date  . Abdominal hysterectomy 2005    for Fibroids  . Iliac vein angioplasty / stenting   . Cesarean section   . Tubal ligation     No Known Allergies  Current Outpatient Prescriptions  Medication Sig Dispense Refill  . amLODipine (NORVASC) 5 MG tablet take 1 tablet by mouth once daily  30 tablet  11  . aspirin 81 MG EC tablet Take 81 mg by mouth daily.        . benzonatate (TESSALON) 200 MG capsule Take 1 capsule (200 mg total) by mouth 3 (three) times daily as needed for cough.  30 capsule  0  . glipiZIDE (GLUCOTROL) 10 MG tablet Take 1 tablet (10 mg total) by mouth daily with breakfast.  90 tablet  3  . Glucose Blood KIT Test blood sugar daily- fasting Dx 250.00  1 kit  0  . guaiFENesin (MUCINEX) 600 MG 12 hr tablet Take 2 tablets (1,200 mg total) by mouth 2 (two) times daily.  40 tablet  1  . Lancet Devices (AUTO-LANCETS) MISC Test blood sugar as directed  Dx 250.00  100 each  12  . lisinopril-hydrochlorothiazide (PRINZIDE,ZESTORETIC) 20-25 MG per tablet Take 1 tablet by mouth daily.  30 tablet  6  . metFORMIN (GLUCOPHAGE-XR) 500 MG 24 hr tablet Take 1 tablet (500 mg total) by mouth daily with breakfast.  30 tablet  3  . ONE TOUCH ULTRA TEST test strip TEST as directed by prescriber  100 each  12  . promethazine-codeine (PHENERGAN WITH CODEINE) 6.25-10 MG/5ML syrup Take 5 mLs by mouth every 4 (four) hours as needed for  cough.  240 mL  0  . ranitidine (ZANTAC) 150 MG tablet take 1 tablet by mouth twice a day  60 tablet  3  . tinidazole (TINDAMAX) 500 MG tablet       . clotrimazole-betamethasone (LOTRISONE) cream Apply topically daily. For 5 days.  30 g  0  . fluticasone (FLONASE) 50 MCG/ACT nasal spray Place 2 sprays into the nose daily.  16 g  2    Family History  Problem Relation Age of Onset  . Diabetes Mother   . Hypertension Mother   . Hyperlipidemia Mother   . Heart disease Sister     [employer]  . Heart attack Neg Hx   . Sudden death Neg Hx   . Hypertension Other     History   Social History  . Marital Status: Single    Spouse Name: N/A    Number of Children: N/A  . Years of Education: N/A   Occupational History  . Not on file.   Social History Main Topics  . Smoking status: Never Smoker   . Smokeless tobacco: Never Used  . Alcohol Use: No  . Drug Use: No  . Sexually  Active: Not on file   Other Topics Concern  . Not on file   Social History Narrative  . No narrative on file     ROS: [x]  Positive   [ ]  Negative   [x ] All sytems reviewed and are negative  General: [ ]  Weight loss, [ ]  Weight gain, [ ]   Loss of appetite, [ ]  Fever Neurologic: [ ]  Dizziness, [ ]  Blackouts, [ ]  Headaches, [ ]  Seizure, Stroke, [ ]  "Mini stroke", [ ]  Slurred speech, [ ]  Temporary blindness Ear/Nose/Throat: [ ]  Change in eyesight, [ ]  Change in hearing, [ ]  Nose bleeds, [ ]  Sore throat Vascular: [ ]  Pain in legs with walking, [ ]  Pain in feet while lying flat, [ ]  Non-healing ulcer,  [ ]  Blood clot in vein, [ ]  Phlebitis Pulmonary: [ ]  Home oxygen, [ ]  Productive cough, [ ]  Bronchitis, [ ]  Coughing up blood,  [ ]  Asthma, [ ]  Wheezing Musculoskeletal: [ ]  Arthritis, [ ]  Joint pain, [ ]  Muscle pain Cardiac: [ ]  Chest pain, [ ]  Chest tightness/pressure, [ ]  Shortness of breath when lying flat, [ ]  Shortness of breath with exertion, [ ]  Palpitations, [ ]  Heart murmur, [ ]  Arrythmia,  [ ]   Atrial fibrillation Hematologic: [ ]  Bleeding problems, [ ]  Clotting disorder, [ ]  Anemia Psychiatric:  [ ]  Depression, [ ]  Anxiety Gastrointestinal:  [ ]  Black stool,[ ]   Blood in stool, [ ]  Peptic ulcer disease, [ ]  Reflux, [ ]  Hiatal hernia, [ ]  Trouble swallowing, [ ]  Diarrhea, [ ]  Constipation Urinary:  [ ]  Kidney disease, [ ]  Burning with urination, [ ]  nocturia, [ ]  Difficulty urinating Endocrine: [ ]  hx diabetes, [ ]  hx thyroid disease Skin: [ ]  Ulcers, [ ]  Rashes   PHYSICAL EXAMINATION:  Filed Vitals:   05/21/12 1612  BP: 125/83  Pulse: 94  Resp: 16   Body mass index is 42.06 kg/(m^2).  General:  WDWN in NAD Gait: Normal HENT: WNL Eyes: PERRL Pulmonary: normal non-labored breathing , without Rales, rhonchi,  wheezing Cardiac: RRR, without  Murmurs, rubs or gallops Abdomen: soft, NT, no masses Skin: no rashes, ulcers noted Vascular Exam/Pulses: palpable bilateral DP pulses; bilateral feet warm and well perfused. Extremities: without ischemic changes, no Gangrene , no cellulitis; no open wounds;  Musculoskeletal: no muscle wasting or atrophy  Neurologic: A&O X 3; Appropriate Affect ; SENSATION: normal; MOTOR FUNCTION:  moving all extremities equally. Speech is fluent/normal   Non-Invasive Vascular Imaging:   Patent left external iliac vein stent with normal spontaneous and phasic flow present.   Patent and compressible left CFV  CBC    Component Value Date/Time   WBC 7.0 02/10/2011 0954   RBC few 10/03/2011 1622   HGB 12.9 02/10/2011 0954   HCT 38.3 02/10/2011 0954   PLT 265 02/10/2011 0954   MCV 84.2 02/10/2011 0954   MCH 28.4 02/10/2011 0954   MCHC 33.7 02/10/2011 0954   RDW 13.6 02/10/2011 0954    BMET    Component Value Date/Time   NA 138 02/24/2012 0835   K 4.0 02/24/2012 0835   CL 103 02/24/2012 0835   CO2 26 02/24/2012 0835   GLUCOSE 130* 02/24/2012 0835   BUN 11 02/24/2012 0835   CREATININE 1.10 02/24/2012 0835   CREATININE 1.09 06/02/2010 2003    CALCIUM 9.5 02/24/2012 0835   GFRNONAA 59* 02/02/2007 1032   GFRAA  Value: >60        The eGFR has  been calculated using the MDRD equation. This calculation has not been validated in all clinical 02/02/2007 1032     ASSESSMENT/PLAN: 42 y.o. female who is s/p stent placement of the left external iliac vein.  -pt is doing well -her scan today is w/o evidence of thrombosis and her stent is patent. -she continues to have plantar fascitis and has seen an orthopedic MD -f/u one year with NP with aortoiliac u/s -she has been instructed to call us sooner is she starts to experience any swelling of the LLE.   Doreatha Massed, PA-C [facility] [phone]  Clinic MD Myra Gianotti  I agree with the above. The patient has been seen and examined. I have also reviewed her ultrasound which showed a widely patent iliac stent on the left. The patient remained asymptomatic. She will continue to have yearly surveillance ultrasound imaging performed. She was instructed that if she were to develop swelling prior to her ultrasound appointment to contact me for further evaluation.  Durene Cal

## 2012-05-22 ENCOUNTER — Other Ambulatory Visit: Payer: Self-pay | Admitting: *Deleted

## 2012-05-22 DIAGNOSIS — I739 Peripheral vascular disease, unspecified: Secondary | ICD-10-CM

## 2012-05-22 DIAGNOSIS — Z48812 Encounter for surgical aftercare following surgery on the circulatory system: Secondary | ICD-10-CM

## 2012-06-07 ENCOUNTER — Ambulatory Visit: Payer: BC Managed Care – PPO | Admitting: Emergency Medicine

## 2012-06-08 ENCOUNTER — Other Ambulatory Visit: Payer: Self-pay | Admitting: Family Medicine

## 2012-06-25 ENCOUNTER — Ambulatory Visit: Payer: BC Managed Care – PPO | Admitting: Emergency Medicine

## 2012-07-09 ENCOUNTER — Ambulatory Visit: Payer: BC Managed Care – PPO | Admitting: Emergency Medicine

## 2012-07-15 ENCOUNTER — Other Ambulatory Visit: Payer: Self-pay | Admitting: Family Medicine

## 2012-07-27 ENCOUNTER — Encounter: Payer: Self-pay | Admitting: Emergency Medicine

## 2012-07-27 ENCOUNTER — Ambulatory Visit (INDEPENDENT_AMBULATORY_CARE_PROVIDER_SITE_OTHER): Payer: BC Managed Care – PPO | Admitting: Emergency Medicine

## 2012-07-27 VITALS — BP 135/95 | HR 108 | Ht 63.5 in | Wt 241.0 lb

## 2012-07-27 DIAGNOSIS — J40 Bronchitis, not specified as acute or chronic: Secondary | ICD-10-CM

## 2012-07-27 DIAGNOSIS — I10 Essential (primary) hypertension: Secondary | ICD-10-CM

## 2012-07-27 DIAGNOSIS — E119 Type 2 diabetes mellitus without complications: Secondary | ICD-10-CM

## 2012-07-27 DIAGNOSIS — J019 Acute sinusitis, unspecified: Secondary | ICD-10-CM

## 2012-07-27 LAB — POCT GLYCOSYLATED HEMOGLOBIN (HGB A1C): Hemoglobin A1C: 7.4

## 2012-07-27 MED ORDER — BENZONATATE 200 MG PO CAPS: 200.0000 mg | ORAL_CAPSULE | Freq: Three times a day (TID) | ORAL | Status: DC | PRN

## 2012-07-27 MED ORDER — AMOXICILLIN-POT CLAVULANATE 875-125 MG PO TABS: 1.0000 | ORAL_TABLET | Freq: Two times a day (BID) | ORAL | Status: DC

## 2012-07-27 MED ORDER — METFORMIN HCL ER 500 MG PO TB24: 1000.0000 mg | ORAL_TABLET | Freq: Every day | ORAL | Status: DC

## 2012-07-27 MED ORDER — FLUTICASONE PROPIONATE 50 MCG/ACT NA SUSP: 2.0000 | Freq: Every day | NASAL | Status: DC

## 2012-07-27 NOTE — Assessment & Plan Note (Signed)
Mildly elevated today, but overall well controlled.  Continue current medications.  Follow up in 3 months.

## 2012-07-27 NOTE — Patient Instructions (Addendum)
It was nice to see you! For the sinus infection.... - take Augmentin (an antibiotic) 1 pill twice a day for 10 days - pick up some nasal saline spray and use it 3-4 times a day - do not use Flonase until AFTER you have completed the antibiotics If you start having fevers or continue to have pus-like drainage from your nose after you complete the antibiotic, please come back. Follow up in 3 months to recheck your diabetes.

## 2012-07-27 NOTE — Assessment & Plan Note (Signed)
Improved control with A1c of 7.4 today.  Will increase metform xr to 1g daily.  Follow up in 3 months for A1c and BMP.

## 2012-07-27 NOTE — Progress Notes (Signed)
  Subjective:    Patient ID: [name], female    DOB: 07-14-1970, 42 y.o.   MRN: [id number]  HPI [name] is here for f/u of DM and sinus problem.  Diabetes Well controlled: no Compliant with medications: yes Side effects from medications: no Check sugars at home: yes  Sugar ranges: 133 this am  Polyuria: no Polydipsia: no Vision changes: no Hypoglycemic symptoms: no  Eye exam: normal 06/2012 Microalbumin: n/a on ACE-I  Hypertension Well controlled: yes - mildly elevated today, but review of chart shows mostly well controlled Compliant with medication: yes Side effects from medication: no Check BP at home: no  Chest pain: no Palpitations: no Vision changes: no Leg edema: no Dizziness: no  Sinus problem She reports having some nasal congestion about 2 weeks ago.  This started getting better, but then got worse again about 1 week ago.  Initially with greenish discharge, now white with some blood.  Nose feels very sore and sometimes has a burning sensation.  Also feels like ears are plugged.  No fevers, n/v/d.    I have reviewed and updated the following as appropriate: allergies and current medications SHx: never smoker  Review of Systems See HPI    Objective:   Physical Exam BP 135/95  Pulse 108  Ht 5' 3.5" (1.613 m)  Wt 241 lb (109.317 kg)  BMI 42.02 kg/m2 Gen: alert, cooperative, NAD HEENT: AT/[place], sclera white, MMM, TMs normal bilaterally, tenderness over bilateral maxillary and frontal sinuses, shallow ulceration seen on left nasal turbinate Neck: supple, no LAD CV: RRR, no murmurs Pulm: CTAB, no wheezes or rales Ext: no edema     Assessment & Plan:

## 2012-07-27 NOTE — Assessment & Plan Note (Signed)
Will treat for bacterial sinusitis with augmentin BID x10 days.  Also instructed to use nasal saline spray.  Hold Flonase until completed antibiotics.  Reasons to return reviewed.

## 2012-08-08 ENCOUNTER — Telehealth: Payer: Self-pay | Admitting: Emergency Medicine

## 2012-08-08 DIAGNOSIS — J40 Bronchitis, not specified as acute or chronic: Secondary | ICD-10-CM

## 2012-08-08 NOTE — Telephone Encounter (Signed)
Patient has been waiting for refill on her test strips but the request went to Dr. Alvester Morin instead of coming to [facility].  Can we please put a rush on getting test strips to her.  Also, the cough syrup that was discussed at the last appt, she had gotten when she had Bronchits is, promethazine-codeine (PHENERGAN WITH CODEINE) 6.25-10 MG/5ML syrup 240 mL 0 03/18/2012 Take 5 mLs by mouth every 4 (four) hours as needed for cough. - Oral

## 2012-08-08 NOTE — Telephone Encounter (Signed)
Called patient, she is needing test strips sent to the [facility] on [address], also requesting that the cough syrup be sent in.Josean Lycan, Rodena Medin

## 2012-08-09 MED ORDER — PROMETHAZINE-CODEINE 6.25-10 MG/5ML PO SYRP: 5.0000 mL | ORAL_SOLUTION | ORAL | Status: DC | PRN

## 2012-08-09 MED ORDER — GLUCOSE BLOOD VI STRP: 1.0000 | ORAL_STRIP | Freq: Every day | Status: DC | PRN

## 2012-08-09 NOTE — Telephone Encounter (Signed)
Called patient and told her test strips were sent to pharmacy. Also that she needs to try OTC cough meds first and if that doesn't help she will need to be seen.Busick, Rodena Medin

## 2012-08-09 NOTE — Telephone Encounter (Signed)
Refill of strips sent in electronically.  Has not been given the cough syrup since last November.  She can try OTC cough medicines, but needs to be seen before that cough syrup can be refilled (due to the codeine part of it).

## 2012-10-04 ENCOUNTER — Encounter (INDEPENDENT_AMBULATORY_CARE_PROVIDER_SITE_OTHER): Payer: BC Managed Care – PPO | Admitting: *Deleted

## 2012-10-04 ENCOUNTER — Telehealth: Payer: Self-pay

## 2012-10-04 DIAGNOSIS — M7989 Other specified soft tissue disorders: Secondary | ICD-10-CM

## 2012-10-04 DIAGNOSIS — M79662 Pain in left lower leg: Secondary | ICD-10-CM

## 2012-10-04 DIAGNOSIS — R200 Anesthesia of skin: Secondary | ICD-10-CM

## 2012-10-04 NOTE — Telephone Encounter (Signed)
Pt. called to report approx. 1 wk. Hx. Of "cramping and tightening of left ankle and calf"  States this is "continuous".  Describes a "tingling" sensation of left calf.  Denies any redness, warmth, inflammation of left calf/ankle.  States there was moderate swelling in ankle on Saturday, but denies any swelling today.  Discussed with Dr. Darrick Penna.  Recommends to bring pt. in today for venous duplex to rule out DVT.  Pt. notified.  Verb. Understanding.

## 2012-10-08 ENCOUNTER — Other Ambulatory Visit: Payer: Self-pay | Admitting: Family Medicine

## 2012-10-08 ENCOUNTER — Other Ambulatory Visit: Payer: Self-pay | Admitting: *Deleted

## 2012-10-08 MED ORDER — LISINOPRIL-HYDROCHLOROTHIAZIDE 20-25 MG PO TABS: 1.0000 | ORAL_TABLET | Freq: Every day | ORAL | Status: DC

## 2012-10-09 ENCOUNTER — Other Ambulatory Visit: Payer: Self-pay | Admitting: *Deleted

## 2012-10-09 MED ORDER — RANITIDINE HCL 150 MG PO TABS: ORAL_TABLET | ORAL | Status: DC

## 2012-10-25 ENCOUNTER — Encounter: Payer: Self-pay | Admitting: Surgery

## 2012-10-29 ENCOUNTER — Encounter: Payer: Self-pay | Admitting: Surgery

## 2012-10-29 ENCOUNTER — Ambulatory Visit (INDEPENDENT_AMBULATORY_CARE_PROVIDER_SITE_OTHER): Payer: BC Managed Care – PPO | Admitting: Surgery

## 2012-10-29 DIAGNOSIS — I6529 Occlusion and stenosis of unspecified carotid artery: Secondary | ICD-10-CM

## 2012-10-29 NOTE — Progress Notes (Signed)
[facility] [place]   Patient name: [name] MRN: [id number] DOB: 05/27/1970 Sex: female     Chief Complaint  Patient presents with  . Re-evaluation    absnormal results on carotid duplex    HISTORY OF PRESENT ILLNESS: The patient comes back today for followup of her left external iliac vein stents. This was placed in December of 2008. It was a Cordis 12 x 60 self-expanding stent. It was placed for tightness and swelling in her left leg. She had a CT scan which showed compression of her left external iliac vein from lymph nodes. 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 The patient has done exceptionally well over the past 6 years, however approximately to 3 weeks ago she began experiencing tightness in her left leg. She has also noticed some mild swelling in both feet.  Patient continues to be medically managed for diabetes. She has stage II chronic renal insufficiency.  Past Medical History  Diagnosis Date  . Hypertension   . Allergy     seasonal  . Obesity   . Hemorrhoids   . Diabetes mellitus   . Abnormal Pap smear   . Plantar fasciitis     Past Surgical History  Procedure Laterality Date  . Abdominal hysterectomy  2005    for Fibroids  . Iliac vein angioplasty / stenting    . Cesarean section    . Tubal ligation      History   Social History  . Marital Status: Single    Spouse Name: N/A    Number of Children: N/A  . Years of Education: N/A   Occupational History  . Not on file.   Social History Main Topics  . Smoking status: Never Smoker   . Smokeless tobacco: Never Used  . Alcohol Use: No  . Drug Use: No  . Sexually Active: Not on file   Other Topics Concern  . Not on file   Social History Narrative  . No narrative on file    Family History  Problem Relation Age of Onset  . Diabetes Mother   . Hypertension Mother   . Hyperlipidemia Mother   . Heart disease Sister     [employer]  . Heart  attack Neg Hx   . Sudden death Neg Hx   . Hypertension Other     Allergies as of 10/29/2012  . (No Known Allergies)    Current Outpatient Prescriptions on File Prior to Visit  Medication Sig Dispense Refill  . amLODipine (NORVASC) 5 MG tablet take 1 tablet by mouth once daily  30 tablet  11  . aspirin 81 MG EC tablet Take 81 mg by mouth daily.        . fluticasone (FLONASE) 50 MCG/ACT nasal spray Place 2 sprays into the nose daily.  16 g  2  . glipiZIDE (GLUCOTROL) 10 MG tablet Take 1 tablet (10 mg total) by mouth daily with breakfast.  90 tablet  3  . glucose blood (ONE TOUCH ULTRA TEST) test strip 1 each by Other route daily as needed for other (symptoms of hypo- or hyperglycemia). Use as instructed  100 each  12  . Glucose Blood KIT Test blood sugar daily- fasting Dx 250.00  1 kit  0  . guaiFENesin (MUCINEX) 600 MG 12 hr tablet Take 2 tablets (1,200 mg total) by mouth 2 (two) times daily.  40 tablet  1  . Lancet Devices (AUTO-LANCETS) MISC Test blood sugar   as directed  Dx 250.00  100 each  12  . lisinopril-hydrochlorothiazide (PRINZIDE,ZESTORETIC) 20-25 MG per tablet Take 1 tablet by mouth daily.  30 tablet  6  . metFORMIN (GLUCOPHAGE-XR) 500 MG 24 hr tablet Take 2 tablets (1,000 mg total) by mouth daily with breakfast.  60 tablet  3  . ranitidine (ZANTAC) 150 MG tablet take 1 tablet by mouth twice a day  60 tablet  3  . amoxicillin-clavulanate (AUGMENTIN) 875-125 MG per tablet Take 1 tablet by mouth 2 (two) times daily.  20 tablet  0  . benzonatate (TESSALON) 200 MG capsule Take 1 capsule (200 mg total) by mouth 3 (three) times daily as needed for cough.  30 capsule  0  . promethazine-codeine (PHENERGAN WITH CODEINE) 6.25-10 MG/5ML syrup Take 5 mLs by mouth every 4 (four) hours as needed for cough.  240 mL  0   No current facility-administered medications on file prior to visit.     REVIEW OF SYSTEMS: Cardiovascular: No chest pain, chest pressure, palpitations, orthopnea, or  dyspnea on exertion.  Pulmonary: No productive cough, asthma or wheezing. Neurologic: No weakness, paresthesias, aphasia, or amaurosis. No dizziness. Hematologic: No bleeding problems or clotting disorders. Musculoskeletal: No joint pain or joint swelling. Gastrointestinal: No blood in stool or hematemesis Genitourinary: No dysuria or hematuria. Psychiatric:: No history of major depression. Integumentary: No rashes or ulcers. Constitutional: No fever or chills.  PHYSICAL EXAMINATION:   Vital signs are BP 131/96  Pulse 85  Ht 5' 3.5" (1.613 m)  Wt 242 lb (109.77 kg)  BMI 42.19 kg/m2  SpO2 100% General: The patient appears their stated age. HEENT:  No gross abnormalities Pulmonary:  Non labored breathing Musculoskeletal: There are no major deformities. Neurologic: No focal weakness or paresthesias are detected, Skin: There are no ulcer or rashes noted. Psychiatric: The patient has normal affect. Cardiovascular: There is a regular rate and rhythm without significant murmur appreciated. Palpable pedal pulses bilaterally. Minimal edema.   Diagnostic Studies I have reviewed her ultrasound from 2 weeks ago which shows that the stent is widely patent and there is no evidence of deep venous obstruction or reflux.  Assessment: Recurrent left leg tightness and swelling Plan: Despite having a normal ultrasound, I feel that sometimes duplex can be in accurate in the iliac system particularly when dealing with an obese patient. Because she has a history of iliac vein stenting, and it has been 6 years since the original implantation date, I have recommended formal venography of the left iliac vein. If a stenosis is identified it would be dilated at the time. The patient is in agreement with this plan. She is scheduled for Wednesday, July 16th. As little contrast as possible will be used secondary to her request these 2 renal insufficiency  V. Wells Lorisa Scheid IV, M.D. [facility] [place] Office: [phone] Pager:  [phone]   

## 2012-11-01 ENCOUNTER — Other Ambulatory Visit: Payer: Self-pay

## 2012-11-01 ENCOUNTER — Encounter (HOSPITAL_COMMUNITY): Payer: Self-pay | Admitting: Pharmacy Technician

## 2012-11-07 ENCOUNTER — Other Ambulatory Visit: Payer: Self-pay | Admitting: *Deleted

## 2012-11-07 ENCOUNTER — Ambulatory Visit (HOSPITAL_COMMUNITY)
Admission: RE | Admit: 2012-11-07 | Discharge: 2012-11-07 | Disposition: A | Discharge status: Home / Self Care | Payer: BC Managed Care – PPO | Source: Ambulatory Visit | Attending: Surgery | Admitting: Surgery

## 2012-11-07 ENCOUNTER — Encounter (HOSPITAL_COMMUNITY)
Admission: RE | Disposition: A | Discharge status: Home / Self Care | Payer: Self-pay | Source: Ambulatory Visit | Attending: Surgery

## 2012-11-07 ENCOUNTER — Telehealth: Payer: Self-pay | Admitting: Surgery

## 2012-11-07 DIAGNOSIS — I708 Atherosclerosis of other arteries: Secondary | ICD-10-CM | POA: Insufficient documentation

## 2012-11-07 DIAGNOSIS — Z8249 Family history of ischemic heart disease and other diseases of the circulatory system: Secondary | ICD-10-CM | POA: Insufficient documentation

## 2012-11-07 DIAGNOSIS — T82898A Other specified complication of vascular prosthetic devices, implants and grafts, initial encounter: Secondary | ICD-10-CM | POA: Insufficient documentation

## 2012-11-07 DIAGNOSIS — Z48812 Encounter for surgical aftercare following surgery on the circulatory system: Secondary | ICD-10-CM

## 2012-11-07 DIAGNOSIS — E669 Obesity, unspecified: Secondary | ICD-10-CM | POA: Insufficient documentation

## 2012-11-07 DIAGNOSIS — Y831 Surgical operation with implant of artificial internal device as the cause of abnormal reaction of the patient, or of later complication, without mention of misadventure at the time of the procedure: Secondary | ICD-10-CM | POA: Insufficient documentation

## 2012-11-07 DIAGNOSIS — I129 Hypertensive chronic kidney disease with stage 1 through stage 4 chronic kidney disease, or unspecified chronic kidney disease: Secondary | ICD-10-CM | POA: Insufficient documentation

## 2012-11-07 DIAGNOSIS — Z6841 Body Mass Index (BMI) 40.0 and over, adult: Secondary | ICD-10-CM | POA: Insufficient documentation

## 2012-11-07 DIAGNOSIS — J301 Allergic rhinitis due to pollen: Secondary | ICD-10-CM | POA: Insufficient documentation

## 2012-11-07 DIAGNOSIS — Z7982 Long term (current) use of aspirin: Secondary | ICD-10-CM | POA: Insufficient documentation

## 2012-11-07 DIAGNOSIS — I739 Peripheral vascular disease, unspecified: Secondary | ICD-10-CM

## 2012-11-07 DIAGNOSIS — M7989 Other specified soft tissue disorders: Secondary | ICD-10-CM

## 2012-11-07 DIAGNOSIS — M722 Plantar fascial fibromatosis: Secondary | ICD-10-CM | POA: Insufficient documentation

## 2012-11-07 DIAGNOSIS — E119 Type 2 diabetes mellitus without complications: Secondary | ICD-10-CM | POA: Insufficient documentation

## 2012-11-07 DIAGNOSIS — N182 Chronic kidney disease, stage 2 (mild): Secondary | ICD-10-CM | POA: Insufficient documentation

## 2012-11-07 DIAGNOSIS — Z79899 Other long term (current) drug therapy: Secondary | ICD-10-CM | POA: Insufficient documentation

## 2012-11-07 HISTORY — PX: VENOGRAM: SHX5497

## 2012-11-07 LAB — POCT I-STAT, CHEM 8
Chloride: 102 mEq/L (ref 96–112)
Glucose, Bld: 131 mg/dL — ABNORMAL HIGH (ref 70–99)
HCT: 42 % (ref 36.0–46.0)
Potassium: 3.8 mEq/L (ref 3.5–5.1)
Sodium: 142 mEq/L (ref 135–145)

## 2012-11-07 SURGERY — VENOGRAM
Anesthesia: LOCAL

## 2012-11-07 MED ORDER — SODIUM CHLORIDE 0.9 % IV SOLN: 1.0000 mL/kg/h | INTRAVENOUS | Status: DC

## 2012-11-07 MED ORDER — SODIUM CHLORIDE 0.9 % IV SOLN
INTRAVENOUS | Status: DC
  Administered 2012-11-07: 07:00:00 via INTRAVENOUS

## 2012-11-07 MED ORDER — FENTANYL CITRATE 0.05 MG/ML IJ SOLN
INTRAMUSCULAR | Status: AC
  Filled 2012-11-07: qty 2

## 2012-11-07 MED ORDER — LIDOCAINE HCL (PF) 1 % IJ SOLN
INTRAMUSCULAR | Status: AC
  Filled 2012-11-07: qty 30

## 2012-11-07 MED ORDER — HEPARIN (PORCINE) IN NACL 2-0.9 UNIT/ML-% IJ SOLN
INTRAMUSCULAR | Status: AC
  Filled 2012-11-07: qty 500

## 2012-11-07 NOTE — Telephone Encounter (Addendum)
Message copied by Rosalyn Charters on Wed Nov 07, 2012 10:58 AM ------      Message from: Melene Plan      Created: Wed Nov 07, 2012  9:27 AM                   ----- Message -----         From: Nada Libman, MD         Sent: 11/07/2012   9:22 AM           To: Reuel Derby, Melene Plan, RN            11/07/2012:            Surgeon:  Jorge Ny      Procedure Performed:       1.  ultrasound-guided access, common femoral vein       2.  ilio-caval venogram       3.  angioplasty, left external iliac vein       4.  followup x1            Schedule the patient to followup with me in 6 months with a duplex ultrasound of her inferior vena cava and iliac venous systemnotified patient of fu appt. with dr. Myra Gianotti on [phone] at 8:30   ------notified patient of fu appt. with dr. Myra Gianotti on [phone] at 8:30

## 2012-11-07 NOTE — Op Note (Signed)
[facility]  Patient name: [name] MRN: [id number] DOB: 03/05/1971 Sex: female  11/07/2012 Pre-operative Diagnosis: Left leg swelling Post-operative diagnosis:  Same Surgeon:  Jorge Ny Procedure Performed:  1.  ultrasound-guided access, common femoral vein  2.  ilio-caval venogram  3.  angioplasty, left external iliac vein  4.  followup x1    Indications:  The patient has a history of left external iliac vein stenting performed in 2008. This was done the setting of severe left leg swelling. She had resolution of her symptoms for many years, however over the past several months has had recurrence of tightness and swelling in her left leg. She comes in today for further evaluation of her previously placed stents.  Procedure:  The patient was identified in the holding area and taken to room 8.  The patient was then placed supine on the table and prepped and draped in the usual sterile fashion.  A time out was called.  Ultrasound was used to evaluate the left common femoral vein.  It was patent and compressible. 1% lidocaine was used for local anesthesia. Under ultrasound guidance the left common femoral vein was cannulated with an 18-gauge needle. An 30 Bentson wire was advanced without resistance into the vena cava. A 5 French sheath was placed. Contrast injections were performed through the sheath.  Findings:   Iliac venous system:  A stent is visualized within the left external iliac vein which has approximately a 60% stenosis within its midportion. The internal iliac vein is widely patent. The common iliac vein is widely patent.  Inferior vena cava:  The visualized portions of the inferior vena cava are widely patent.   Intervention:  After the above images were acquired, the decision was made to proceed with intervention. A 7 French sheath was placed. I did not administer heparin. I first perform balloon venoplasty of in-stent stenosis with a  10 x 40 balloon, taking it to burst pressure. A followup study revealed improved results but still residual stenosis. Therefore, I elected to up size to a 12 x 40 balloon. Again the balloon was taken to burst pressure and held for 30 seconds. Followup study revealed significant improvement within the in-stent stenosis. Residual stenosis was approximately 10-15%. At this point, the decision was made to terminate the procedure. Wires were removed. The patient taken the holding area for sheath pull. There were no complications.  Impression:  #1  successful balloon dilatation of in-stent stenosis within the left external iliac vein in-stent. A 12 mm balloon was utilized.  #2  the remaining portion of the visualized ilio-caval  system was widely patent   V. Durene Cal, M.D. [facility] Office: [phone] Pager:  [phone]

## 2012-11-07 NOTE — Interval H&P Note (Signed)
History and Physical Interval Note:  11/07/2012 8:38 AM  [name]  has presented today for surgery, with the diagnosis of Swelling  The various methods of treatment have been discussed with the patient and family. After consideration of risks, benefits and other options for treatment, the patient has consented to  Procedure(s): VENOGRAM (N/A) as a surgical intervention .  The patient's history has been reviewed, patient examined, no change in status, stable for surgery.  I have reviewed the patient's chart and labs.  Questions were answered to the patient's satisfaction.     BRABHAM IV, V. WELLS

## 2012-11-07 NOTE — H&P (View-Only) (Signed)
[facility]   Patient name: [name] MRN: [id number] DOB: 1970/05/31 Sex: female     Chief Complaint  Patient presents with  . Re-evaluation    absnormal results on carotid duplex    HISTORY OF PRESENT ILLNESS: The patient comes back today for followup of her left external iliac vein stents. This was placed in December of 2008. It was a Cordis 12 x 60 self-expanding stent. It was placed for tightness and swelling in her left leg. She had a CT scan which showed compression of her left external iliac vein from lymph nodes. 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 The patient has done exceptionally well over the past 6 years, however approximately to 3 weeks ago she began experiencing tightness in her left leg. She has also noticed some mild swelling in both feet.  Patient continues to be medically managed for diabetes. She has stage II chronic renal insufficiency.  Past Medical History  Diagnosis Date  . Hypertension   . Allergy     seasonal  . Obesity   . Hemorrhoids   . Diabetes mellitus   . Abnormal Pap smear   . Plantar fasciitis     Past Surgical History  Procedure Laterality Date  . Abdominal hysterectomy  2005    for Fibroids  . Iliac vein angioplasty / stenting    . Cesarean section    . Tubal ligation      History   Social History  . Marital Status: Single    Spouse Name: N/A    Number of Children: N/A  . Years of Education: N/A   Occupational History  . Not on file.   Social History Main Topics  . Smoking status: Never Smoker   . Smokeless tobacco: Never Used  . Alcohol Use: No  . Drug Use: No  . Sexually Active: Not on file   Other Topics Concern  . Not on file   Social History Narrative  . No narrative on file    Family History  Problem Relation Age of Onset  . Diabetes Mother   . Hypertension Mother   . Hyperlipidemia Mother   . Heart disease Sister     [employer]  . Heart  attack Neg Hx   . Sudden death Neg Hx   . Hypertension Other     Allergies as of 10/29/2012  . (No Known Allergies)    Current Outpatient Prescriptions on File Prior to Visit  Medication Sig Dispense Refill  . amLODipine (NORVASC) 5 MG tablet take 1 tablet by mouth once daily  30 tablet  11  . aspirin 81 MG EC tablet Take 81 mg by mouth daily.        . fluticasone (FLONASE) 50 MCG/ACT nasal spray Place 2 sprays into the nose daily.  16 g  2  . glipiZIDE (GLUCOTROL) 10 MG tablet Take 1 tablet (10 mg total) by mouth daily with breakfast.  90 tablet  3  . glucose blood (ONE TOUCH ULTRA TEST) test strip 1 each by Other route daily as needed for other (symptoms of hypo- or hyperglycemia). Use as instructed  100 each  12  . Glucose Blood KIT Test blood sugar daily- fasting Dx 250.00  1 kit  0  . guaiFENesin (MUCINEX) 600 MG 12 hr tablet Take 2 tablets (1,200 mg total) by mouth 2 (two) times daily.  40 tablet  1  . Lancet Devices (AUTO-LANCETS) MISC Test blood sugar  as directed  Dx 250.00  100 each  12  . lisinopril-hydrochlorothiazide (PRINZIDE,ZESTORETIC) 20-25 MG per tablet Take 1 tablet by mouth daily.  30 tablet  6  . metFORMIN (GLUCOPHAGE-XR) 500 MG 24 hr tablet Take 2 tablets (1,000 mg total) by mouth daily with breakfast.  60 tablet  3  . ranitidine (ZANTAC) 150 MG tablet take 1 tablet by mouth twice a day  60 tablet  3  . amoxicillin-clavulanate (AUGMENTIN) 875-125 MG per tablet Take 1 tablet by mouth 2 (two) times daily.  20 tablet  0  . benzonatate (TESSALON) 200 MG capsule Take 1 capsule (200 mg total) by mouth 3 (three) times daily as needed for cough.  30 capsule  0  . promethazine-codeine (PHENERGAN WITH CODEINE) 6.25-10 MG/5ML syrup Take 5 mLs by mouth every 4 (four) hours as needed for cough.  240 mL  0   No current facility-administered medications on file prior to visit.     REVIEW OF SYSTEMS: Cardiovascular: No chest pain, chest pressure, palpitations, orthopnea, or  dyspnea on exertion.  Pulmonary: No productive cough, asthma or wheezing. Neurologic: No weakness, paresthesias, aphasia, or amaurosis. No dizziness. Hematologic: No bleeding problems or clotting disorders. Musculoskeletal: No joint pain or joint swelling. Gastrointestinal: No blood in stool or hematemesis Genitourinary: No dysuria or hematuria. Psychiatric:: No history of major depression. Integumentary: No rashes or ulcers. Constitutional: No fever or chills.  PHYSICAL EXAMINATION:   Vital signs are BP 131/96  Pulse 85  Ht 5' 3.5" (1.613 m)  Wt 242 lb (109.77 kg)  BMI 42.19 kg/m2  SpO2 100% General: The patient appears their stated age. HEENT:  No gross abnormalities Pulmonary:  Non labored breathing Musculoskeletal: There are no major deformities. Neurologic: No focal weakness or paresthesias are detected, Skin: There are no ulcer or rashes noted. Psychiatric: The patient has normal affect. Cardiovascular: There is a regular rate and rhythm without significant murmur appreciated. Palpable pedal pulses bilaterally. Minimal edema.   Diagnostic Studies I have reviewed her ultrasound from 2 weeks ago which shows that the stent is widely patent and there is no evidence of deep venous obstruction or reflux.  Assessment: Recurrent left leg tightness and swelling Plan: Despite having a normal ultrasound, I feel that sometimes duplex can be in accurate in the iliac system particularly when dealing with an obese patient. Because she has a history of iliac vein stenting, and it has been 6 years since the original implantation date, I have recommended formal venography of the left iliac vein. If a stenosis is identified it would be dilated at the time. The patient is in agreement with this plan. She is scheduled for Wednesday, July 16th. As little contrast as possible will be used secondary to her request these 2 renal insufficiency  V. Charlena Cross, M.D. [facility] Office: [phone] Pager:  [phone]

## 2012-12-13 ENCOUNTER — Ambulatory Visit: Payer: BC Managed Care – PPO | Admitting: Emergency Medicine

## 2012-12-17 ENCOUNTER — Ambulatory Visit (INDEPENDENT_AMBULATORY_CARE_PROVIDER_SITE_OTHER): Payer: BC Managed Care – PPO | Admitting: Emergency Medicine

## 2012-12-17 ENCOUNTER — Encounter: Payer: Self-pay | Admitting: Emergency Medicine

## 2012-12-17 VITALS — BP 135/95 | HR 108 | Temp 98.7°F | Ht 63.0 in | Wt 238.0 lb

## 2012-12-17 DIAGNOSIS — M533 Sacrococcygeal disorders, not elsewhere classified: Secondary | ICD-10-CM | POA: Insufficient documentation

## 2012-12-17 DIAGNOSIS — E119 Type 2 diabetes mellitus without complications: Secondary | ICD-10-CM

## 2012-12-17 MED ORDER — MUPIROCIN 2 % EX OINT: TOPICAL_OINTMENT | Freq: Three times a day (TID) | CUTANEOUS | Status: DC

## 2012-12-17 MED ORDER — MELOXICAM 7.5 MG PO TABS: 7.5000 mg | ORAL_TABLET | Freq: Every day | ORAL | Status: DC

## 2012-12-17 MED ORDER — PROMETHAZINE-CODEINE 6.25-10 MG/5ML PO SYRP: 5.0000 mL | ORAL_SOLUTION | ORAL | Status: DC | PRN

## 2012-12-17 NOTE — Progress Notes (Signed)
  Subjective:    Patient ID: [name], female    DOB: 11-25-1970, 42 y.o.   MRN: [id number]  HPI [name] is here for diabetes and low back pain.  Diabetes Compliant with medications: yes Side effects from medications: no Check sugars at home: yes  Sugar ranges: 100s-130s  Polyuria: no Polydipsia: no Vision changes: no Hypoglycemic symptoms: yes resolves with snack  Foot exam: due today Eye exam: sees Dr. Sherryle Lis Microalbumin: n/a on ACE-I  Right low back pain She reports intermittent pain in her right lower back that has become more constant over the last month.  This initially started last September.  Denies any known triggers or injury.  Worse with forward flexion.  Feels stiff.  No radiation, no numbness, tingling or weakness in right leg.  I have reviewed and updated the following as appropriate: allergies and current medications SHx: never smoker - she had repeat angioplasty of stenosis in left iliac artery last month  Review of Systems See HPI    Objective:   Physical Exam BP 135/95  Pulse 108  Temp(Src) 98.7 F (37.1 C) (Oral)  Ht 5\' 3"  (1.6 m)  Wt 238 lb (107.956 kg)  BMI 42.17 kg/m2 Gen: alert, cooperative, NAD HEENT: AT/[place], sclera white, MMM Neck: supple CV: RRR, no murmurs Pulm: CTAB, no wheezes or rales Ext: no edema Foot exam: 5/5 to monofilament on right, 3/5 on left; no rashes, calluses or ulcers Back: no vertebral stepoffs; tender over right SI joint; SLR negative; FABER positive on right     Assessment & Plan:

## 2012-12-17 NOTE — Assessment & Plan Note (Addendum)
A1c at 7.0%. Continue metformin and glipizide. Sees eye doctor annually - Dr. Sherryle Lis. Foot exam done today. Follow up in 3 months.

## 2012-12-17 NOTE — Patient Instructions (Addendum)
It was nice to see you!  Your A1c is much better at 7.0%. No change in medications.  You back pain is coming from the SI joint. Take Meloxicam 1 tablet daily for 1 week.  Do not take with ibuprofen, motrin, aleve, or advil. Do the exercises once a day.  Follow up in 3 months.

## 2012-12-17 NOTE — Assessment & Plan Note (Signed)
Will treat with meloxicam 7.5mg  daily x7 days. Exercises given.

## 2012-12-20 ENCOUNTER — Other Ambulatory Visit: Payer: Self-pay | Admitting: *Deleted

## 2012-12-20 DIAGNOSIS — E119 Type 2 diabetes mellitus without complications: Secondary | ICD-10-CM

## 2012-12-20 MED ORDER — METFORMIN HCL ER 500 MG PO TB24: 1000.0000 mg | ORAL_TABLET | Freq: Every day | ORAL | Status: DC

## 2013-03-12 ENCOUNTER — Other Ambulatory Visit: Payer: Self-pay | Admitting: Surgery

## 2013-03-12 DIAGNOSIS — Z48812 Encounter for surgical aftercare following surgery on the circulatory system: Secondary | ICD-10-CM

## 2013-03-12 DIAGNOSIS — I871 Compression of vein: Secondary | ICD-10-CM

## 2013-04-25 DIAGNOSIS — E785 Hyperlipidemia, unspecified: Secondary | ICD-10-CM

## 2013-04-25 HISTORY — DX: Hyperlipidemia, unspecified: E78.5

## 2013-05-10 ENCOUNTER — Encounter: Payer: Self-pay | Admitting: Surgery

## 2013-05-13 ENCOUNTER — Inpatient Hospital Stay (HOSPITAL_COMMUNITY): Admission: RE | Admit: 2013-05-13 | Payer: BC Managed Care – PPO | Source: Ambulatory Visit

## 2013-05-13 ENCOUNTER — Ambulatory Visit: Payer: BC Managed Care – PPO | Admitting: Surgery

## 2013-05-20 ENCOUNTER — Telehealth: Payer: Self-pay | Admitting: Emergency Medicine

## 2013-05-20 ENCOUNTER — Ambulatory Visit: Payer: BC Managed Care – PPO | Admitting: Neurosurgery

## 2013-05-20 ENCOUNTER — Telehealth: Payer: Self-pay | Admitting: *Deleted

## 2013-05-20 DIAGNOSIS — E119 Type 2 diabetes mellitus without complications: Secondary | ICD-10-CM

## 2013-05-20 NOTE — Telephone Encounter (Signed)
Pt called and needs refills on her medications. Amlodipine, ranitidine, metformin, and lisinopril. jw

## 2013-05-20 NOTE — Telephone Encounter (Signed)
Will fwd to MD.  Jinna Weinman L, CMA  

## 2013-05-20 NOTE — Telephone Encounter (Signed)
Pt. concerned about "tingling sensation in my left thigh". States the tingling sensation "comes and goes."  Denies significant swelling except for when standing for prolonged periods of time.  Denies redness, irritation, fever/chills.  Pt. states she is changing jobs and insurance.  Discussed pt'.s symptoms with Dr. Trula Slade who asked pt. to continue monitoring and report worsening symptoms.  Specifically asked pt. to report left leg swelling and left leg pain and to notify office if these symptoms occur.  Encouraged pt. to reschedule follow up appt. with Dr. Trula Slade and labs (duplex ultrasound of inferior vena cava and iliac venous system) when she is able.

## 2013-05-21 MED ORDER — LISINOPRIL-HYDROCHLOROTHIAZIDE 20-25 MG PO TABS: 1.0000 | ORAL_TABLET | Freq: Every day | ORAL | Status: DC

## 2013-05-21 MED ORDER — METFORMIN HCL ER 500 MG PO TB24: 1000.0000 mg | ORAL_TABLET | Freq: Every day | ORAL | Status: DC

## 2013-05-21 MED ORDER — AMLODIPINE BESYLATE 5 MG PO TABS: 5.0000 mg | ORAL_TABLET | Freq: Every day | ORAL | Status: DC

## 2013-05-21 MED ORDER — RANITIDINE HCL 150 MG PO TABS: 150.0000 mg | ORAL_TABLET | Freq: Two times a day (BID) | ORAL | Status: DC

## 2013-05-21 NOTE — Telephone Encounter (Signed)
I have sent a 3 month supply of those medications to her pharmacy on file.  She will need an appointment with me in the next 3 months before she can get additional refills.

## 2013-05-21 NOTE — Telephone Encounter (Signed)
Called pt. Unable to leave message 'This prescriber does not accept incoming calls'. Waiting for pt to call back. Pt needs OV. Javier Glazier, Gerrit Heck

## 2013-05-27 ENCOUNTER — Ambulatory Visit: Payer: BC Managed Care – PPO | Admitting: Neurosurgery

## 2013-06-28 ENCOUNTER — Ambulatory Visit: Payer: No Typology Code available for payment source | Admitting: Family Medicine

## 2013-06-28 VITALS — BP 132/98 | HR 83 | Temp 98.0°F | Resp 16 | Ht 63.0 in | Wt 236.0 lb

## 2013-06-28 DIAGNOSIS — I10 Essential (primary) hypertension: Secondary | ICD-10-CM

## 2013-06-28 DIAGNOSIS — H8110 Benign paroxysmal vertigo, unspecified ear: Secondary | ICD-10-CM

## 2013-06-28 DIAGNOSIS — IMO0001 Reserved for inherently not codable concepts without codable children: Secondary | ICD-10-CM

## 2013-06-28 DIAGNOSIS — E1165 Type 2 diabetes mellitus with hyperglycemia: Secondary | ICD-10-CM

## 2013-06-28 MED ORDER — MECLIZINE HCL 25 MG PO TABS: 25.0000 mg | ORAL_TABLET | Freq: Three times a day (TID) | ORAL | Status: DC | PRN

## 2013-06-28 MED ORDER — LISINOPRIL-HYDROCHLOROTHIAZIDE 20-25 MG PO TABS: 1.0000 | ORAL_TABLET | Freq: Every day | ORAL | Status: DC

## 2013-06-28 MED ORDER — AMLODIPINE BESYLATE 5 MG PO TABS: 5.0000 mg | ORAL_TABLET | Freq: Every day | ORAL | Status: DC

## 2013-06-28 NOTE — Patient Instructions (Addendum)
Take the pills for dizziness one every 6 hours if needed. They may make you drowsy.  Continue to keep an eye on your blood pressure and be faithful taking her medicine.  Monitor your glucose well. Watch your diet and continue taking her medicines to keep your sugar in good control  In the event of worse chest pains go to the emergency room or call 911 or return here if needed   Benign Positional Vertigo Vertigo means you feel like you or your surroundings are moving when they are not. Benign positional vertigo is the most common form of vertigo. Benign means that the cause of your condition is not serious. Benign positional vertigo is more common in older adults. CAUSES  Benign positional vertigo is the result of an upset in the labyrinth system. This is an area in the middle ear that helps control your balance. This may be caused by a viral infection, head injury, or repetitive motion. However, often no specific cause is found. SYMPTOMS  Symptoms of benign positional vertigo occur when you move your head or eyes in different directions. Some of the symptoms may include:  Loss of balance and falls.  Vomiting.  Blurred vision.  Dizziness.  Nausea.  Involuntary eye movements (nystagmus). DIAGNOSIS  Benign positional vertigo is usually diagnosed by physical exam. If the specific cause of your benign positional vertigo is unknown, your caregiver may perform imaging tests, such as magnetic resonance imaging (MRI) or computed tomography (CT). TREATMENT  Your caregiver may recommend movements or procedures to correct the benign positional vertigo. Medicines such as meclizine, benzodiazepines, and medicines for nausea may be used to treat your symptoms. In rare cases, if your symptoms are caused by certain conditions that affect the inner ear, you may need surgery. HOME CARE INSTRUCTIONS   Follow your caregiver's instructions.  Move slowly. Do not make sudden body or head  movements.  Avoid driving.  Avoid operating heavy machinery.  Avoid performing any tasks that would be dangerous to you or others during a vertigo episode.  Drink enough fluids to keep your urine clear or pale yellow. SEEK IMMEDIATE MEDICAL CARE IF:   You develop problems with walking, weakness, numbness, or using your arms, hands, or legs.  You have difficulty speaking.  You develop severe headaches.  Your nausea or vomiting continues or gets worse.  You develop visual changes.  Your family or friends notice any behavioral changes.  Your condition gets worse.  You have a fever.  You develop a stiff neck or sensitivity to light. MAKE SURE YOU:   Understand these instructions.  Will watch your condition.  Will get help right away if you are not doing well or get worse. Document Released: 01/17/2006 Document Revised: 07/04/2011 Document Reviewed: 12/30/2010 [facility] Patient Information 2014 [employer].

## 2013-06-28 NOTE — Progress Notes (Signed)
Subjective: 43 year old lady who has a history of diabetes, hypertension. She is usually kept her diabetes under fair control, with her last A1c about 7.2. She has a history of high blood pressure, and the current reading is actually fairly good for her. 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 The rest rest of the day she had problems when she would put head down and lifted up she would get a room spinning dizziness. She felt nauseous but no true vomiting. She felt a little tightness sensation in her chest. She went home and is felt better though her hasn't felt quite right today with some minimal dizziness. She has had an episode of this abdominal in the past, but this is worse she has had. She is on a new insurance which only pays $75. She would prefer not to get a lot of testing done today. She is scheduled to see her primary care doctor in 3 weeks. She was out of her amlodipine this week and ran out of the lisinopril hydrochlorothiazide yesterday and needs refills.  Objective: Pleasant lady in no major distress this morning. She is overweight. Her TMs are normal. Eyes PERRLA. EOMs intact. No nystagmus. Throat clear. Neck supple without nodes or thyromegaly. No carotid bruits. Chest clear to auscultation. Heart regular without murmurs. Cranial nerves grossly intact. Romberg negative. Tandem walk good. She has symmetrical use of all of her extremities. She has trace symmetrical reflexes.  Assessment: Positional vertigo Diabetes Hypertension, running out of medications for the amlodipine earlier in the week could have helped trigger this.  Plan: Antivert. If she has any worse symptoms or chest pains she is to return or go to the emergency room or go see her primary care doctor. We agreed not to do lab testing at this time, but she will return if having further problems.

## 2013-07-18 ENCOUNTER — Ambulatory Visit: Payer: BC Managed Care – PPO | Admitting: Emergency Medicine

## 2013-09-17 ENCOUNTER — Other Ambulatory Visit: Payer: Self-pay | Admitting: Family Medicine

## 2013-10-03 ENCOUNTER — Telehealth: Payer: Self-pay | Admitting: Emergency Medicine

## 2013-10-03 NOTE — Telephone Encounter (Signed)
Forward to PCP for refill request, she was told last time she would need a visit for refills.[place], Kevin Fenton

## 2013-10-03 NOTE — Telephone Encounter (Signed)
Pt needs refill on lisionpril. She knows she needs to see the dr but her insurance isnt effective until July 7. Please advise

## 2013-10-04 MED ORDER — LISINOPRIL-HYDROCHLOROTHIAZIDE 20-25 MG PO TABS: ORAL_TABLET | ORAL | Status: DC

## 2013-10-04 NOTE — Telephone Encounter (Signed)
Called and informed patient that her medication was sent to her pharmacy and that she needs to make an office visit before her next refill.[place], Kevin Fenton

## 2013-10-04 NOTE — Telephone Encounter (Signed)
I have sent in a 30 day supply of her lisinopril-hctz.  She should schedule an appointment for as soon as possible once she gets her insurance.

## 2013-11-15 ENCOUNTER — Ambulatory Visit: Payer: BC Managed Care – PPO | Admitting: Family Medicine

## 2013-11-19 ENCOUNTER — Encounter: Payer: Self-pay | Admitting: Family Medicine

## 2013-11-19 ENCOUNTER — Ambulatory Visit (INDEPENDENT_AMBULATORY_CARE_PROVIDER_SITE_OTHER): Payer: BC Managed Care – PPO | Admitting: Family Medicine

## 2013-11-19 VITALS — BP 158/108 | HR 90 | Temp 98.2°F | Wt 236.0 lb

## 2013-11-19 DIAGNOSIS — R21 Rash and other nonspecific skin eruption: Secondary | ICD-10-CM | POA: Diagnosis not present

## 2013-11-19 DIAGNOSIS — E118 Type 2 diabetes mellitus with unspecified complications: Secondary | ICD-10-CM

## 2013-11-19 DIAGNOSIS — I10 Essential (primary) hypertension: Secondary | ICD-10-CM | POA: Diagnosis not present

## 2013-11-19 LAB — POCT GLYCOSYLATED HEMOGLOBIN (HGB A1C): HEMOGLOBIN A1C: 8.1

## 2013-11-19 MED ORDER — HYDROCORTISONE 2.5 % EX CREA: TOPICAL_CREAM | Freq: Two times a day (BID) | CUTANEOUS | Status: DC

## 2013-11-19 MED ORDER — RANITIDINE HCL 150 MG PO TABS: 150.0000 mg | ORAL_TABLET | Freq: Two times a day (BID) | ORAL | Status: DC

## 2013-11-19 MED ORDER — METFORMIN HCL ER 500 MG PO TB24: 1000.0000 mg | ORAL_TABLET | Freq: Every day | ORAL | Status: DC

## 2013-11-19 MED ORDER — GLIMEPIRIDE 1 MG PO TABS: 1.0000 mg | ORAL_TABLET | Freq: Every day | ORAL | Status: DC

## 2013-11-19 MED ORDER — AMLODIPINE BESYLATE 10 MG PO TABS: 10.0000 mg | ORAL_TABLET | Freq: Every day | ORAL | Status: DC

## 2013-11-19 NOTE — Assessment & Plan Note (Signed)
BP elevated even on recheck. Will increase amlodipine to 10mg  daily. Pt to return for fasting labs, which will include CMET. F/u in 2 weeks for BP recheck.

## 2013-11-19 NOTE — Progress Notes (Signed)
Patient ID: [name], female   DOB: Jul 16, 1970, 43 y.o.   MRN: [id number]  HPI:  Pt presents today to f/u on her chronic medical conditions, also to discuss several new complaints. She has not been seen in our clinic in almost one year.  HTN - currently taking amlodipine 5mg  daily, lisinopril-HCTZ 20-25mg  daily. Denies any chest pain or shortness of breath (except occasional SOB when she smells something strong, which resolves when getting away from the smell). Does not check her BP at home.  DM - takes metformin XR 500mg  BID (states cannot take these two pills together or she feels sick). Previously was on glipizide but she did not like how this made her feel, had trouble with it getting stuck in her throat, also had trouble sleeping. Rarely checks CBG at home. Last eye exam was over 1 year ago.  Rash - has some spots on her legs she thinks are mosquito bites. They've been there for less than 1 week. She is getting more of them. Moved this weekend to a new house. The rash is itchy. Has not been outside a lot. No contacts that she knows have the rash. No new medicines or foods, no oral/genital sores, no fevers.  Note: pt also wanted to discuss chronic ear problems, back pain, and swelling in her feet. Advised she would need a separate visit to address these concerns as we did not have time to discuss them all today. Pt agreeable to this plan.  ROS: See HPI.  [place]: obesity, HTN, CKD stage 2, GERD, T2DM  PHYSICAL EXAM: BP 158/108  Pulse 90  Temp(Src) 98.2 F (36.8 C) (Oral)  Wt 236 lb (107.049 kg) BP remains elevated above 140/90 on repeat Gen: NAD HEENT: NCAT Heart: RRR, no murmurs Lungs: CTAB, NWOB Neuro: grossly nonfocal, speech intact Ext: no significant edema over bilateral legs. Several red papules over legs consistent with insect bites. No signs of superinfection. 2+DP pulses bilaterally. No sores or lesions on feet. Normal monofilament testing over bilateral plantar aspect of  feet.  ASSESSMENT/PLAN:  Rash Suspect bug bites (unclear if mosquito vs bed bugs). No red flags. Rx hydrocortisone cream for prn use for itching. Return if worsening or not improving.  HYPERTENSION, BENIGN SYSTEMIC BP elevated even on recheck. Will increase amlodipine to 10mg  daily. Pt to return for fasting labs, which will include CMET. F/u in 2 weeks for BP recheck.  Diabetes mellitus A1c up at 8.1 today. Only on metformin, didn't like glipizide. Will switch to glimepiride, start 1mg  daily (low dose to start and titrate up pending tolerability).  Cardiac: check lipids at fasting lab visit, likely needs statin but will await lipids Renal: on ACE, check CMET at lab visit Eye: advised pt to schedule eye exam with eye doctor Foot: normal monofilament exam today    FOLLOW UP: F/u in 2 weeks for recheck BP, also to discuss other concerns pt identified today.  [place]. Ardelia Mems, [place]

## 2013-11-19 NOTE — Patient Instructions (Addendum)
It was nice to meet you today!  For blood pressure: -your BP is elevated today. -increase amlodipine to 10mg  daily. I sent in a new prescription for you.  For diabetes: -start taking glimepiride once daily. I sent this in. -schedule appointment with your eye doctor.  For rash:  -try applying hydrocortisone cream twice a day as needed. I sent in prescription. -return if worsening or not getting better.  On your way out, schedule an appointment one morning to come back for fasting labs. Do not eat or drink anything other than water the morning of your lab appointment until after your labs are drawn.   Come back in a couple weeks to recheck your blood pressure and discuss the other concerns you brought up today.  Be well, Dr. Ardelia Mems

## 2013-11-19 NOTE — Assessment & Plan Note (Signed)
A1c up at 8.1 today. Only on metformin, didn't like glipizide. Will switch to glimepiride, start 1mg  daily (low dose to start and titrate up pending tolerability).  Cardiac: check lipids at fasting lab visit, likely needs statin but will await lipids Renal: on ACE, check CMET at lab visit Eye: advised pt to schedule eye exam with eye doctor Foot: normal monofilament exam today

## 2013-11-29 ENCOUNTER — Encounter: Payer: Self-pay | Admitting: Surgery

## 2013-12-02 ENCOUNTER — Other Ambulatory Visit: Payer: Self-pay | Admitting: *Deleted

## 2013-12-02 ENCOUNTER — Encounter: Payer: Self-pay | Admitting: Surgery

## 2013-12-02 ENCOUNTER — Ambulatory Visit (HOSPITAL_COMMUNITY)
Admission: RE | Admit: 2013-12-02 | Discharge: 2013-12-02 | Disposition: A | Discharge status: Home / Self Care | Payer: BC Managed Care – PPO | Source: Ambulatory Visit | Attending: Surgery | Admitting: Surgery

## 2013-12-02 ENCOUNTER — Ambulatory Visit (INDEPENDENT_AMBULATORY_CARE_PROVIDER_SITE_OTHER): Payer: BC Managed Care – PPO | Admitting: Surgery

## 2013-12-02 VITALS — BP 139/100 | HR 75 | Ht 63.0 in | Wt 237.0 lb

## 2013-12-02 DIAGNOSIS — I739 Peripheral vascular disease, unspecified: Secondary | ICD-10-CM

## 2013-12-02 DIAGNOSIS — Z48812 Encounter for surgical aftercare following surgery on the circulatory system: Secondary | ICD-10-CM | POA: Insufficient documentation

## 2013-12-02 DIAGNOSIS — I872 Venous insufficiency (chronic) (peripheral): Secondary | ICD-10-CM | POA: Insufficient documentation

## 2013-12-02 DIAGNOSIS — M7989 Other specified soft tissue disorders: Secondary | ICD-10-CM

## 2013-12-02 DIAGNOSIS — I871 Compression of vein: Secondary | ICD-10-CM

## 2013-12-02 NOTE — Addendum Note (Signed)
Addended by: Mena Goes on: 12/02/2013 11:36 AM   Modules accepted: Orders

## 2013-12-02 NOTE — Progress Notes (Signed)
Patient name: [name] MRN: [id number] DOB: 05-17-1970 Sex: female     Chief Complaint  Patient presents with  . Re-evaluation    6 month f/u     HISTORY OF PRESENT ILLNESS: The patient comes back today for followup of her left external iliac vein stents. This was placed in December of 2008. It was a Cordis 12 x 60 self-expanding stent. It was placed for tightness and swelling in her left leg. She had a CT scan which showed compression of her left external iliac vein from lymph nodes. 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  Currents and underwent angioplasty of her left external iliac vein 11/07/2012.  She has done well since that time.  She does report that her left leg has never felt the same as the right.  This predates her stent.  She denies any swelling currently.   Past Medical History  Diagnosis Date  . Hypertension   . Allergy     seasonal  . Obesity   . Hemorrhoids   . Diabetes mellitus   . Abnormal Pap smear   . Plantar fasciitis   . Atrial fibrillation     Past Surgical History  Procedure Laterality Date  . Abdominal hysterectomy  2005    for Fibroids  . Iliac vein angioplasty / stenting    . Cesarean section    . Tubal ligation      History   Social History  . Marital Status: Single    Spouse Name: N/A    Number of Children: N/A  . Years of Education: N/A   Occupational History  . Not on file.   Social History Main Topics  . Smoking status: Never Smoker   . Smokeless tobacco: Never Used  . Alcohol Use: No  . Drug Use: No  . Sexual Activity: Not on file   Other Topics Concern  . Not on file   Social History Narrative  . No narrative on file    Family History  Problem Relation Age of Onset  . Diabetes Mother   . Hypertension Mother   . Hyperlipidemia Mother   . Heart disease Sister     [employer]  . Heart attack Neg Hx   . Sudden death Neg Hx   . Hypertension Other     Allergies as of  12/02/2013 - Review Complete 12/02/2013  Allergen Reaction Noted  . Other  11/07/2012    Current Outpatient Prescriptions on File Prior to Visit  Medication Sig Dispense Refill  . amLODipine (NORVASC) 10 MG tablet Take 1 tablet (10 mg total) by mouth daily.  30 tablet  3  . aspirin 81 MG EC tablet Take 81 mg by mouth daily.       Marland Kitchen glimepiride (AMARYL) 1 MG tablet Take 1 tablet (1 mg total) by mouth daily with breakfast.  30 tablet  3  . hydrocortisone 2.5 % cream Apply topically 2 (two) times daily. To bug bites  30 g  0  . lisinopril-hydrochlorothiazide (PRINZIDE,ZESTORETIC) 20-25 MG per tablet take 1 tablet by mouth once daily  30 tablet  0  . meclizine (ANTIVERT) 25 MG tablet Take 1 tablet (25 mg total) by mouth 3 (three) times daily as needed for dizziness.  30 tablet  0  . metFORMIN (GLUCOPHAGE-XR) 500 MG 24 hr tablet Take 2 tablets (1,000 mg total) by mouth daily with breakfast.  60 tablet  2  . ranitidine (ZANTAC) 150 MG tablet  Take 1 tablet (150 mg total) by mouth 2 (two) times daily.  60 tablet  2  . fluticasone (FLONASE) 50 MCG/ACT nasal spray Place 2 sprays into the nose daily.  16 g  2  . glucose blood (ONE TOUCH ULTRA TEST) test strip 1 each by Other route daily as needed for other (symptoms of hypo- or hyperglycemia). Use as instructed  100 each  12  . Glucose Blood KIT Test blood sugar daily- fasting Dx 250.00  1 kit  0  . Lancet Devices (AUTO-LANCETS) MISC Test blood sugar as directed  Dx 250.00  100 each  12   No current facility-administered medications on file prior to visit.     REVIEW OF SYSTEMS: Cardiovascular: No chest pain, chest pressure, palpitations, orthopnea, or dyspnea on exertion. No claudication or rest pain,  Pulmonary: No productive cough, asthma or wheezing. Neurologic: No weakness, paresthesias, aphasia, or amaurosis. No dizziness. Hematologic: No bleeding problems or clotting disorders. Musculoskeletal: No joint pain or joint  swelling. Gastrointestinal: No blood in stool or hematemesis Genitourinary: No dysuria or hematuria. Psychiatric:: No history of major depression. Integumentary: No rashes or ulcers. Constitutional: No fever or chills.  PHYSICAL EXAMINATION:   Vital signs are BP 139/100  Pulse 75  Ht '5\' 3"'  (1.6 m)  Wt 237 lb (107.502 kg)  BMI 41.99 kg/m2  SpO2 100% General: The patient appears their stated age. HEENT:  No gross abnormalities Pulmonary:  Non labored breathing Musculoskeletal: There are no major deformities. Neurologic: No focal weakness or paresthesias are detected, Skin: There are no ulcer or rashes noted. Psychiatric: The patient has normal affect. Cardiovascular: There is a regular rate and rhythm without significant murmur appreciated.  No edema bilaterally   Diagnostic Studies I have ordered and reviewed her ultrasound.  This was a technically difficult study secondary to body habitus and bowel gas.  The stent does appear to be patent.  Assessment: Status post stent, left external iliac vein Plan: The patient is doing very well.  Her stent by ultrasound appears to be widely patent.  She denies any swelling.  She will need to be followed with serial ultrasound, the next one will be in a year and I did discuss that if she developed swelling to contact me sooner.  Eldridge Abrahams, M.D. [facility] Office: [phone] Pager:  [phone]

## 2013-12-03 ENCOUNTER — Other Ambulatory Visit: Payer: BC Managed Care – PPO

## 2013-12-03 DIAGNOSIS — E118 Type 2 diabetes mellitus with unspecified complications: Secondary | ICD-10-CM

## 2013-12-03 LAB — COMPREHENSIVE METABOLIC PANEL WITH GFR
ALT: 11 U/L (ref 0–35)
AST: 12 U/L (ref 0–37)
Albumin: 3.7 g/dL (ref 3.5–5.2)
Alkaline Phosphatase: 61 U/L (ref 39–117)
BUN: 11 mg/dL (ref 6–23)
CO2: 27 meq/L (ref 19–32)
Calcium: 9.2 mg/dL (ref 8.4–10.5)
Chloride: 101 meq/L (ref 96–112)
Creat: 1.1 mg/dL (ref 0.50–1.10)
Glucose, Bld: 134 mg/dL — ABNORMAL HIGH (ref 70–99)
Potassium: 3.7 meq/L (ref 3.5–5.3)
Sodium: 136 meq/L (ref 135–145)
Total Bilirubin: 0.3 mg/dL (ref 0.2–1.2)
Total Protein: 6.6 g/dL (ref 6.0–8.3)

## 2013-12-03 LAB — LIPID PANEL
CHOL/HDL RATIO: 4.6 ratio
Cholesterol: 144 mg/dL (ref 0–200)
HDL: 31 mg/dL — ABNORMAL LOW (ref >39)
LDL CALC: 98 mg/dL (ref 0–99)
Triglycerides: 74 mg/dL (ref <150)
VLDL: 15 mg/dL (ref 0–40)

## 2013-12-03 MED ORDER — LISINOPRIL-HYDROCHLOROTHIAZIDE 20-25 MG PO TABS: ORAL_TABLET | ORAL | Status: DC

## 2013-12-03 NOTE — Progress Notes (Signed)
CMP AND FLP DONE TODAY [name] 

## 2013-12-04 ENCOUNTER — Telehealth: Payer: Self-pay | Admitting: Family Medicine

## 2013-12-04 MED ORDER — ATORVASTATIN CALCIUM 40 MG PO TABS: 40.0000 mg | ORAL_TABLET | Freq: Every day | ORAL | Status: DC

## 2013-12-04 NOTE — Telephone Encounter (Signed)
[facility] red team, please call pt and let her know that based on her cholesterol numbers and other health risk factors, I recommend she take a cholesterol medicine daily. I have sent in a prescription for lipitor 40mg  daily. She also needs to schedule a f/u appointment with me to recheck her blood pressure.  Thanks! Leeanne Rio, MD

## 2013-12-04 NOTE — Telephone Encounter (Signed)
Called and informed patient about the cholesterol medication, she already has a follow up appointment scheduled on the 20th and is aware of that appointment. She is also requesting a refill on her lisinopril. [name], Kevin [place]

## 2013-12-04 NOTE — Telephone Encounter (Signed)
I sent in refills on her lisinopril yesterday. Leeanne Rio, MD

## 2013-12-12 ENCOUNTER — Ambulatory Visit: Payer: BC Managed Care – PPO | Admitting: Family Medicine

## 2013-12-24 ENCOUNTER — Ambulatory Visit (INDEPENDENT_AMBULATORY_CARE_PROVIDER_SITE_OTHER): Payer: BC Managed Care – PPO | Admitting: Family Medicine

## 2013-12-24 ENCOUNTER — Encounter: Payer: Self-pay | Admitting: Family Medicine

## 2013-12-24 VITALS — BP 113/84 | HR 99 | Temp 98.0°F | Wt 236.0 lb

## 2013-12-24 DIAGNOSIS — L909 Atrophic disorder of skin, unspecified: Secondary | ICD-10-CM | POA: Diagnosis not present

## 2013-12-24 DIAGNOSIS — I10 Essential (primary) hypertension: Secondary | ICD-10-CM | POA: Diagnosis not present

## 2013-12-24 DIAGNOSIS — Q825 Congenital non-neoplastic nevus: Secondary | ICD-10-CM | POA: Diagnosis not present

## 2013-12-24 DIAGNOSIS — L918 Other hypertrophic disorders of the skin: Secondary | ICD-10-CM

## 2013-12-24 DIAGNOSIS — K219 Gastro-esophageal reflux disease without esophagitis: Secondary | ICD-10-CM

## 2013-12-24 DIAGNOSIS — M549 Dorsalgia, unspecified: Secondary | ICD-10-CM

## 2013-12-24 DIAGNOSIS — L919 Hypertrophic disorder of the skin, unspecified: Secondary | ICD-10-CM | POA: Diagnosis not present

## 2013-12-24 DIAGNOSIS — G8929 Other chronic pain: Secondary | ICD-10-CM

## 2013-12-24 MED ORDER — LISINOPRIL-HYDROCHLOROTHIAZIDE 20-25 MG PO TABS: ORAL_TABLET | ORAL | Status: DC

## 2013-12-24 MED ORDER — FLUTICASONE PROPIONATE 50 MCG/ACT NA SUSP: 2.0000 | Freq: Every day | NASAL | Status: DC

## 2013-12-24 MED ORDER — OMEPRAZOLE 20 MG PO CPDR: 20.0000 mg | DELAYED_RELEASE_CAPSULE | Freq: Every day | ORAL | Status: DC

## 2013-12-24 MED ORDER — CYCLOBENZAPRINE HCL 5 MG PO TABS: 5.0000 mg | ORAL_TABLET | Freq: Every day | ORAL | Status: DC | PRN

## 2013-12-24 NOTE — Patient Instructions (Addendum)
It was great to see you again today!  Blood pressure: -BP is great today. Continue current medicines.  Back pain: -use flexeril daily as needed for flare ups. Do not take every day. It may make you sleepy so use caution -see exercises below  Flonase refill: I sent this in.  Spot on arm: -schedule separate procedure visit to have this removed  Cough: -take omeprazole for acid reflux, let's see if this helps -follow up in 1 month to re-evaluate  Be well, Dr. Ardelia Mems    Back Exercises Back exercises help treat and prevent back injuries. The goal of back exercises is to increase the strength of your abdominal and back muscles and the flexibility of your back. These exercises should be started when you no longer have back pain. Back exercises include:  Pelvic Tilt. Lie on your back with your knees bent. Tilt your pelvis until the lower part of your back is against the floor. Hold this position 5 to 10 sec and repeat 5 to 10 times.  Knee to Chest. Pull first 1 knee up against your chest and hold for 20 to 30 seconds, repeat this with the other knee, and then both knees. This may be done with the other leg straight or bent, whichever feels better.  Sit-Ups or Curl-Ups. Bend your knees 90 degrees. Start with tilting your pelvis, and do a partial, slow sit-up, lifting your trunk only 30 to 45 degrees off the floor. Take at least 2 to 3 seconds for each sit-up. Do not do sit-ups with your knees out straight. If partial sit-ups are difficult, simply do the above but with only tightening your abdominal muscles and holding it as directed.  Hip-Lift. Lie on your back with your knees flexed 90 degrees. Push down with your feet and shoulders as you raise your hips a couple inches off the floor; hold for 10 seconds, repeat 5 to 10 times.  Back arches. Lie on your stomach, propping yourself up on bent elbows. Slowly press on your hands, causing an arch in your low back. Repeat 3 to 5 times. Any  initial stiffness and discomfort should lessen with repetition over time.  Shoulder-Lifts. Lie face down with arms beside your body. Keep hips and torso pressed to floor as you slowly lift your head and shoulders off the floor. Do not overdo your exercises, especially in the beginning. Exercises may cause you some mild back discomfort which lasts for a few minutes; however, if the pain is more severe, or lasts for more than 15 minutes, do not continue exercises until you see your caregiver. Improvement with exercise therapy for back problems is slow.  See your caregivers for assistance with developing a proper back exercise program. Document Released: 05/19/2004 Document Revised: 07/04/2011 Document Reviewed: 02/10/2011 [facility] Patient Information 2015 [place], [place]. This information is not intended to replace advice given to you by your health care provider. Make sure you discuss any questions you have with your health care provider.

## 2013-12-30 DIAGNOSIS — Q825 Congenital non-neoplastic nevus: Secondary | ICD-10-CM | POA: Insufficient documentation

## 2013-12-30 DIAGNOSIS — G8929 Other chronic pain: Secondary | ICD-10-CM | POA: Insufficient documentation

## 2013-12-30 DIAGNOSIS — L918 Other hypertrophic disorders of the skin: Secondary | ICD-10-CM | POA: Insufficient documentation

## 2013-12-30 DIAGNOSIS — M549 Dorsalgia, unspecified: Secondary | ICD-10-CM

## 2013-12-30 NOTE — Progress Notes (Signed)
Patient ID: [name], female   DOB: 09-Apr-1971, 43 y.o.   MRN: [id number]  HPI:  HTN: at last visit we increased amlodipine to 10mg  daily. She also takes lisinopril-HCTZ 20-25mg  daily. Denies any chest pain, SOB, or bothersome lower extremity edema. Tolerating amlodipine increased dose well.  Back pain: has chronic R lower back pain. No problems with bowel or bladder function. No crotch numbness, leg weakness, or numbness/tingling in legs.  Spot on arm: has had pedunculated nodule on R anterior upper arm for 19 years. Family members have similar skin lesions. It is getting bigger. Never gets red, painful, or swollen. Wants it removed.  Cough: has had cough every other day for the last 3-4 days. On and off. Occasionally produces phlegm. Wakes up at night with cough frequently. No fevers, weight loss, night sweats. Has been on lisinopril for a long time.  ROS: See HPI.  [place]: hx obesity, HTN, GERD, DM, CKD stage 2  PHYSICAL EXAM: BP 113/84  Pulse 99  Temp(Src) 98 F (36.7 C) (Oral)  Wt 236 lb (107.049 kg) Gen: NAD HEENT: NCAT Heart: RRR, no murmurs Lungs: CTAB NWOB Neuro: grossly nonfocal speech normal Abd: soft, NTTP Ext: No appreciable lower extremity edema bilaterally, full strength bilat lower ext Back: R paraspinous muscles TTP with mild spasm Skin: pedunculated soft nodule of R anterior upper arm which is the size of a large marble present. Somewhat irregular. Appearance consistent with possible keloid or skin tag. Large hyperpigmented birth mark to R lateral shin > 10 cm in diameter.  ASSESSMENT/PLAN:  Birthmark of skin Refer to derm for evaluation given how large the birthmark is, and for routine monitoring for any neoplastic changes.  Skin tag Relatively large skin tag of R anterior upper arm. Recommended to pt that we can remove this in a separate office visit. Will have her schedule procedure visit to have this removed. Will plan to send to path to confirm  diagnosis.  HYPERTENSION, BENIGN SYSTEMIC BP improved with increased dose of amlodipine. Continue current regimen.  GASTROESOPHAGEAL REFLUX, NO ESOPHAGITIS Pt c/o cough at night. Hx of GERD. Will rx PPI 1 month trial to evaluate for improvement. F/u in 1 mo.  Chronic back pain No red flags. MSK in nature. Rx flexeril for daily prn use. Given handout with exercises to improve muscle strength and flexibility of back.   FOLLOW UP: F/u in 1 month for cough. Schedule separate procedure visit to have skin tag removed Referring to derm for birth mark.  [place]. Ardelia Mems, [place]

## 2013-12-30 NOTE — Assessment & Plan Note (Signed)
No red flags. MSK in nature. Rx flexeril for daily prn use. Given handout with exercises to improve muscle strength and flexibility of back.

## 2013-12-30 NOTE — Assessment & Plan Note (Signed)
Relatively large skin tag of R anterior upper arm. Recommended to pt that we can remove this in a separate office visit. Will have her schedule procedure visit to have this removed. Will plan to send to path to confirm diagnosis.

## 2013-12-30 NOTE — Assessment & Plan Note (Signed)
Pt c/o cough at night. Hx of GERD. Will rx PPI 1 month trial to evaluate for improvement. F/u in 1 mo.

## 2013-12-30 NOTE — Assessment & Plan Note (Signed)
BP improved with increased dose of amlodipine. Continue current regimen.

## 2013-12-30 NOTE — Assessment & Plan Note (Signed)
Refer to derm for evaluation given how large the birthmark is, and for routine monitoring for any neoplastic changes.

## 2014-01-09 ENCOUNTER — Ambulatory Visit: Payer: BC Managed Care – PPO | Admitting: Family Medicine

## 2014-01-17 ENCOUNTER — Emergency Department (HOSPITAL_COMMUNITY)
Admission: EM | Admit: 2014-01-17 | Discharge: 2014-01-17 | Disposition: A | Discharge status: Home / Self Care | Payer: BC Managed Care – PPO | Attending: Emergency Medicine | Admitting: Emergency Medicine

## 2014-01-17 ENCOUNTER — Encounter (HOSPITAL_COMMUNITY): Payer: Self-pay | Admitting: Emergency Medicine

## 2014-01-17 ENCOUNTER — Encounter: Payer: Self-pay | Admitting: Family Medicine

## 2014-01-17 ENCOUNTER — Ambulatory Visit (INDEPENDENT_AMBULATORY_CARE_PROVIDER_SITE_OTHER): Payer: BC Managed Care – PPO | Admitting: Family Medicine

## 2014-01-17 VITALS — BP 137/96 | HR 75 | Temp 98.4°F | Ht 63.0 in | Wt 240.9 lb

## 2014-01-17 DIAGNOSIS — L909 Atrophic disorder of skin, unspecified: Secondary | ICD-10-CM

## 2014-01-17 DIAGNOSIS — I10 Essential (primary) hypertension: Secondary | ICD-10-CM | POA: Insufficient documentation

## 2014-01-17 DIAGNOSIS — Z4801 Encounter for change or removal of surgical wound dressing: Secondary | ICD-10-CM | POA: Insufficient documentation

## 2014-01-17 DIAGNOSIS — Z7982 Long term (current) use of aspirin: Secondary | ICD-10-CM | POA: Diagnosis not present

## 2014-01-17 DIAGNOSIS — Z79899 Other long term (current) drug therapy: Secondary | ICD-10-CM | POA: Diagnosis not present

## 2014-01-17 DIAGNOSIS — E669 Obesity, unspecified: Secondary | ICD-10-CM | POA: Diagnosis not present

## 2014-01-17 DIAGNOSIS — D489 Neoplasm of uncertain behavior, unspecified: Secondary | ICD-10-CM | POA: Diagnosis not present

## 2014-01-17 DIAGNOSIS — L919 Hypertrophic disorder of the skin, unspecified: Secondary | ICD-10-CM

## 2014-01-17 DIAGNOSIS — IMO0002 Reserved for concepts with insufficient information to code with codable children: Secondary | ICD-10-CM | POA: Insufficient documentation

## 2014-01-17 DIAGNOSIS — E119 Type 2 diabetes mellitus without complications: Secondary | ICD-10-CM | POA: Insufficient documentation

## 2014-01-17 DIAGNOSIS — L918 Other hypertrophic disorders of the skin: Secondary | ICD-10-CM

## 2014-01-17 DIAGNOSIS — Z8739 Personal history of other diseases of the musculoskeletal system and connective tissue: Secondary | ICD-10-CM | POA: Insufficient documentation

## 2014-01-17 DIAGNOSIS — Z5189 Encounter for other specified aftercare: Secondary | ICD-10-CM

## 2014-01-17 DIAGNOSIS — I4891 Unspecified atrial fibrillation: Secondary | ICD-10-CM | POA: Insufficient documentation

## 2014-01-17 HISTORY — PX: SKIN TAG REMOVAL: SHX780

## 2014-01-17 NOTE — ED Provider Notes (Signed)
Medical screening examination/treatment/procedure(s) were performed by non-physician practitioner and as supervising physician I was immediately available for consultation/collaboration.  Richarda Blade, MD 01/17/14 [phone]

## 2014-01-17 NOTE — ED Notes (Signed)
Pt was seen at [facility] this am around 0900 and had a skin excision to R upper arm. "Was fine throughout the day. Bleeding started at ~ 2035". Denies pain. Takes daily ASA, did not have ASA today.

## 2014-01-17 NOTE — Discharge Instructions (Signed)
Biopsy Care After Refer to this sheet in the next few weeks. These instructions provide you with information on caring for yourself after your procedure. Your caregiver may also give you more specific instructions. Your treatment has been planned according to current medical practices, but problems sometimes occur. Call your caregiver if you have any problems or questions after your procedure. If you had a fine needle biopsy, you may have soreness at the biopsy site for 1 to 2 days. If you had an open biopsy, you may have soreness at the biopsy site for 3 to 4 days. HOME CARE INSTRUCTIONS   You may resume normal diet and activities as directed.  Change bandages (dressings) as directed. If your wound was closed with a skin glue (adhesive), it will wear off and begin to peel in 7 days.  Only take over-the-counter or prescription medicines for pain, discomfort, or fever as directed by your caregiver.  Ask your caregiver when you can bathe and get your wound wet. SEEK IMMEDIATE MEDICAL CARE IF:   You have increased bleeding (more than a small spot) from the biopsy site.  You notice redness, swelling, or increasing pain at the biopsy site.  You have pus coming from the biopsy site.  You have a fever.  You notice a bad smell coming from the biopsy site or dressing.  You have a rash, have difficulty breathing, or have any allergic problems. MAKE SURE YOU:   Understand these instructions.  Will watch your condition.  Will get help right away if you are not doing well or get worse. Document Released: 10/29/2004 Document Revised: 07/04/2011 Document Reviewed: 10/07/2010 [employer] Patient Information 2015 [employer]. This information is not intended to replace advice given to you by your health care provider. Make sure you discuss any questions you have with your health care provider.  

## 2014-01-17 NOTE — Patient Instructions (Signed)
Keep the incision clean and dry. Do not shower for the next 48 hours. Return in 5-7 days for a nurse visit to have your stitches removed. Call with any questions or concerns.  Be well, Dr. Cletis Media INSTRUCTIONS   If the biopsied area was the arm or leg, keep it raised (above the level of your heart) to decrease pain and swelling, if you are having any.  Keep the wound and dressing clean and dry. Clean as necessary.  If the dressing gets wet, remove it slowly and carefully. If it sticks, use warm, soapy water to gently loosen it. Pat the area dry with a clean towel before putting on another dressing.  Return in 5-7 days or as directed to have your sutures (stitches) removed.  Call in 3 to 4 days, or as directed, for the results of your biopsy. SEEK IMMEDIATE MEDICAL CARE IF:   You have a fever.  You have excess blood soaking through the dressing.  You have increasing pain and swelling in the wound.  You have numbness or swelling below the wound.  You have redness, swelling, pus, a bad smell, or red streaks coming away from the wound, or any other signs of infection. MAKE SURE YOU:   Understand these instructions.  Will watch your condition.  Will get help right away if you are not doing well or get worse.

## 2014-01-17 NOTE — ED Provider Notes (Signed)
CSN: [id number]     Arrival date & time 01/17/14  2138 History  This chart was scribed for non-physician practitioner working with Richarda Blade, MD by Mercy Moore, ED Scribe. This patient was seen in room TR08C/TR08C and the patient's care was started at 10:42 PM.   Chief Complaint  Patient presents with  . Wound Check   The history is provided by the patient. No language interpreter was used.   HPI Comments: [name] is a 43 y.o. female who presents to the Emergency Department with chief complaint of bleeding from an incision site to proximal, medial aspect of right arm from procedure performed earlier today. No fever.  No pain.  Has not tried anything to alleviate the symptoms. Patient states that she has a mild allergy to the tape/dressing used to cover her suture site.   Past Medical History  Diagnosis Date  . Hypertension   . Allergy     seasonal  . Obesity   . Hemorrhoids   . Diabetes mellitus   . Abnormal Pap smear   . Plantar fasciitis   . Atrial fibrillation    Past Surgical History  Procedure Laterality Date  . Abdominal hysterectomy  2005    for Fibroids  . Iliac vein angioplasty / stenting    . Cesarean section    . Tubal ligation     Family History  Problem Relation Age of Onset  . Diabetes Mother   . Hypertension Mother   . Hyperlipidemia Mother   . Heart disease Sister     [employer]  . Heart attack Neg Hx   . Sudden death Neg Hx   . Hypertension Other    History  Substance Use Topics  . Smoking status: Never Smoker   . Smokeless tobacco: Never Used  . Alcohol Use: No   OB History   Grav Para Term Preterm Abortions TAB SAB Ect Mult Living                 Review of Systems  Constitutional: Negative for fever and chills.  Skin: Positive for wound.   Allergies  Other  Home Medications   Prior to Admission medications   Medication Sig Start Date End Date Taking? Authorizing Provider  amLODipine (NORVASC) 10 MG tablet Take  1 tablet (10 mg total) by mouth daily. 11/19/13   Leeanne Rio, MD  aspirin 81 MG EC tablet Take 81 mg by mouth daily.     Historical Provider, MD  atorvastatin (LIPITOR) 40 MG tablet Take 1 tablet (40 mg total) by mouth daily. 12/04/13   Leeanne Rio, MD  cyclobenzaprine (FLEXERIL) 5 MG tablet Take 1 tablet (5 mg total) by mouth daily as needed for muscle spasms. 12/24/13   Leeanne Rio, MD  fluticasone (FLONASE) 50 MCG/ACT nasal spray Place 2 sprays into both nostrils daily. 12/24/13 05/23/16  Leeanne Rio, MD  glimepiride (AMARYL) 1 MG tablet Take 1 tablet (1 mg total) by mouth daily with breakfast. 11/19/13   Leeanne Rio, MD  glucose blood (ONE TOUCH ULTRA TEST) test strip 1 each by Other route daily as needed for other (symptoms of hypo- or hyperglycemia). Use as instructed 08/09/12   Melony Overly, MD  Glucose Blood KIT Test blood sugar daily- fasting Dx 250.00 06/02/10   Alycia Rossetti, MD  hydrocortisone 2.5 % cream Apply topically 2 (two) times daily. To bug bites 11/19/13   Leeanne Rio, MD  Lancet Devices (  AUTO-LANCETS) MISC Test blood sugar as directed  Dx 250.00 06/02/10   Alycia Rossetti, MD  lisinopril-hydrochlorothiazide [facility]) 20-25 MG per tablet take 1 tablet by mouth once daily 12/24/13   Leeanne Rio, MD  meclizine (ANTIVERT) 25 MG tablet Take 1 tablet (25 mg total) by mouth 3 (three) times daily as needed for dizziness. 06/28/13   Posey Boyer, MD  metFORMIN (GLUCOPHAGE-XR) 500 MG 24 hr tablet Take 2 tablets (1,000 mg total) by mouth daily with breakfast. 11/19/13 11/19/14  Leeanne Rio, MD  omeprazole (PRILOSEC) 20 MG capsule Take 1 capsule (20 mg total) by mouth daily. 12/24/13   Leeanne Rio, MD  ranitidine (ZANTAC) 150 MG tablet Take 1 tablet (150 mg total) by mouth 2 (two) times daily. 11/19/13   Leeanne Rio, MD   Triage Vitals: BP 155/101  Pulse 94  Temp(Src) 98.4 F (36.9 C) (Oral)  Resp 18  SpO2  100%  Physical Exam  Nursing note and vitals reviewed. Constitutional: She is oriented to person, place, and time. She appears well-developed and well-nourished. No distress.  HENT:  Head: Normocephalic and atraumatic.  Eyes: EOM are normal.  Neck: Neck supple. No tracheal deviation present.  Cardiovascular: Normal rate.   Pulmonary/Chest: Effort normal. No respiratory distress.  Musculoskeletal: Normal range of motion.  Neurological: She is alert and oriented to person, place, and time.  Skin: Skin is warm and dry. There is erythema.  Right upper arm: remarkable for recently closed biopsy site. Mild oozing. No evidence of purulent discharge, fluctuance or fluid collection. No surrounding cellulitis.   Psychiatric: She has a normal mood and affect. Her behavior is normal.    ED Course  Procedures (including critical care time)  COORDINATION OF CARE: 10:47 PM- Plans to redress. Patient given return precautions. Discussed treatment plan with patient at bedside and patient agreed to plan.   Labs Review Labs Reviewed - No data to display  Imaging Review No results found.   EKG Interpretation None      MDM   Final diagnoses:  Visit for wound check   Patient here for wound check.  Noticed some bleeding from biopsy site today.  Biopsy was performed today.  No signs of infection.  Looks good.  DC to home.  I personally performed the services described in this documentation, which was scribed in my presence. The recorded information has been reviewed and is accurate.    Montine Circle, PA-C 01/17/14 2315

## 2014-01-19 NOTE — Assessment & Plan Note (Signed)
Removed today 9/25 during office visit. Will send to pathology.

## 2014-01-19 NOTE — Progress Notes (Addendum)
Patient ID: [name], female   DOB: [birth year], 43 y.o.   MRN: [id number]  PROCEDURE NOTE:  Pt presents today for removal of R proximal upper arm pedunculated skin lesion, measuring approximately 1.5cm x1cm at its base.  Patient given informed consent, signed copy in the chart. Appropriate time out taken. A surgical marking pen used to outline area of elliptical excision, 3.5cm in length x 1.1 cm across. Approximately 3 cc of 1%  lidocaine with epinephrine was used to obtain local anesthesia. Area prepped and draped in usual sterile fashion. A 15 blade was used to excise the skin lesion through the entire thickness of the skin. The lesion was hemostatic except for one small area, which was cauterized using silver nitrate. The skin was then closed with 7 interrupted sutures of 3-0 ethilon then covered with a gauze dressing. Amount of bleeding was less than 5 cc. The excised tissue was sent for pathology.There were no complications. Patient tolerated procedure well. The entire procedure was supervised by Dr. Ree Kida.  Patient was given handout explaining post-procedure care and concerning signs and symptoms to watch for such as fever, redness, unusual swelling, significant pain, drainage. Patient should return for followup in 5-7 days for suture removal. Pathology pending.  Chrisandra Netters, MD [place]

## 2014-01-20 ENCOUNTER — Ambulatory Visit (INDEPENDENT_AMBULATORY_CARE_PROVIDER_SITE_OTHER): Payer: BC Managed Care – PPO | Admitting: *Deleted

## 2014-01-20 DIAGNOSIS — Z5189 Encounter for other specified aftercare: Secondary | ICD-10-CM

## 2014-01-20 NOTE — Progress Notes (Signed)
   Pt in nurse clinic to assess a wound on upper/inner right arm.  Pt had stitches placed 01/17/2014.  The area is tender to touch and small amount of drainage noted.  Precepted with Dr. Ree Kida; assessed area, discussed with pt that the drainage is a normal amount and to return on 01/22/2014 for nurse visit to reassess and suture removal.  Large band aide placed on area.  Pt told to call or return to clinic if she develops a fever; increase swelling, redness or drainage at wound site.  Pt stated understanding.  Derl Barrow, RN

## 2014-01-22 ENCOUNTER — Ambulatory Visit (INDEPENDENT_AMBULATORY_CARE_PROVIDER_SITE_OTHER): Payer: BC Managed Care – PPO | Admitting: *Deleted

## 2014-01-22 ENCOUNTER — Other Ambulatory Visit: Payer: Self-pay | Admitting: Family Medicine

## 2014-01-22 DIAGNOSIS — Z4802 Encounter for removal of sutures: Secondary | ICD-10-CM

## 2014-01-22 MED ORDER — AMOXICILLIN-POT CLAVULANATE 875-125 MG PO TABS: 1.0000 | ORAL_TABLET | Freq: Two times a day (BID) | ORAL | Status: DC

## 2014-01-22 NOTE — Telephone Encounter (Signed)
Patient called regarding abx (Dr. Ree Kida called into pharmacy earlier but apparently was not ready yet?) Resent. [facility] [place], MD

## 2014-01-22 NOTE — Progress Notes (Signed)
   Pt in nurse clinic for suture removal.  Pt stated that wound is still draining a little bit and warm touch.  Precepted with Dr. Ree Kida; will send in antibotics and have pt return on Friday for suture removal.  Derl Barrow, RN

## 2014-01-23 NOTE — Progress Notes (Signed)
Patient ID: [name], female   DOB: 01-01-1971, 43 y.o.   MRN: [id number] Patient seen at request of nursing staff, she is 5 days out from removal of benign lesion of right upper arm, she is having small amount of drainage from the area.  Sutures in place, mostly closed/healing well except for upper aspect, small amount of serous drainage, mild erythema, no increased warmth  Plan - return for suture removal in 2 days, with small about of erythema and warmth will start course of antibiotics (favor normal healing process however with an open area along the suture line she is at increased risk of infection), prescription for Augmentin sent to pharmacy

## 2014-01-24 ENCOUNTER — Ambulatory Visit (INDEPENDENT_AMBULATORY_CARE_PROVIDER_SITE_OTHER): Payer: BC Managed Care – PPO | Admitting: *Deleted

## 2014-01-24 DIAGNOSIS — Z4802 Encounter for removal of sutures: Secondary | ICD-10-CM

## 2014-01-24 NOTE — Progress Notes (Signed)
   Pt in nurse clinic for suture removal of right upper inner arm.  Pt had 7 sutures removed.  There was yellow drainage noted; Dr. Ardelia Mems assessed area. Pt has an appt with PCP on Monday for follow-up of suture removal.  Three steri strips placed on area. Pt tolerated suture removal.  Pt advised to call clinic or go to urgent care if she develops fever, increase drainage; redness.  Pt stated understanding.  Pt also advised to continue antibiotics as prescribed.  Derl Barrow, RN

## 2014-01-27 ENCOUNTER — Ambulatory Visit (INDEPENDENT_AMBULATORY_CARE_PROVIDER_SITE_OTHER): Payer: BC Managed Care – PPO | Admitting: Family Medicine

## 2014-01-27 ENCOUNTER — Ambulatory Visit: Payer: BC Managed Care – PPO | Admitting: Family Medicine

## 2014-01-27 ENCOUNTER — Encounter: Payer: Self-pay | Admitting: Family Medicine

## 2014-01-27 VITALS — BP 153/106 | HR 87 | Temp 98.6°F | Ht 63.0 in | Wt 236.0 lb

## 2014-01-27 DIAGNOSIS — E119 Type 2 diabetes mellitus without complications: Secondary | ICD-10-CM | POA: Diagnosis not present

## 2014-01-27 DIAGNOSIS — K219 Gastro-esophageal reflux disease without esophagitis: Secondary | ICD-10-CM | POA: Diagnosis not present

## 2014-01-27 DIAGNOSIS — Z23 Encounter for immunization: Secondary | ICD-10-CM

## 2014-01-27 DIAGNOSIS — I10 Essential (primary) hypertension: Secondary | ICD-10-CM | POA: Diagnosis not present

## 2014-01-27 DIAGNOSIS — T8131XD Disruption of external operation (surgical) wound, not elsewhere classified, subsequent encounter: Secondary | ICD-10-CM

## 2014-01-27 LAB — POCT GLYCOSYLATED HEMOGLOBIN (HGB A1C): Hemoglobin A1C: 7

## 2014-01-27 MED ORDER — DOXYCYCLINE HYCLATE 100 MG PO TABS: 100.0000 mg | ORAL_TABLET | Freq: Two times a day (BID) | ORAL | Status: DC

## 2014-01-27 NOTE — Patient Instructions (Signed)
It was great to see you again today!  For wound: -start taking doxycycline 100mg  twice daily for 7 days -follow up with Tamika on Thursday for a wound check -keep steri strips in place  For blood pressure: -have Tamika recheck your BP on Thursday when you return  For diabetes: -schedule an appointment with your eye doctor -A1c is great today -continue current medicines  For cough: -continue omeprazole -I'm glad this is better  Follow up with me in 3 months or sooner if you have any problems.  Be well, Dr. Ardelia Mems

## 2014-01-27 NOTE — Progress Notes (Signed)
Patient ID: [name], female   DOB: 02-18-1971, 43 y.o.   MRN: [id number]   HPI:  F/u cough: Has had cough about 2-3 times since starting omeprazole, doing better, no heartburn feeling, on omeprazole 20mg  daily  Diabetes: currently taking glimepiride 1mg  daily, metformin XR 1000mg  daily. Last eye exam was last year. Needs to schedule appt. Has some callouses on her feet that bother her.   HTN: taking amlodipine 10mg  daily, lisinopril-HCTZ 20-25mg  daily. Took her meds earlier today. No CP or SOB.  Skin wound: has been on augmentin for dehiscence of her excisional biopsy on R upper arm. No fevers. Has had some drainage. Keeping steristrips and bandaids on the area.  ROS: See HPI.  [place]: hx DM, HTN, GERD, mild CKD stage 2  PHYSICAL EXAM: BP 153/106  Pulse 87  Temp(Src) 98.6 F (37 C) (Oral)  Ht 5\' 3"  (1.6 m)  Wt 236 lb (107.049 kg)  BMI 41.82 kg/m2  SpO2 98% Gen: NAD HEENT: NCAT Heart: RRR Lungs: CTAB Neuro: grossly nonfocal speech normal Skin: area of skin excision well healed at the inferior base, but at the superior aspect there is some purulent drainage. No surrounding erythema, induration, or warmth.  Diabetic Foot Check -  Appearance - a few callouses Skin - no unusual pallor or redness Monofilament testing - normal bilaterally Pulse - 2+ DP pulses bilat   ASSESSMENT/PLAN:  Diabetes mellitus A1c improved to 7.0 with addition of glimepiride. Continue current regimen.  Cardiac: on statin, aspirin Renal: on ACE Eye: advised pt to schedule appt with eye doctor Foot: normal monofilament exam today, but given callouses recommended looking at feet daily. Also recommend pedicures to help with callouses.   PNA shot given today.  HYPERTENSION, BENIGN SYSTEMIC BP elevated but this is likely due to stress of coming to doctor, having unexpected fingerstick, and stress of her arm wound. Will recheck BP when she returns for a nurse visit to reassess  wound.  GASTROESOPHAGEAL REFLUX, NO ESOPHAGITIS Cough improved with omeprazole. Continue this medication.  Postoperative dehiscence of skin wound Still some purulent drainage, although may just be granulation tissue. Will add doxycycline coverage. F/u in a few days for reassessment of wound with nurse. Precepted with Dr. Ree Kida who also examined patient and agrees with this plan.    FOLLOW UP: F/u in a few days for skin check & BP check.  [place]. Ardelia Mems, [place]

## 2014-01-29 ENCOUNTER — Ambulatory Visit (INDEPENDENT_AMBULATORY_CARE_PROVIDER_SITE_OTHER): Payer: BC Managed Care – PPO | Admitting: *Deleted

## 2014-01-29 VITALS — BP 153/100 | HR 92

## 2014-01-29 DIAGNOSIS — Z136 Encounter for screening for cardiovascular disorders: Secondary | ICD-10-CM

## 2014-01-29 DIAGNOSIS — Z5189 Encounter for other specified aftercare: Secondary | ICD-10-CM

## 2014-01-29 DIAGNOSIS — Z0130 Encounter for examination of blood pressure without abnormal findings: Secondary | ICD-10-CM

## 2014-01-29 NOTE — Progress Notes (Signed)
   Pt in nurse clinic for blood pressure check and wound check of right upper inner arm.  BP 153/100 manually, heart rate 92.  Pt denies any symptoms today.  Wound; no drainage; area is pink.  Pt denies any fever.  Pt to return in a week for recheck per PCP.  Wound redressed with steri-strips.  Will forward to PCP.  Derl Barrow, RN

## 2014-01-31 ENCOUNTER — Ambulatory Visit: Payer: BC Managed Care – PPO

## 2014-01-31 DIAGNOSIS — T8131XA Disruption of external operation (surgical) wound, not elsewhere classified, initial encounter: Secondary | ICD-10-CM | POA: Insufficient documentation

## 2014-01-31 NOTE — Assessment & Plan Note (Signed)
BP elevated but this is likely due to stress of coming to doctor, having unexpected fingerstick, and stress of her arm wound. Will recheck BP when she returns for a nurse visit to reassess wound.

## 2014-01-31 NOTE — Assessment & Plan Note (Signed)
Still some purulent drainage, although may just be granulation tissue. Will add doxycycline coverage. F/u in a few days for reassessment of wound with nurse. Precepted with Dr. Ree Kida who also examined patient and agrees with this plan.

## 2014-01-31 NOTE — Assessment & Plan Note (Signed)
Cough improved with omeprazole. Continue this medication.

## 2014-01-31 NOTE — Assessment & Plan Note (Addendum)
A1c improved to 7.0 with addition of glimepiride. Continue current regimen.  Cardiac: on statin, aspirin Renal: on ACE Eye: advised pt to schedule appt with eye doctor Foot: normal monofilament exam today, but given callouses recommended looking at feet daily. Also recommend pedicures to help with callouses.   PNA shot given today.

## 2014-02-03 ENCOUNTER — Telehealth: Payer: Self-pay | Admitting: Family Medicine

## 2014-02-03 NOTE — Telephone Encounter (Signed)
Will forward to PCP 

## 2014-02-03 NOTE — Telephone Encounter (Signed)
Pt recently prescribed abx, now has a yeast infection, pt wants to know if we can call something in

## 2014-02-04 MED ORDER — FLUCONAZOLE 150 MG PO TABS: 150.0000 mg | ORAL_TABLET | Freq: Once | ORAL | Status: DC

## 2014-02-04 NOTE — Telephone Encounter (Signed)
Patient informed, expressed understanding. 

## 2014-02-04 NOTE — Telephone Encounter (Signed)
Rx for diflucan sent in to pt's pharmacy. Please inform she should take one pill now, and if not better she can repeat the one pill in 3 days. I sent in two pills for her. Thanks, Leeanne Rio, MD

## 2014-02-06 ENCOUNTER — Ambulatory Visit: Payer: BC Managed Care – PPO | Admitting: *Deleted

## 2014-02-06 DIAGNOSIS — T8131XD Disruption of external operation (surgical) wound, not elsewhere classified, subsequent encounter: Secondary | ICD-10-CM

## 2014-02-06 NOTE — Progress Notes (Signed)
Patient in today for wound check. Steri-strips removed, wound almost completely healed, no drainage. Patient instructed to schedule follow up should any problems arise

## 2014-03-11 ENCOUNTER — Other Ambulatory Visit: Payer: Self-pay | Admitting: *Deleted

## 2014-03-11 ENCOUNTER — Telehealth: Payer: Self-pay | Admitting: *Deleted

## 2014-03-11 DIAGNOSIS — Z48812 Encounter for surgical aftercare following surgery on the circulatory system: Secondary | ICD-10-CM

## 2014-03-11 DIAGNOSIS — M79605 Pain in left leg: Secondary | ICD-10-CM

## 2014-03-11 NOTE — Telephone Encounter (Addendum)
Patient c/o increasing tightness, swelling and pain in her Left lower leg. She is afebrile. She is post bilateral external iliac vein stenting in 2008. She last saw Dr. Trula Slade in August 2015 and her stents were patent although the scan was limited due to body habitus. We will make an appt for her to see our NP to make sure her stents are patent now because of her increased edema.

## 2014-03-18 ENCOUNTER — Encounter: Payer: Self-pay | Admitting: Family

## 2014-03-19 ENCOUNTER — Ambulatory Visit (HOSPITAL_COMMUNITY)
Admission: RE | Admit: 2014-03-19 | Discharge: 2014-03-19 | Disposition: A | Discharge status: Home / Self Care | Payer: BC Managed Care – PPO | Source: Ambulatory Visit | Attending: Family | Admitting: Family

## 2014-03-19 ENCOUNTER — Ambulatory Visit (INDEPENDENT_AMBULATORY_CARE_PROVIDER_SITE_OTHER): Payer: BC Managed Care – PPO | Admitting: Family

## 2014-03-19 ENCOUNTER — Other Ambulatory Visit: Payer: Self-pay

## 2014-03-19 ENCOUNTER — Encounter: Payer: Self-pay | Admitting: Family

## 2014-03-19 VITALS — BP 138/98 | HR 72 | Resp 16 | Ht 62.0 in | Wt 241.0 lb

## 2014-03-19 DIAGNOSIS — Z48812 Encounter for surgical aftercare following surgery on the circulatory system: Secondary | ICD-10-CM | POA: Insufficient documentation

## 2014-03-19 DIAGNOSIS — M79605 Pain in left leg: Secondary | ICD-10-CM | POA: Diagnosis not present

## 2014-03-19 DIAGNOSIS — Z95828 Presence of other vascular implants and grafts: Secondary | ICD-10-CM | POA: Diagnosis not present

## 2014-03-19 DIAGNOSIS — M6289 Other specified disorders of muscle: Secondary | ICD-10-CM | POA: Insufficient documentation

## 2014-03-19 DIAGNOSIS — M629 Disorder of muscle, unspecified: Secondary | ICD-10-CM

## 2014-03-19 NOTE — Patient Instructions (Signed)
Venogram A venogram, or venography, is a procedure to look at the veins using X-ray and dye (contrast). Contrast helps the veins show up on X-rays. A venography evaluates vein abnormalities, identifies clots within veins such as deep vein thrombosis (DVT), and evaluates swelling in an arm or leg. LET [facility] CARE PROVIDER KNOW ABOUT:   Any allergies you have, especially to medicines, shellfish, iodine, and contrast.  All medicines you are taking, including vitamins, herbs, eye drops, creams, and over-the-counter medicines.  Any previous complications from this or other procedures.  Any smoking history.  Any possibility of pregnancy.  Any blood disorders you have.  Previous surgeries you have had.  Medical conditions you have. RISKS AND COMPLICATIONS  Generally, this is a safe procedure. However, as with any procedure, problems can occur. Possible problems include:  Blood clots.  Kidney problems.  Allergic reaction to the contrast.  Bleeding.  Infection.  X-ray exposure. BEFORE THE PROCEDURE   Ask your health care provider about changing or stopping your regular medicines. This is especially important if you are taking diabetes medicines or blood thinners.  Your health care provider may want you to have blood tests. These tests can help tell how well your kidneys and liver are working. They can also show how well your blood clots.   Do not eat or drink anything after midnight on the night before the procedure or as directed by your health care provider.  A blood sample may be drawn.  An IV tube will be placed into a vein in your arm or leg.  You will be asked to remove your clothing and put on a hospital gown.  You may be asked to remove your watch or jewelry.  Arrange for someone to drive you home after the procedure. If you receive pain medicine or sedatives during the procedure, it will be unsafe for you to drive for a few hours after the procedure. PROCEDURE    You will have an IV tube placed in a vein. This is where your health care provider will inject the dye. You may also be given medicine through the IV tube to help you relax (sedation).  During the exam, you will lie on an X-ray table. The table may be tilted in different directions during the procedure to help the dye move throughout your body. Safety straps will keep you secure if the table is tilted.  If the veins to be evaluated are in your arm or leg, a band may be wrapped around that arm or leg to make the veins stay full of blood. You may feel like your arm or leg is going to sleep.  When the contrast is injected into your vein, you may notice a hot, flushed feeling as it moves throughout your body. You may also notice a metallic taste in your mouth. Both of these sensations will go away after the test is complete.  You may be asked to lie in different positions or place your extremity in different positions.  At the end of the procedure, you will be given extra IV fluids to help flush the contrast material from your veins.  When the IV tube is removed, pressure will be applied to prevent bleeding. A bandage may be applied. AFTER THE PROCEDURE   You will be checked frequently after the procedure. You may feel sleepy during this time if you were given a sedation medicine during the procedure.  You may be given something to eat and drink.  You  will be instructed to drink a lot of fluids for the remainder of the day and into the next day, to help flush the contrast from your body. Document Released: 03/30/2009 Document Revised: 08/26/2013 Document Reviewed: 12/17/2012 [facility] Patient Information 2015 [place], [place]. This information is not intended to replace advice given to you by your health care provider. Make sure you discuss any questions you have with your health care provider.

## 2014-03-19 NOTE — Progress Notes (Signed)
Established chronic venous insufficiency   History of Present Illness  [name] is a 43 y.o. (Feb 18, 1971) female patient of Dr. Trula Slade who is s/p left external iliac vein stents. This was placed in December of 2008. It was a Cordis 12 x 60 self-expanding stent. It was placed for tightness and swelling in her left leg. She had a CT scan which showed compression of her left external iliac vein from lymph nodes. 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 She underwent angioplasty of her left external iliac vein 11/07/2012.  She does report that her left leg has never felt the same as the right. This predates her stent. She presents today with chief complaint: gradual onset left calf tightness for one week, denies pain, denies fever or chills.  The patient denies dyspnea. The patient does not recall any injury or precipitating factor. She has no swelling in either leg in the morning. She is wearing a thigh high stocking on her left leg only. She states her last A1C had improved to 7.0, in control DM. She does not smoke.     Past Medical History  Diagnosis Date  . Hypertension   . Allergy     seasonal  . Obesity   . Hemorrhoids   . Diabetes mellitus   . Abnormal Pap smear   . Plantar fasciitis   . Atrial fibrillation     Social History History  Substance Use Topics  . Smoking status: Never Smoker   . Smokeless tobacco: Never Used  . Alcohol Use: No    Family History Family History  Problem Relation Age of Onset  . Diabetes Mother   . Hypertension Mother   . Hyperlipidemia Mother   . Heart disease Sister     [employer]  . Heart attack Neg Hx   . Sudden death Neg Hx   . Hypertension Other     Surgical History Past Surgical History  Procedure Laterality Date  . Abdominal hysterectomy  2005    for Fibroids  . Iliac vein angioplasty / stenting    . Cesarean section    . Tubal ligation    . Skin tag removal  Sept. 25, 2015   Right uppper arm    Allergies  Allergen Reactions  . Other     Pt. States every time she is "put to sleep" she because becomes nauseated and runs a fever"    Current Outpatient Prescriptions  Medication Sig Dispense Refill  . amLODipine (NORVASC) 10 MG tablet Take 1 tablet (10 mg total) by mouth daily. 30 tablet 3  . aspirin 81 MG EC tablet Take 81 mg by mouth daily.     Marland Kitchen atorvastatin (LIPITOR) 40 MG tablet Take 1 tablet (40 mg total) by mouth daily. 90 tablet 3  . cyclobenzaprine (FLEXERIL) 5 MG tablet Take 1 tablet (5 mg total) by mouth daily as needed for muscle spasms. 30 tablet 0  . fluconazole (DIFLUCAN) 150 MG tablet Take 1 tablet (150 mg total) by mouth once. Repeat in 3 days if not better. 2 tablet 0  . fluticasone (FLONASE) 50 MCG/ACT nasal spray Place 2 sprays into both nostrils daily. 16 g 2  . glimepiride (AMARYL) 1 MG tablet Take 1 tablet (1 mg total) by mouth daily with breakfast. 30 tablet 3  . glucose blood (ONE TOUCH ULTRA TEST) test strip 1 each by Other route daily as needed for other (symptoms of hypo- or hyperglycemia). Use as instructed 100  100 each 12  . Glucose Blood KIT Test blood sugar daily- fasting Dx 250.00 1 kit 0  . Lancet Devices (AUTO-LANCETS) MISC Test blood sugar as directed  Dx 250.00 100 each 12  . lisinopril-hydrochlorothiazide (PRINZIDE,ZESTORETIC) 20-25 MG per tablet take 1 tablet by mouth once daily 30 tablet 2  . metFORMIN (GLUCOPHAGE-XR) 500 MG 24 hr tablet Take 2 tablets (1,000 mg total) by mouth daily with breakfast. 60 tablet 2  . omeprazole (PRILOSEC) 20 MG capsule Take 1 capsule (20 mg total) by mouth daily. 30 capsule 3  . ranitidine (ZANTAC) 150 MG tablet Take 1 tablet (150 mg total) by mouth 2 (two) times daily. 60 tablet 2  .  amoxicillin-clavulanate (AUGMENTIN) 875-125 MG per tablet Take 1 tablet by mouth 2 (two) times daily. (Patient not taking: Reported on 03/19/2014) 14 tablet 0  . doxycycline (VIBRA-TABS) 100 MG tablet Take 1 tablet (100 mg total) by mouth 2 (two) times daily. (Patient not taking: Reported on 03/19/2014) 14 tablet 0   No current facility-administered medications for this visit.    REVIEW OF SYSTEMS: see HPI for pertinent positives and negatives   Physical Examination  Filed Vitals:   03/19/14 0934  BP: 138/98  Pulse: 72  Resp: 16  Height: 5' 2" (1.575 m)  Weight: 241 lb (109.317 kg)  SpO2: 99%   Body mass index is 44.07 kg/(m^2).  General: The patient appears their stated age, morbidly obese female.  HEENT: No gross abnormalities Pulmonary: Respirations are non-labored Abdomen: Soft and non-tender. Musculoskeletal: There are no major deformities. Pretibial pitting edema: 2+ right LE, 1+ left LE.  Neurologic: No focal weakness or paresthesias are detected. Skin: There are no ulcer or rashes noted, large dark birthmark on right leg. Psychiatric: The patient has normal affect. Cardiovascular: There is a regular rate and rhythm.    Vascular: Vessel Right Left  Radial Palpable Palpable  Aorta Not palpable N/A  Femoral Not Palpable Not Palpable  Popliteal Not palpable Not palpable  PT Not Palpable Not Palpable  DP Palpable Palpable   Non-Invasive Vascular Imaging  LLE Venous Duplex (Date: 03/19/2014):  VASCULAR LAB EVALUATION    INDICATION: Evaluation of left external iliac vein stent.    PREVIOUS INTERVENTION(S): Left external iliac vein stenting 2008 for severe left leg swelling. Angioplasty of left external iliac vein 11/07/2012.    DUPLEX EXAM: Left leg Duplex.    FINDINGS: Technically difficult exam due to patient body habitus and overlying bowel gas.    IMPRESSION:  Left common femoral vein and  external iliac veins show phasic flow and are incompetent with Valsalva maneuvers.  The left stent appears patent with limited visualization. The IVC appears patent.      Medical Decision Making  [name] is a 43 y.o. female who is s/p left external iliac vein stenting 2008 for severe left leg swelling and angioplasty of left external iliac vein 11/07/2012. She presents with: gradual left lower leg swelling for the last week. She wears a thigh high stocking on her left leg, not on her right leg, and she has 2+ pretibial pitting edema in her right leg, 1+ in her left leg, no pain on palpation of either calf, no erythema. Today's venous Duplex of left leg reveals left common femoral vein and external iliac veins show phasic flow and are incompetent with Valsalva maneuvers.  The left stent appears patent with limited visualization. The IVC appears patent.   Based on the patient's vascular studies and examination, and after   examination, and after discussing with Dr. Trula Slade, I have offered the patient: will schedule pt for venogram with Dr. Trula Slade to evaluate patency of the stent.  I discussed with the patient the use of  20-30 mm thigh high compression stockings, wear during the day, may remove at bedtime, prescription given for same, printed information given re ETI.  Thank you for allowing [place] to participate in this patient's care.  NICKEL, Sharmon Leyden, RN, MSN, FNP-C [facility] Office: [phone]  03/19/2014, 9:37 AM  Clinic MD: Trula Slade

## 2014-03-25 ENCOUNTER — Telehealth: Payer: Self-pay | Admitting: Family Medicine

## 2014-03-25 MED ORDER — HYDROCHLOROTHIAZIDE 25 MG PO TABS: 25.0000 mg | ORAL_TABLET | Freq: Every day | ORAL | Status: DC

## 2014-03-25 MED ORDER — LISINOPRIL 40 MG PO TABS: 40.0000 mg | ORAL_TABLET | Freq: Every day | ORAL | Status: DC

## 2014-03-25 NOTE — Telephone Encounter (Signed)
Called pt to discuss BP elevation from her last BP check. Still above goal, but better at recent visit with vascular surgery office (diastolic still over 90 at that visit).  Will increase lisinopril to 40mg  daily. Pt has been on lisin-HCTZ 20-25mg  but now prefers that these be split into different pills. Will send in new rx.  Schedule appt for pt to see me on 12/17 for BP recheck and labwork.  Leeanne Rio, MD

## 2014-04-03 ENCOUNTER — Encounter (HOSPITAL_COMMUNITY): Payer: Self-pay | Admitting: Surgery

## 2014-04-10 ENCOUNTER — Ambulatory Visit: Payer: BC Managed Care – PPO | Admitting: Family Medicine

## 2014-04-14 MED ORDER — SODIUM CHLORIDE 0.9 % IV SOLN
INTRAVENOUS | Status: DC
  Administered 2014-04-15: 1000 mL via INTRAVENOUS

## 2014-04-15 ENCOUNTER — Telehealth: Payer: Self-pay | Admitting: Surgery

## 2014-04-15 ENCOUNTER — Encounter (HOSPITAL_COMMUNITY)
Admission: RE | Disposition: A | Discharge status: Home / Self Care | Payer: Self-pay | Source: Ambulatory Visit | Attending: Surgery

## 2014-04-15 ENCOUNTER — Ambulatory Visit (HOSPITAL_COMMUNITY)
Admission: RE | Admit: 2014-04-15 | Discharge: 2014-04-15 | Disposition: A | Discharge status: Home / Self Care | Payer: BC Managed Care – PPO | Source: Ambulatory Visit | Attending: Surgery | Admitting: Surgery

## 2014-04-15 ENCOUNTER — Encounter (HOSPITAL_COMMUNITY): Payer: Self-pay | Admitting: Surgery

## 2014-04-15 ENCOUNTER — Other Ambulatory Visit: Payer: Self-pay | Admitting: *Deleted

## 2014-04-15 ENCOUNTER — Other Ambulatory Visit: Payer: Self-pay

## 2014-04-15 DIAGNOSIS — I517 Cardiomegaly: Secondary | ICD-10-CM | POA: Diagnosis not present

## 2014-04-15 DIAGNOSIS — M7989 Other specified soft tissue disorders: Secondary | ICD-10-CM | POA: Diagnosis present

## 2014-04-15 DIAGNOSIS — I10 Essential (primary) hypertension: Secondary | ICD-10-CM

## 2014-04-15 DIAGNOSIS — Z48812 Encounter for surgical aftercare following surgery on the circulatory system: Secondary | ICD-10-CM

## 2014-04-15 DIAGNOSIS — I82412 Acute embolism and thrombosis of left femoral vein: Secondary | ICD-10-CM | POA: Diagnosis not present

## 2014-04-15 HISTORY — PX: VENOGRAM: SHX5497

## 2014-04-15 LAB — POCT I-STAT, CHEM 8
BUN: 13 mg/dL (ref 6–23)
CHLORIDE: 103 meq/L (ref 96–112)
Calcium, Ion: 1.24 mmol/L — ABNORMAL HIGH (ref 1.12–1.23)
Creatinine, Ser: 1.1 mg/dL (ref 0.50–1.10)
GLUCOSE: 163 mg/dL — AB (ref 70–99)
HCT: 43 % (ref 36.0–46.0)
Hemoglobin: 14.6 g/dL (ref 12.0–15.0)
Potassium: 3.6 mmol/L (ref 3.5–5.1)
Sodium: 140 mmol/L (ref 135–145)
TCO2: 22 mmol/L (ref 0–100)

## 2014-04-15 LAB — GLUCOSE, CAPILLARY: Glucose-Capillary: 166 mg/dL — ABNORMAL HIGH (ref 70–99)

## 2014-04-15 SURGERY — VENOGRAM
Anesthesia: LOCAL

## 2014-04-15 MED ORDER — ALUM & MAG HYDROXIDE-SIMETH 200-200-20 MG/5ML PO SUSP: 15.0000 mL | ORAL | Status: DC | PRN

## 2014-04-15 MED ORDER — LIDOCAINE HCL (PF) 1 % IJ SOLN
INTRAMUSCULAR | Status: AC
  Filled 2014-04-15: qty 30

## 2014-04-15 MED ORDER — SODIUM CHLORIDE 0.9 % IV SOLN: 1.0000 mL/kg/h | INTRAVENOUS | Status: DC

## 2014-04-15 MED ORDER — MIDAZOLAM HCL 2 MG/2ML IJ SOLN
INTRAMUSCULAR | Status: AC
  Filled 2014-04-15: qty 2

## 2014-04-15 MED ORDER — PHENOL 1.4 % MT LIQD: 1.0000 | OROMUCOSAL | Status: DC | PRN

## 2014-04-15 MED ORDER — HYDRALAZINE HCL 20 MG/ML IJ SOLN
INTRAMUSCULAR | Status: AC
  Filled 2014-04-15: qty 1

## 2014-04-15 MED ORDER — LABETALOL HCL 5 MG/ML IV SOLN: 10.0000 mg | INTRAVENOUS | Status: DC | PRN

## 2014-04-15 MED ORDER — OXYCODONE HCL 5 MG PO TABS: 5.0000 mg | ORAL_TABLET | ORAL | Status: DC | PRN

## 2014-04-15 MED ORDER — GUAIFENESIN-DM 100-10 MG/5ML PO SYRP: 15.0000 mL | ORAL_SOLUTION | ORAL | Status: DC | PRN

## 2014-04-15 MED ORDER — ONDANSETRON HCL 4 MG/2ML IJ SOLN: 4.0000 mg | Freq: Four times a day (QID) | INTRAMUSCULAR | Status: DC | PRN

## 2014-04-15 MED ORDER — FENTANYL CITRATE 0.05 MG/ML IJ SOLN
INTRAMUSCULAR | Status: AC
  Filled 2014-04-15: qty 2

## 2014-04-15 MED ORDER — ACETAMINOPHEN 325 MG PO TABS: 325.0000 mg | ORAL_TABLET | ORAL | Status: DC | PRN

## 2014-04-15 MED ORDER — ACETAMINOPHEN 325 MG RE SUPP: 325.0000 mg | RECTAL | Status: DC | PRN

## 2014-04-15 MED ORDER — METOPROLOL TARTRATE 1 MG/ML IV SOLN: 2.0000 mg | INTRAVENOUS | Status: DC | PRN

## 2014-04-15 MED ORDER — HYDRALAZINE HCL 20 MG/ML IJ SOLN
5.0000 mg | INTRAMUSCULAR | Status: DC | PRN
  Administered 2014-04-15: 5 mg via INTRAVENOUS

## 2014-04-15 MED ORDER — HEPARIN (PORCINE) IN NACL 2-0.9 UNIT/ML-% IJ SOLN
INTRAMUSCULAR | Status: AC
  Filled 2014-04-15: qty 500

## 2014-04-15 NOTE — Op Note (Signed)
    Patient name: [name] MRN: [id number] DOB: May 03, 1970 Sex: female  04/15/2014 Pre-operative Diagnosis: Recurrent left leg swelling Post-operative diagnosis:  Same Surgeon:  Eldridge Abrahams Procedure Performed:  1.  Ultrasound-guided access, left common femoral vein  2.  Left iliac venogram and IVC gram  3.  Balloon venoplasty, left external iliac vein     Indications:  The patient has a history of left external iliac vein stenosis.  This was treated with stenting.  Stenting resolve her symptoms.  She has had progressive development of recurrent swelling.  Her initial stent was placed in 2008.  In July 2014 she had balloon angioplasty of in-stent stenosis.  A 1260 Cordis stent was initially placed.  It was dilated in 2014 with a 12 mm balloon.  Procedure:  The patient was identified in the holding area and taken to room 8.  The patient was then placed supine on the table and prepped and draped in the usual sterile fashion.  A time out was called.  Ultrasound was used to evaluate the left common femoral vein.  Left common femoral vein was cannulated under ultrasound guidance with a micropuncture needle.  A wire was advanced without resistance followed by the micropuncture sheath.  Contrast injections were performed through the sheath.  Findings:   Left iliac vein:  A stent is visualized within the left external iliac vein.  There is approximately 50-60% in-stent stenosis.  The common iliac vein is widely patent  Inferior vena cava:  No stenosis identified    Intervention:  After the above images were acquired, the decision was made to proceed with intervention.  Over a 035 wire a 7 French sheath was inserted.  A Benson wire was advanced across the lesion.  I initially used a 12x20 balloon but did not get this to stay in position therefore I upsized to a 12 x 40 Mustang balloon.  This was taken to 12 atm for 30 seconds.  Follow-up imaging revealed significant improvement in the  stenosis to less than 10%.  Wires and balloons were removed followed by the sheath.  Manual compression was used for hemostasis.  Impression:  #1  successful balloon venoplasty of recurrent in-stent stenosis within the left external iliac vein stent.  A 12 mm balloon was utilized.  #2  no significant IVC narrowing   V. Annamarie Major, M.D. [facility] Office: [phone] Pager:  [phone]

## 2014-04-15 NOTE — Telephone Encounter (Addendum)
-----   Message from Sherrye Payor, RN sent at 04/15/2014 12:00 PM EST ----- Regarding: FW: Kay log; also needs 3-4 wk. f/u with VWB and Bil. venous reflux study   ----- Message -----    From: Sherrye Payor, RN    Sent: 04/15/2014  11:43 AM      To: Mena Goes, RN, [facility] Charge Pool Subject: [name]; also needs 3-4 wk. f/u with VWB and#    ----- Message -----    From: Serafina Mitchell, MD    Sent: 04/15/2014   8:38 AM      To: [facility] Charge Pool  04/15/2014:  Surgeon:  Eldridge Abrahams Procedure Performed:  1.  Ultrasound-guided access, left common femoral vein  2.  Left iliac venogram and IVC gram  3.  Balloon venoplasty, left external iliac vein   Please schedule the patient to follow-up in my office within the next 3-4 weeks.  She will need bilateral venous reflux testing    04/15/14: spoke with pt, dpm

## 2014-04-15 NOTE — Discharge Instructions (Signed)
Venogram, Care After °Refer to this sheet in the next few weeks. These instructions provide you with information on caring for yourself after your procedure. Your health care provider may also give you more specific instructions. Your treatment has been planned according to current medical practices, but problems sometimes occur. Call your health care provider if you have any problems or questions after your procedure. °WHAT TO EXPECT AFTER THE PROCEDURE °After your procedure, it is typical to have the following sensations: °· Mild discomfort at the catheter insertion site. °HOME CARE INSTRUCTIONS  °· Take all medicines exactly as directed. °· Follow any prescribed diet. °· Follow instructions regarding both rest and physical activity. °· Drink more fluids for the first several days after the procedure in order to help flush dye from your kidneys. °SEEK MEDICAL CARE IF: °· You develop a rash. °· You have fever not controlled by medicine. °SEEK IMMEDIATE MEDICAL CARE IF: °· There is pain, drainage, bleeding, redness, swelling, warmth or a red streak at the site of the IV tube. °· The extremity where your IV tube was placed becomes discolored, numb, or cool. °· You have difficulty breathing or shortness of breath. °· You develop chest pain. °· You have excessive dizziness or fainting. °Document Released: 01/30/2013 Document Revised: 04/16/2013 Document Reviewed: 01/30/2013 °[employer]® Patient Information ©2015 [employer]. This information is not intended to replace advice given to you by your health care provider. Make sure you discuss any questions you have with your health care provider. ° °

## 2014-04-16 MED ORDER — AMLODIPINE BESYLATE 10 MG PO TABS: 10.0000 mg | ORAL_TABLET | Freq: Every day | ORAL | Status: DC

## 2014-04-23 ENCOUNTER — Encounter: Payer: Self-pay | Admitting: Family Medicine

## 2014-04-23 ENCOUNTER — Ambulatory Visit (INDEPENDENT_AMBULATORY_CARE_PROVIDER_SITE_OTHER): Payer: BC Managed Care – PPO | Admitting: Family Medicine

## 2014-04-23 VITALS — BP 132/90 | HR 80 | Temp 98.2°F | Ht 62.0 in | Wt 238.5 lb

## 2014-04-23 DIAGNOSIS — K219 Gastro-esophageal reflux disease without esophagitis: Secondary | ICD-10-CM | POA: Diagnosis not present

## 2014-04-23 DIAGNOSIS — I10 Essential (primary) hypertension: Secondary | ICD-10-CM | POA: Diagnosis not present

## 2014-04-23 DIAGNOSIS — E118 Type 2 diabetes mellitus with unspecified complications: Secondary | ICD-10-CM

## 2014-04-23 LAB — BASIC METABOLIC PANEL
BUN: 10 mg/dL (ref 6–23)
CO2: 28 mEq/L (ref 19–32)
CREATININE: 1.06 mg/dL (ref 0.50–1.10)
Calcium: 9.5 mg/dL (ref 8.4–10.5)
Chloride: 101 mEq/L (ref 96–112)
Glucose, Bld: 165 mg/dL — ABNORMAL HIGH (ref 70–99)
Potassium: 4.1 mEq/L (ref 3.5–5.3)
Sodium: 138 mEq/L (ref 135–145)

## 2014-04-23 LAB — POCT GLYCOSYLATED HEMOGLOBIN (HGB A1C): Hemoglobin A1C: 7.5

## 2014-04-23 NOTE — Patient Instructions (Addendum)
It was great to see you again today!  Your BP now looks good. Checking bloodwork today. I will call you if your test results are not normal.  Otherwise, I will send you a letter.  If you do not hear from me with in 2 weeks please call our office.     Schedule an appt with your eye doctor.  Follow up in 3 months.  Be well, Dr. Ardelia Mems

## 2014-04-25 NOTE — Assessment & Plan Note (Signed)
Check a1c today. Reports good sugars taken at home.  Cardiac: on statin, aspirin Renal: on ACE, check renal fxn today Eye: overdue, schedule appointment with eye doc Foot: due Oct 2016

## 2014-04-25 NOTE — Assessment & Plan Note (Signed)
Well-controlled.  Continue current regimen. 

## 2014-04-25 NOTE — Progress Notes (Signed)
Patient ID: [name], female   DOB: 12-19-1970, 44 y.o.   MRN: [id number]  HPI:  HTN: tolerating increased dose of lisinopril. Denies CP or SOB. Does not check BP at home.  DM: gets 120's-160's when checks sugars at home. Due for eye appt. Working on decreasing sugar intake.   GERD - needs refill on omeprazole and ranitidine. These work well for her.  ROS: See HPI.  [place]: nonsmoker. Follows with vascular surgery  PHYSICAL EXAM: BP 132/90 mmHg  Pulse 80  Temp(Src) 98.2 F (36.8 C) (Oral)  Ht 5\' 2"  (1.575 m)  Wt 238 lb 8 oz (108.183 kg)  BMI 43.61 kg/m2 Gen: NAD, pleasant, cooperative HEENT: NCAT, MMM Heart: RRR Lungs: CTAB, NWOB Neuro: grossly nonfocal, speech intact Ext: No appreciable lower extremity edema bilaterally   ASSESSMENT/PLAN:  HYPERTENSION, BENIGN SYSTEMIC Well controlled. Continue current regimen. Asked pt to check BP at home several x per week and call us if gets numbers over 140/90. Check BMET today due to recent increase in lisinopril.  Diabetes mellitus Check a1c today. Reports good sugars taken at home.  Cardiac: on statin, aspirin Renal: on ACE, check renal fxn today Eye: overdue, schedule appointment with eye doc Foot: due Oct 2016   GASTROESOPHAGEAL REFLUX, NO ESOPHAGITIS Well controlled. Continue current regimen.    FOLLOW UP: F/u in 3 months for chronic medical problems.  [place]. Ardelia Mems, [place]

## 2014-04-25 NOTE — Assessment & Plan Note (Signed)
Well controlled. Continue current regimen. Asked pt to check BP at home several x per week and call us if gets numbers over 140/90. Check BMET today due to recent increase in lisinopril.

## 2014-04-30 NOTE — H&P (Signed)
Established chronic venous insufficiency   History of Present Illness  [name] is a 44 y.o. (08/24/1970) female patient of Dr. Trula Slade who is s/p left external iliac vein stents. This was placed in December of 2008. It was a Cordis 12 x 60 self-expanding stent. It was placed for tightness and swelling in her left leg. She had a CT scan which showed compression of her left external iliac vein from lymph nodes. 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 She underwent angioplasty of her left external iliac vein 11/07/2012. She does report that her left leg has never felt the same as the right. This predates her stent. She presents today with chief complaint: gradual onset left calf tightness for one week, denies pain, denies fever or chills.  The patient denies dyspnea. The patient does not recall any injury or precipitating factor. She has no swelling in either leg in the morning. She is wearing a thigh high stocking on her left leg only. She states her last A1C had improved to 7.0, in control DM. She does not smoke.     Past Medical History  Diagnosis Date  . Hypertension   . Allergy     seasonal  . Obesity   . Hemorrhoids   . Diabetes mellitus   . Abnormal Pap smear   . Plantar fasciitis   . Atrial fibrillation     Social History History  Substance Use Topics  . Smoking status: Never Smoker   . Smokeless tobacco: Never Used  . Alcohol Use: No    Family History Family History  Problem Relation Age of Onset  . Diabetes Mother   . Hypertension Mother   . Hyperlipidemia Mother   . Heart disease Sister     [employer]  . Heart attack Neg Hx   . Sudden death Neg Hx   . Hypertension Other     Surgical History Past Surgical History  Procedure Laterality Date  . Abdominal hysterectomy  2005    for Fibroids  . Iliac vein angioplasty /  stenting    . Cesarean section    . Tubal ligation    . Skin tag removal  Sept. 25, 2015    Right uppper arm    Allergies  Allergen Reactions  . Other     Pt. States every time she is "put to sleep" she because becomes nauseated and runs a fever"    Current Outpatient Prescriptions  Medication Sig Dispense Refill  . amLODipine (NORVASC) 10 MG tablet Take 1 tablet (10 mg total) by mouth daily. 30 tablet 3  . aspirin 81 MG EC tablet Take 81 mg by mouth daily.     Marland Kitchen atorvastatin (LIPITOR) 40 MG tablet Take 1 tablet (40 mg total) by mouth daily. 90 tablet 3  . cyclobenzaprine (FLEXERIL) 5 MG tablet Take 1 tablet (5 mg total) by mouth daily as needed for muscle spasms. 30 tablet 0  . fluconazole (DIFLUCAN) 150 MG tablet Take 1 tablet (150 mg total) by mouth once. Repeat in 3 days if not better. 2 tablet 0  . fluticasone (FLONASE) 50 MCG/ACT nasal spray Place 2 sprays into both nostrils daily. 16 g 2  . glimepiride (AMARYL) 1 MG tablet Take 1 tablet (1 mg total) by mouth daily with breakfast. 30 tablet 3  . glucose blood (ONE TOUCH ULTRA TEST) test strip 1 each by Other route daily as needed for other (symptoms of hypo- or hyperglycemia). Use as instructed  100 each 12  . Glucose Blood KIT Test blood sugar daily- fasting Dx 250.00 1 kit 0  . Lancet Devices (AUTO-LANCETS) MISC Test blood sugar as directed  Dx 250.00 100 each 12  . lisinopril-hydrochlorothiazide (PRINZIDE,ZESTORETIC) 20-25 MG per tablet take 1 tablet by mouth once daily 30 tablet 2  . metFORMIN (GLUCOPHAGE-XR) 500 MG 24 hr tablet Take 2 tablets (1,000 mg total) by mouth daily with breakfast. 60 tablet 2  . omeprazole (PRILOSEC) 20 MG capsule Take 1 capsule (20 mg total) by mouth daily. 30 capsule 3  . ranitidine (ZANTAC) 150 MG tablet Take 1 tablet (150 mg total) by mouth 2 (two) times daily. 60 tablet 2  .  amoxicillin-clavulanate (AUGMENTIN) 875-125 MG per tablet Take 1 tablet by mouth 2 (two) times daily. (Patient not taking: Reported on 03/19/2014) 14 tablet 0  . doxycycline (VIBRA-TABS) 100 MG tablet Take 1 tablet (100 mg total) by mouth 2 (two) times daily. (Patient not taking: Reported on 03/19/2014) 14 tablet 0   No current facility-administered medications for this visit.    REVIEW OF SYSTEMS: see HPI for pertinent positives and negatives   Physical Examination  Filed Vitals:   03/19/14 0934  BP: 138/98  Pulse: 72  Resp: 16  Height: '5\' 2"'  (1.575 m)  Weight: 241 lb (109.317 kg)  SpO2: 99%   Body mass index is 44.07 kg/(m^2).  General: The patient appears their stated age, morbidly obese female.  HEENT: No gross abnormalities Pulmonary: Respirations are non-labored Abdomen: Soft and non-tender. Musculoskeletal: There are no major deformities. Pretibial pitting edema: 2+ right LE, 1+ left LE.  Neurologic: No focal weakness or paresthesias are detected. Skin: There are no ulcer or rashes noted, large dark birthmark on right leg. Psychiatric: The patient has normal affect. Cardiovascular: There is a regular rate and rhythm.    Vascular: Vessel Right Left  Radial Palpable Palpable  Aorta Not palpable N/A  Femoral Not Palpable Not Palpable  Popliteal Not palpable Not palpable  PT Not Palpable Not Palpable  DP Palpable Palpable   Non-Invasive Vascular Imaging  LLE Venous Duplex (Date: 03/19/2014):  VASCULAR LAB EVALUATION    INDICATION: Evaluation of left external iliac vein stent.    PREVIOUS INTERVENTION(S): Left external iliac vein stenting 2008 for severe left leg swelling. Angioplasty of left external iliac vein 11/07/2012.    DUPLEX EXAM: Left leg Duplex.    FINDINGS: Technically difficult exam due to patient body habitus and overlying bowel gas.    IMPRESSION:  Left common femoral vein and  external iliac veins show phasic flow and are incompetent with Valsalva maneuvers.  The left stent appears patent with limited visualization. The IVC appears patent.      Medical Decision Making  [name] is a 44 y.o. female who is s/p left external iliac vein stenting 2008 for severe left leg swelling and angioplasty of left external iliac vein 11/07/2012. She presents with: gradual left lower leg swelling for the last week. She wears a thigh high stocking on her left leg, not on her right leg, and she has 2+ pretibial pitting edema in her right leg, 1+ in her left leg, no pain on palpation of either calf, no erythema. Today's venous Duplex of left leg reveals left common femoral vein and external iliac veins show phasic flow and are incompetent with Valsalva maneuvers.  The left stent appears patent with limited visualization. The IVC appears patent.   Based on the patient's vascular studies and examination, and after  discussing with Dr. Trula Slade, I have offered the patient: will schedule pt for venogram with Dr. Trula Slade to evaluate patency of the stent.  I discussed with the patient the use of 20-30 mm thigh high compression stockings, wear during the day, may remove at bedtime, prescription given for same, printed information given re ETI.  Thank you for allowing [place] to participate in this patient's care.  NICKEL, Sharmon Leyden, RN, MSN, FNP-C [facility] Office: [phone]  03/19/2014, 9:37 AM  Clinic MD: Trula Slade

## 2014-05-06 ENCOUNTER — Encounter: Payer: Self-pay | Admitting: Family Medicine

## 2014-05-09 ENCOUNTER — Encounter: Payer: Self-pay | Admitting: Surgery

## 2014-05-12 ENCOUNTER — Ambulatory Visit (HOSPITAL_COMMUNITY)
Admission: RE | Admit: 2014-05-12 | Discharge: 2014-05-12 | Disposition: A | Discharge status: Home / Self Care | Payer: BLUE CROSS/BLUE SHIELD | Source: Ambulatory Visit | Attending: Surgery | Admitting: Surgery

## 2014-05-12 ENCOUNTER — Encounter: Payer: Self-pay | Admitting: Surgery

## 2014-05-12 ENCOUNTER — Ambulatory Visit (INDEPENDENT_AMBULATORY_CARE_PROVIDER_SITE_OTHER): Payer: BLUE CROSS/BLUE SHIELD | Admitting: Surgery

## 2014-05-12 VITALS — BP 121/79 | HR 88 | Ht 62.0 in | Wt 241.0 lb

## 2014-05-12 DIAGNOSIS — I872 Venous insufficiency (chronic) (peripheral): Secondary | ICD-10-CM

## 2014-05-12 DIAGNOSIS — Z48812 Encounter for surgical aftercare following surgery on the circulatory system: Secondary | ICD-10-CM | POA: Diagnosis not present

## 2014-05-12 DIAGNOSIS — M7989 Other specified soft tissue disorders: Secondary | ICD-10-CM | POA: Diagnosis not present

## 2014-05-12 NOTE — Progress Notes (Signed)
Patient name: [name] MRN: [id number] DOB: [birth year] Sex: female     Chief Complaint  Patient presents with  . Re-evaluation    s/p Lt ext iliac vein ballon venoplasty    HISTORY OF PRESENT ILLNESS:  patient comes back for follow-up. In December 2008, she underwent stenting of her left external iliac vein using a 12 x 60 Cordis self-expanding stent.  This was done for swelling.  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  In July 2014, she underwent angioplasty.  In December 2015 she began having recurrence of her symptoms.  On 04/15/2014, she underwent balloon venoplasty of in-stent stenosis.  She states today that her symptoms have improved.  When I saw her in December, she felt that she was having bilateral swelling, therefore I told her to work compression stockings come back for a venous reflux examination.  She states that the stockings have helped.  Past Medical History  Diagnosis Date  . Hypertension   . Allergy     seasonal  . Obesity   . Hemorrhoids   . Diabetes mellitus   . Abnormal Pap smear   . Plantar fasciitis   . Atrial fibrillation     Past Surgical History  Procedure Laterality Date  . Abdominal hysterectomy  2005    for Fibroids  . Iliac vein angioplasty / stenting    . Cesarean section    . Tubal ligation    . Skin tag removal  Sept. 25, 2015    Right uppper arm  . Venogram N/A 11/07/2012    Procedure: VENOGRAM;  Surgeon: Serafina Mitchell, MD;  Location: [facility] CATH LAB;  Service: Cardiovascular;  Laterality: N/A;  . Venogram N/A 04/15/2014    Procedure: VENOGRAM;  Surgeon: Serafina Mitchell, MD;  Location: [facility] CATH LAB;  Service: Cardiovascular;  Laterality: N/A;    History   Social History  . Marital Status: Single    Spouse Name: N/A    Number of Children: N/A  . Years of Education: N/A   Occupational History  . Not on file.   Social History Main Topics    . Smoking status: Never Smoker   . Smokeless tobacco: Never Used  . Alcohol Use: No  . Drug Use: No  . Sexual Activity: Not on file   Other Topics Concern  . Not on file   Social History Narrative    Family History  Problem Relation Age of Onset  . Diabetes Mother   . Hypertension Mother   . Hyperlipidemia Mother   . Heart disease Sister     [employer]  . Heart attack Neg Hx   . Sudden death Neg Hx   . Hypertension Other     Allergies as of 05/12/2014 - Review Complete 05/12/2014  Allergen Reaction Noted  . Other  11/07/2012  . Tape Rash 04/04/2014    Current Outpatient Prescriptions on File Prior to Visit  Medication Sig Dispense Refill  . acetaminophen (TYLENOL) 650 MG CR tablet Take 650 mg by mouth every 8 (eight) hours as needed for pain.    Marland Kitchen amLODipine (NORVASC) 10 MG tablet Take 1 tablet (10 mg total) by mouth daily. 30 tablet 3  . aspirin 81 MG EC tablet Take 81 mg by mouth daily.     Marland Kitchen atorvastatin (LIPITOR) 40 MG tablet Take 1 tablet (40 mg total) by mouth daily. 90 tablet  3  . cyclobenzaprine (FLEXERIL) 5 MG tablet Take 1 tablet (5 mg total) by mouth daily as needed for muscle spasms. 30 tablet 0  . fluconazole (DIFLUCAN) 150 MG tablet Take 1 tablet (150 mg total) by mouth once. Repeat in 3 days if not better. 2 tablet 0  . fluticasone (FLONASE) 50 MCG/ACT nasal spray Place 2 sprays into both nostrils daily. 16 g 2  . glimepiride (AMARYL) 1 MG tablet Take 1 tablet (1 mg total) by mouth daily with breakfast. 30 tablet 3  . glucose blood (ONE TOUCH ULTRA TEST) test strip 1 each by Other route daily as needed for other (symptoms of hypo- or hyperglycemia). Use as instructed 100 each 12  . Glucose Blood KIT Test blood sugar daily- fasting Dx 250.00 1 kit 0  . hydrochlorothiazide (HYDRODIURIL) 25 MG tablet Take 1 tablet (25 mg total) by mouth daily. 90 tablet 3  . Lancet Devices (AUTO-LANCETS) MISC Test blood sugar as directed  Dx 250.00 100 each 12  .  lisinopril (PRINIVIL,ZESTRIL) 40 MG tablet Take 1 tablet (40 mg total) by mouth daily. 90 tablet 0  . metFORMIN (GLUCOPHAGE-XR) 500 MG 24 hr tablet Take 2 tablets (1,000 mg total) by mouth daily with breakfast. 60 tablet 2  . omeprazole (PRILOSEC) 20 MG capsule Take 1 capsule (20 mg total) by mouth daily. 30 capsule 3  . ranitidine (ZANTAC) 150 MG tablet Take 1 tablet (150 mg total) by mouth 2 (two) times daily. 60 tablet 2   No current facility-administered medications on file prior to visit.     REVIEW OF SYSTEMS:  please see history of present illness, otherwise all systems are negative    PHYSICAL EXAMINATION:   Vital signs are BP 121/79 mmHg  Pulse 88  Ht 5' 2" (1.575 m)  Wt 241 lb (109.317 kg)  BMI 44.07 kg/m2  SpO2 100% General: The patient appears their stated age. HEENT:  No gross abnormalities Pulmonary:  Non labored breathing Musculoskeletal: There are no major deformities. Neurologic: No focal weakness or paresthesias are detected, Skin: There are no ulcer or rashes noted. Psychiatric: The patient has normal affect. Cardiovascular: There is a regular rate and rhythm without significant murmur appreciated.   Diagnostic Studies  Reflux evaluation was performed today bilaterally.  I have independently reviewed this study. There is reflux in the common femoral vein , saphenofemoral junction and saphenous vein bilaterally.  Maximum vein diameters within the saphenous vein on the right are 0.31 , and on the left are 0.27  Assessment:  bilateral venous insufficiency History of left external iliac vein stent Plan:  based on the ultrasound, I do not think that the findings are significant enough to proceed with laser ablation.  The patient has had good relief from her symptoms of swelling and discomfort the use of compression stockings.  Therefore, I have  Recommended that we continue with compession stockings.  She will follow-up in one year with an ultrasound to evaluate  her left external iliac vein stent  V. Leia Alf, M.D. [facility] Office: [phone] Pager:  [phone]

## 2014-05-12 NOTE — Addendum Note (Signed)
Addended by: Mena Goes on: 05/12/2014 04:35 PM   Modules accepted: Orders

## 2014-05-23 ENCOUNTER — Ambulatory Visit (INDEPENDENT_AMBULATORY_CARE_PROVIDER_SITE_OTHER): Payer: BLUE CROSS/BLUE SHIELD | Admitting: Emergency Medicine

## 2014-05-23 VITALS — BP 126/88 | HR 100 | Temp 98.6°F | Resp 18 | Ht 62.0 in | Wt 240.0 lb

## 2014-05-23 DIAGNOSIS — E118 Type 2 diabetes mellitus with unspecified complications: Secondary | ICD-10-CM

## 2014-05-23 DIAGNOSIS — B349 Viral infection, unspecified: Secondary | ICD-10-CM

## 2014-05-23 LAB — POCT CBC
Granulocyte percent: 48.8 %G (ref 37–80)
HCT, POC: 39.1 % (ref 37.7–47.9)
HEMOGLOBIN: 12.7 g/dL (ref 12.2–16.2)
LYMPH, POC: 0.9 (ref 0.6–3.4)
MCH, POC: 27.5 pg (ref 27–31.2)
MCHC: 32.4 g/dL (ref 31.8–35.4)
MCV: 84.8 fL (ref 80–97)
MID (cbc): 0.4 (ref 0–0.9)
MPV: 8.7 fL (ref 0–99.8)
PLATELET COUNT, POC: 208 10*3/uL (ref 142–424)
POC Granulocyte: 1.2 — AB (ref 2–6.9)
POC LYMPH PERCENT: 34.4 %L (ref 10–50)
POC MID %: 16.8 % — AB (ref 0–12)
RBC: 4.61 M/uL (ref 4.04–5.48)
RDW, POC: 14.6 %
WBC: 2.5 10*3/uL — AB (ref 4.6–10.2)

## 2014-05-23 LAB — GLUCOSE, POCT (MANUAL RESULT ENTRY): POC Glucose: 201 mg/dl — AB (ref 70–99)

## 2014-05-23 LAB — POCT GLYCOSYLATED HEMOGLOBIN (HGB A1C): HEMOGLOBIN A1C: 8.1

## 2014-05-23 NOTE — Patient Instructions (Signed)

## 2014-05-23 NOTE — Progress Notes (Signed)
[facility] [address] [place] [address] [phone]  Date:  05/23/2014   Name:  [name]   DOB:  [birth year]   MRN:  [id number]  PCP:  Chrisandra Netters, MD    Chief Complaint: Headache; Chills; Otalgia; and Fatigue   History of Present Illness:  [name] is a 44 y.o. 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 Yesterday developed a headache and malaise and is concerned she may have picked up a germ or something. Feels as though she has a fever and chills Myalgias and arthralgias Pain in both ears. No cough or coryza No sore throat.   No nausea or vomiting.  No stool change. No rash. No improvement with over the counter medications or other home remedies.  Denies other complaint or health concern today.   Patient Active Problem List   Diagnosis Date Noted  . Tightness of leg fascia-Left  03/19/2014  . Postoperative dehiscence of skin wound 01/31/2014  . Birthmark of skin 12/30/2013  . Skin tag 12/30/2013  . Chronic back pain 12/30/2013  . Peripheral vascular disease, unspecified 12/02/2013  . Pain of right sacroiliac joint 12/17/2012  . Occlusion and stenosis of carotid artery without mention of cerebral infarction 10/29/2012  . H/O hysterectomy for benign disease-ovaries not removed 10/03/2011  . Pes planus 09/05/2011  . Plantar fasciitis of left foot 08/22/2011  . Atherosclerosis of native arteries of the extremities with intermittent claudication 04/27/2011  . CKD (chronic kidney disease) stage 2, GFR 60-89 ml/min 03/16/2011  . Chest tightness 02/14/2011  . Costochondritis 02/10/2011  . Diabetes mellitus 06/02/2010  . PELVIC PAIN, CHRONIC 03/17/2010  . HEMORRHOIDS, INTERNAL 06/04/2009  . OBESITY, NOS 06/22/2006  . HYPERTENSION, BENIGN SYSTEMIC 06/22/2006  . GASTROESOPHAGEAL REFLUX, NO ESOPHAGITIS 06/22/2006    Past Medical History  Diagnosis Date  . Hypertension   .  Allergy     seasonal  . Obesity   . Hemorrhoids   . Diabetes mellitus   . Abnormal Pap smear   . Plantar fasciitis   . Atrial fibrillation     Past Surgical History  Procedure Laterality Date  . Abdominal hysterectomy  2005    for Fibroids  . Iliac vein angioplasty / stenting    . Cesarean section    . Tubal ligation    . Skin tag removal  Sept. 25, 2015    Right uppper arm  . Venogram N/A 11/07/2012    Procedure: VENOGRAM;  Surgeon: Serafina Mitchell, MD;  Location: [facility] CATH LAB;  Service: Cardiovascular;  Laterality: N/A;  . Venogram N/A 04/15/2014    Procedure: VENOGRAM;  Surgeon: Serafina Mitchell, MD;  Location: [facility] CATH LAB;  Service: Cardiovascular;  Laterality: N/A;    History  Substance Use Topics  . Smoking status: Never Smoker   . Smokeless tobacco: Never Used  . Alcohol Use: No    Family History  Problem Relation Age of Onset  . Diabetes Mother   . Hypertension Mother   . Hyperlipidemia Mother   . Heart disease Sister     [employer]  . Heart attack Neg Hx   . Sudden death Neg Hx   . Hypertension Other     Allergies  Allergen Reactions  . Other     Pt. States every time she is "put to sleep" she because becomes nauseated and runs a fever"  . Tape Rash    Medication list  has been reviewed and updated.  Current Outpatient Prescriptions on File Prior to Visit  Medication Sig Dispense Refill  . amLODipine (NORVASC) 10 MG tablet Take 1 tablet (10 mg total) by mouth daily. 30 tablet 3  . aspirin 81 MG EC tablet Take 81 mg by mouth daily.     Marland Kitchen atorvastatin (LIPITOR) 40 MG tablet Take 1 tablet (40 mg total) by mouth daily. 90 tablet 3  . fluticasone (FLONASE) 50 MCG/ACT nasal spray Place 2 sprays into both nostrils daily. 16 g 2  . glimepiride (AMARYL) 1 MG tablet Take 1 tablet (1 mg total) by mouth daily with breakfast. 30 tablet 3  . glucose blood (ONE TOUCH ULTRA TEST) test strip 1 each by Other route daily as needed for other (symptoms of hypo-  or hyperglycemia). Use as instructed 100 each 12  . Glucose Blood KIT Test blood sugar daily- fasting Dx 250.00 1 kit 0  . hydrochlorothiazide (HYDRODIURIL) 25 MG tablet Take 1 tablet (25 mg total) by mouth daily. 90 tablet 3  . Lancet Devices (AUTO-LANCETS) MISC Test blood sugar as directed  Dx 250.00 100 each 12  . lisinopril (PRINIVIL,ZESTRIL) 40 MG tablet Take 1 tablet (40 mg total) by mouth daily. 90 tablet 0  . metFORMIN (GLUCOPHAGE-XR) 500 MG 24 hr tablet Take 2 tablets (1,000 mg total) by mouth daily with breakfast. 60 tablet 2  . omeprazole (PRILOSEC) 20 MG capsule Take 1 capsule (20 mg total) by mouth daily. 30 capsule 3  . ranitidine (ZANTAC) 150 MG tablet Take 1 tablet (150 mg total) by mouth 2 (two) times daily. 60 tablet 2  . acetaminophen (TYLENOL) 650 MG CR tablet Take 650 mg by mouth every 8 (eight) hours as needed for pain.    . cyclobenzaprine (FLEXERIL) 5 MG tablet Take 1 tablet (5 mg total) by mouth daily as needed for muscle spasms. (Patient not taking: Reported on 05/23/2014) 30 tablet 0   No current facility-administered medications on file prior to visit.    Review of Systems:  As per HPI, otherwise negative.    Physical Examination: Filed Vitals:   05/23/14 1343  BP: 126/88  Pulse: 100  Temp: 98.6 F (37 C)  Resp: 18   Filed Vitals:   05/23/14 1343  Height: 5' 2" (1.575 m)  Weight: 240 lb (108.863 kg)   Body mass index is 43.89 kg/(m^2). Ideal Body Weight: Weight in (lb) to have BMI = 25: 136.4  GEN: obese, NAD, Non-toxic, A & O x 3 HEENT: Atraumatic, Normocephalic. Neck supple. No masses, No LAD. Ears and Nose: No external deformity. CV: RRR, No M/G/R. No JVD. No thrill. No extra heart sounds. PULM: CTA B, no wheezes, crackles, rhonchi. No retractions. No resp. distress. No accessory muscle use. ABD: S, NT, ND, +BS. No rebound. No HSM. EXTR: No c/c/e NEURO Normal gait.  PSYCH: Normally interactive. Conversant. Not depressed or anxious appearing.   Calm demeanor.    Assessment and Plan: Viral syndrome Tylenol Fluids   Signed,  Ellison Carwin, MD   Results for orders placed or performed in visit on 05/23/14  POCT CBC  Result Value Ref Range   WBC 2.5 (A) 4.6 - 10.2 K/uL   Lymph, poc 0.9 0.6 - 3.4   POC LYMPH PERCENT 34.4 10 - 50 %L   MID (cbc) 0.4 0 - 0.9   POC MID % 16.8 (A) 0 - 12 %M   POC Granulocyte 1.2 (A) 2 - 6.9   Granulocyte percent 48.8 37 -  80 %G   RBC 4.61 4.04 - 5.48 M/uL   Hemoglobin 12.7 12.2 - 16.2 g/dL   HCT, POC 39.1 37.7 - 47.9 %   MCV 84.8 80 - 97 fL   MCH, POC 27.5 27 - 31.2 pg   MCHC 32.4 31.8 - 35.4 g/dL   RDW, POC 14.6 %   Platelet Count, POC 208 142 - 424 K/uL   MPV 8.7 0 - 99.8 fL  POCT glycosylated hemoglobin (Hb A1C)  Result Value Ref Range   Hemoglobin A1C 8.1   POCT glucose (manual entry)  Result Value Ref Range   POC Glucose 201 (A) 70 - 99 mg/dl

## 2014-06-17 ENCOUNTER — Other Ambulatory Visit: Payer: Self-pay | Admitting: *Deleted

## 2014-06-17 DIAGNOSIS — E118 Type 2 diabetes mellitus with unspecified complications: Secondary | ICD-10-CM

## 2014-06-19 MED ORDER — GLIMEPIRIDE 1 MG PO TABS: 1.0000 mg | ORAL_TABLET | Freq: Every day | ORAL | Status: DC

## 2014-06-19 MED ORDER — METFORMIN HCL ER 500 MG PO TB24: 1000.0000 mg | ORAL_TABLET | Freq: Every day | ORAL | Status: DC

## 2014-06-19 MED ORDER — OMEPRAZOLE 20 MG PO CPDR
20.0000 mg | DELAYED_RELEASE_CAPSULE | Freq: Every day | ORAL | Status: DC
Start: 2014-06-19 — End: 2015-01-27

## 2014-08-29 ENCOUNTER — Other Ambulatory Visit: Payer: Self-pay | Admitting: *Deleted

## 2014-08-29 DIAGNOSIS — I10 Essential (primary) hypertension: Secondary | ICD-10-CM

## 2014-08-29 MED ORDER — AMLODIPINE BESYLATE 10 MG PO TABS: 10.0000 mg | ORAL_TABLET | Freq: Every day | ORAL | Status: DC

## 2014-09-29 ENCOUNTER — Other Ambulatory Visit: Payer: Self-pay | Admitting: *Deleted

## 2014-09-29 MED ORDER — RANITIDINE HCL 150 MG PO TABS: 150.0000 mg | ORAL_TABLET | Freq: Two times a day (BID) | ORAL | Status: DC

## 2014-11-03 ENCOUNTER — Other Ambulatory Visit: Payer: Self-pay | Admitting: *Deleted

## 2014-11-03 ENCOUNTER — Telehealth: Payer: Self-pay | Admitting: Family Medicine

## 2014-11-03 DIAGNOSIS — I10 Essential (primary) hypertension: Secondary | ICD-10-CM

## 2014-11-03 MED ORDER — GLIMEPIRIDE 1 MG PO TABS: 1.0000 mg | ORAL_TABLET | Freq: Every day | ORAL | Status: DC

## 2014-11-03 MED ORDER — AMLODIPINE BESYLATE 10 MG PO TABS: 10.0000 mg | ORAL_TABLET | Freq: Every day | ORAL | Status: DC

## 2014-11-03 NOTE — Telephone Encounter (Signed)
Left message on voicemail with message from PCP.

## 2014-11-03 NOTE — Telephone Encounter (Signed)
To [facility] red team - please call pt and let her know I have refilled her medicine but she needs to schedule an appointment to follow up on her BP and diabetes. Thanks!  Leeanne Rio, MD

## 2014-11-12 ENCOUNTER — Encounter: Payer: Self-pay | Admitting: Family Medicine

## 2014-11-12 ENCOUNTER — Ambulatory Visit (INDEPENDENT_AMBULATORY_CARE_PROVIDER_SITE_OTHER): Payer: BLUE CROSS/BLUE SHIELD | Admitting: Family Medicine

## 2014-11-12 VITALS — BP 146/106 | HR 91 | Temp 98.5°F | Ht 62.0 in | Wt 242.0 lb

## 2014-11-12 DIAGNOSIS — I10 Essential (primary) hypertension: Secondary | ICD-10-CM | POA: Diagnosis not present

## 2014-11-12 DIAGNOSIS — E118 Type 2 diabetes mellitus with unspecified complications: Secondary | ICD-10-CM

## 2014-11-12 DIAGNOSIS — M2142 Flat foot [pes planus] (acquired), left foot: Secondary | ICD-10-CM

## 2014-11-12 LAB — BASIC METABOLIC PANEL
BUN: 10 mg/dL (ref 6–23)
CO2: 29 mEq/L (ref 19–32)
Calcium: 9.4 mg/dL (ref 8.4–10.5)
Chloride: 103 mEq/L (ref 96–112)
Creat: 0.86 mg/dL (ref 0.50–1.10)
Glucose, Bld: 105 mg/dL — ABNORMAL HIGH (ref 70–99)
Potassium: 3.6 mEq/L (ref 3.5–5.3)
SODIUM: 140 meq/L (ref 135–145)

## 2014-11-12 LAB — POCT GLYCOSYLATED HEMOGLOBIN (HGB A1C): HEMOGLOBIN A1C: 8.5

## 2014-11-12 MED ORDER — AMLODIPINE BESYLATE 10 MG PO TABS: 10.0000 mg | ORAL_TABLET | Freq: Every day | ORAL | Status: DC

## 2014-11-12 MED ORDER — LISINOPRIL 40 MG PO TABS: 40.0000 mg | ORAL_TABLET | Freq: Every day | ORAL | Status: DC

## 2014-11-12 MED ORDER — GLIMEPIRIDE 1 MG PO TABS: 1.0000 mg | ORAL_TABLET | Freq: Every day | ORAL | Status: DC

## 2014-11-12 NOTE — Assessment & Plan Note (Signed)
Currently above goal Attempt at diet and exercise modifications Continue current medication regimen of amlodipine 10 mg daily, HCTZ 12.5 mg daily, lisinopril 40 mg daily Bmet today Follow-up in one month with PCP and consider increasing antihypertensives

## 2014-11-12 NOTE — Assessment & Plan Note (Signed)
Likely that the pain is related to has planus when wearing unsupportive shoes Discussed with patient the importance of good arch support

## 2014-11-12 NOTE — Progress Notes (Signed)
44 y.o. year old female presents for well woman/preventative visit and annual GYN examination.  Acute Concerns: L foot and ankle swelling and tenderness: - started within last few weeks - wears sneakers a lot, but has been wearing ballet flats more recently - getting better after going back to sneakers - skin between toes peels a lot on L foot - no itching after using cortisone cream  HTN: - takes amlodipine 10mg  daily, lisinopril 40mg  daily and HCTZ 12.5mg  daily - thinks she urinates too much to tolerate 25mg  of HCTZ - reports good compliance with medications - missed 1-2 doses in last month - denies CP, SOB, HAs, vision changes, LE edema except as above - no symptoms of hypotension  T2DM: - taking metformin XR 1000mg  daily, glimepiride 1mg  daily - runs 140s-160s at home in the AM - UTD on foot and eye exam  Diet:  - admits that it is not what it should be - thinks she eats a lot more sodium than she should - trying to cut back  Exercise: walks a lot at work and home - doesn't take elevator - takes a lot of stairs - wants to walk more when able to 2/2 foot pain  Substances: denies alcohol, illicit drugs, tobacco (never smoker)  Sexual/Birth History:  Not sexally active, G1P! - duaghter is 44y/o  Birth Control: BTL and partial hysterectomy  Social:  History   Social History  . Marital Status: Single    Spouse Name: N/A  . Number of Children: N/A  . Years of Education: N/A   Social History Main Topics  . Smoking status: Never Smoker   . Smokeless tobacco: Never Used  . Alcohol Use: No  . Drug Use: No  . Sexual Activity: Not on file   Other Topics Concern  . None   Social History Narrative    Immunization:  Tdap/TD: 4/13  Influenza: n/a - revisit in fall  Pneumococcal: 10/15  Herpes Zoster: n/a  Cancer Screening:  Pap Smear: last in 2013  Mammogram: 12/15  Colonoscopy: n/a  Dexa: n/a  Physical Exam: VITALS: Filed Vitals:   11/12/14 1519  BP:  160/106  Pulse: 91  Temp: 98.5 F (36.9 C)    GEN: NAD, appears stated age HEENT: NCAT, MMM, PERRL, EOMI, OP clear, anicteric sclera, TMs clear CARDIAC: RRR, no murmurs noted, intact distal pulses RESP: CTA B, no wheezes, comfortable work of breathing ABD: Soft, NT ND, normal bowel sounds, no rebound or guarding or organomegaly EXT: Warm and well-perfused, no edema SKIN: No rashes noted MSK: No swelling or tenderness in feet or ankles. Normal range of motion, strength intact  ASSESSMENT & PLAN: 44 y.o. female presents for annual well woman/preventative exam and GYN exam. Please see problem specific assessment and plan.    Virginia Crews, MD, MPH PGY-2,  [facility] 11/12/2014 3:34 PM

## 2014-11-12 NOTE — Patient Instructions (Signed)
Nice to meet you today. We'll get some lab work and someone will call you or send you a letter with the results when they're available. Please come back in one month to see your PCP for blood pressure follow-up. For now keep taking the same medications for diabetes and blood pressure. Try to work on diet and exercise likely talked about as well.   Take care, Dr. Jacinto Reap

## 2014-11-12 NOTE — Assessment & Plan Note (Signed)
A1c today.  Continue current medications. 

## 2014-11-13 ENCOUNTER — Telehealth: Payer: Self-pay | Admitting: Family Medicine

## 2014-11-13 DIAGNOSIS — I10 Essential (primary) hypertension: Secondary | ICD-10-CM

## 2014-11-13 MED ORDER — GLIMEPIRIDE 2 MG PO TABS: 2.0000 mg | ORAL_TABLET | Freq: Every day | ORAL | Status: DC

## 2014-11-13 NOTE — Telephone Encounter (Signed)
Called patient to discuss lab results from recent visit.  No answer, left voicemail.  Electrolytes and kidney function wnl.  A1c elevated to 8.5.  Suggest increasing glimepiride to 2mg  daily.  Will await discussion with patient before increasing medication.  Virginia Crews, MD, MPH PGY-2,  [facility] 11/13/2014 12:30 PM

## 2014-11-13 NOTE — Telephone Encounter (Signed)
Discussed results with patient.  Will increase glimepiride to 2mg  daily.  F/u with PCP in 1 month.  Virginia Crews, MD, MPH PGY-2,  [facility] 11/13/2014 4:09 PM

## 2014-12-01 ENCOUNTER — Other Ambulatory Visit: Payer: Self-pay | Admitting: Family Medicine

## 2014-12-08 ENCOUNTER — Ambulatory Visit: Payer: BC Managed Care – PPO | Admitting: Family

## 2014-12-08 ENCOUNTER — Other Ambulatory Visit (HOSPITAL_COMMUNITY): Payer: BC Managed Care – PPO

## 2015-01-02 ENCOUNTER — Encounter: Payer: Self-pay | Admitting: Family Medicine

## 2015-01-02 DIAGNOSIS — D573 Sickle-cell trait: Secondary | ICD-10-CM | POA: Insufficient documentation

## 2015-01-27 ENCOUNTER — Encounter: Payer: Self-pay | Admitting: Family Medicine

## 2015-01-27 ENCOUNTER — Ambulatory Visit (INDEPENDENT_AMBULATORY_CARE_PROVIDER_SITE_OTHER): Payer: BLUE CROSS/BLUE SHIELD | Admitting: Family Medicine

## 2015-01-27 VITALS — BP 138/78 | HR 92 | Temp 98.4°F | Resp 18 | Ht 61.5 in | Wt 240.0 lb

## 2015-01-27 DIAGNOSIS — Z23 Encounter for immunization: Secondary | ICD-10-CM | POA: Diagnosis not present

## 2015-01-27 DIAGNOSIS — N182 Chronic kidney disease, stage 2 (mild): Secondary | ICD-10-CM | POA: Diagnosis not present

## 2015-01-27 DIAGNOSIS — I10 Essential (primary) hypertension: Secondary | ICD-10-CM

## 2015-01-27 DIAGNOSIS — K219 Gastro-esophageal reflux disease without esophagitis: Secondary | ICD-10-CM | POA: Diagnosis not present

## 2015-01-27 DIAGNOSIS — E1122 Type 2 diabetes mellitus with diabetic chronic kidney disease: Secondary | ICD-10-CM

## 2015-01-27 DIAGNOSIS — K648 Other hemorrhoids: Secondary | ICD-10-CM | POA: Diagnosis not present

## 2015-01-27 DIAGNOSIS — K602 Anal fissure, unspecified: Secondary | ICD-10-CM | POA: Diagnosis not present

## 2015-01-27 DIAGNOSIS — E669 Obesity, unspecified: Secondary | ICD-10-CM

## 2015-01-27 DIAGNOSIS — B353 Tinea pedis: Secondary | ICD-10-CM

## 2015-01-27 MED ORDER — CLOTRIMAZOLE 1 % EX CREA: 1.0000 "application " | TOPICAL_CREAM | Freq: Two times a day (BID) | CUTANEOUS | Status: AC

## 2015-01-27 MED ORDER — GLIMEPIRIDE 2 MG PO TABS: 2.0000 mg | ORAL_TABLET | Freq: Every day | ORAL | Status: DC

## 2015-01-27 MED ORDER — OMEPRAZOLE 40 MG PO CPDR: 40.0000 mg | DELAYED_RELEASE_CAPSULE | Freq: Every day | ORAL | Status: DC

## 2015-01-27 MED ORDER — NITROGLYCERIN 2 % TD OINT: TOPICAL_OINTMENT | TRANSDERMAL | Status: DC

## 2015-01-27 NOTE — Assessment & Plan Note (Signed)
Diabetes is uncontrolled. It seems like they tried other medications but cost was a problem. I would increase her glimepiride to 2 mg she will continue metformin 1000 mg extended release we will have a low A1c done in 4 weeks. If her renal function is good she will be a good candidate for Janumet or Invokana

## 2015-01-27 NOTE — Assessment & Plan Note (Signed)
Increase Prilosec to 40 mg she will discontinue the Zantac which she is not taking on a regular basis anyway

## 2015-01-27 NOTE — Assessment & Plan Note (Signed)
Anal fissure and internal hemorroids, advised stool softners as needed Given diltizem gel, send to GI

## 2015-01-27 NOTE — Progress Notes (Signed)
Patient ID: [name], female   DOB: 1970-09-01, 44 y.o.   MRN: [id number]   Subjective:    Patient ID: [name], female    DOB: 14-Apr-1971, 44 y.o.   MRN: [id number]  Patient presents for new pt get estabhlished and OTHER  patient here to establish care. 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 Her medications and history were reviewed. She has history of diabetes mellitus diagnosed back in 2012 her last A1c was 8.5% in July of this year. She's had problems getting her medications therefore is only been taking metformin 1000 mg extended release in glimepiride 1 mg daily. She does not check her blood sugars she needs a new glucometer. She denies any polyuria or polydipsia but states she can feel it when her blood sugar does drop. She is not monitoring her diet very well. Ex  Hypertension she's had problems with her blood pressure fluctuation on and off. 1 review of her medication she has not been taking her hydrochlorothiazide every day because it makes her urinate too much.  She has history of peripheral vascular disease has had a stent placed she is being followed by vascular surgery she has some venous stasis problems as well which is why the diarrhetic does help.  History of chronic SI joint pain she gets manipulations done at a joint clinic which she pays out of pocket.  The past week or 2 she feels like she has a recurrent anal fissure. She has internal hemorrhoids that she is aware of but when she wiped she has severe pain and bright red blood she can feel it in one spot. She ran out of the previous cream that she had an she will like to be referred back to gastroenterology for problems with her bowels.  On exam she was found to have significant peeling and redness on the lateral aspect of her foot she states that she received a pedicure when he put her foot in hot wax afterwards her foot broke out she's been using a and D ointment and cocoa butter which has  helped significantly.  Reflux- takes prilosec which helps better than zantac but still doesn't get all of her symptoms  Review Of Systems:  GEN- denies fatigue, fever, weight loss,weakness, recent illness HEENT- denies eye drainage, change in vision, nasal discharge, CVS- denies chest pain, palpitations RESP- denies SOB, cough, wheeze ABD- denies N/V, change in stools, abd pain GU- denies dysuria, hematuria, dribbling, incontinence MSK- + joint pain, muscle aches, injury Neuro- denies headache, dizziness, syncope, seizure activity       Objective:    BP 138/78 mmHg  Pulse 92  Temp(Src) 98.4 F (36.9 C) (Oral)  Resp 18  Ht 5' 1.5" (1.562 m)  Wt 240 lb (108.863 kg)  BMI 44.62 kg/m2 GEN- NAD, alert and oriented x3 HEENT- PERRL, EOMI, non injected sclera, pink conjunctiva, MMM, oropharynx clear Neck- Supple, no thyromegaly CVS- RRR, no murmur RESP-CTAB ABD-NABS,soft,NT,ND Retum- external tags, very small fissure at 12 o clock position, internal hemorroids palpated, no gross blood  EXT- No edema Skin- peeling skin lateral edge of Left foot, mild maceration between toes, mild erythema  Pulses- Radial, DP- 2+        Assessment & Plan:      Problem List Items Addressed This Visit    OBESITY, NOS   Relevant Medications   glimepiride (AMARYL) 2 MG tablet   Internal hemorrhoids    Anal fissure and internal hemorroids,  advised stool softners as needed Given diltizem gel, send to GI       Relevant Medications   nitroGLYCERIN (NITROGLYN) 2 % ointment   HYPERTENSION, BENIGN SYSTEMIC - Primary (Chronic)    Discussed her medications in detail. She will continue her blood pressure medicines and she will take one half tablet of hydrochlorothiazide daily hopefully to increase her compliance with this medication. Her blood pressure looks okay today. Will have fasting labs at her follow-up visit      Relevant Medications   glimepiride (AMARYL) 2 MG tablet   nitroGLYCERIN  (NITROGLYN) 2 % ointment   GASTROESOPHAGEAL REFLUX, NO ESOPHAGITIS (Chronic)    Increase Prilosec to 40 mg she will discontinue the Zantac which she is not taking on a regular basis anyway      Relevant Medications   omeprazole (PRILOSEC) 40 MG capsule   Diabetes mellitus (HCC) (Chronic)    Diabetes is uncontrolled. It seems like they tried other medications but cost was a problem. I would increase her glimepiride to 2 mg she will continue metformin 1000 mg extended release we will have a low A1c done in 4 weeks. 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 Any transcriptional errors that result from this process are unintentional.

## 2015-01-27 NOTE — Patient Instructions (Addendum)
Use the clotrimazole twice a day to foot  Prilosec increased to 40mg  once a day  Flu shot given  Glimperide increased to 2mg  ( 1 tablet daily) Continue metformin Referral to GI Topical to rectal area twice a day  Use stool softner F/U 4 weeks- FASTING

## 2015-01-27 NOTE — Assessment & Plan Note (Signed)
Discussed her medications in detail. She will continue her blood pressure medicines and she will take one half tablet of hydrochlorothiazide daily hopefully to increase her compliance with this medication. Her blood pressure looks okay today. Will have fasting labs at her follow-up visit

## 2015-01-28 ENCOUNTER — Other Ambulatory Visit: Payer: Self-pay | Admitting: *Deleted

## 2015-01-28 DIAGNOSIS — E119 Type 2 diabetes mellitus without complications: Secondary | ICD-10-CM

## 2015-01-28 MED ORDER — BLOOD GLUCOSE METER KIT: PACK | Status: AC

## 2015-01-28 MED ORDER — GLUCOSE BLOOD VI STRP: ORAL_STRIP | Status: DC

## 2015-01-28 MED ORDER — LANCETS MISC. KIT: PACK | Status: DC

## 2015-01-28 MED ORDER — LANCETS MISC: Status: DC

## 2015-02-05 ENCOUNTER — Encounter: Payer: Self-pay | Admitting: Gastroenterology

## 2015-02-27 ENCOUNTER — Encounter: Payer: Self-pay | Admitting: Family Medicine

## 2015-02-27 ENCOUNTER — Ambulatory Visit (INDEPENDENT_AMBULATORY_CARE_PROVIDER_SITE_OTHER): Payer: BLUE CROSS/BLUE SHIELD | Admitting: Family Medicine

## 2015-02-27 VITALS — BP 132/76 | HR 80 | Temp 98.3°F | Resp 14 | Ht 62.0 in | Wt 241.0 lb

## 2015-02-27 DIAGNOSIS — E669 Obesity, unspecified: Secondary | ICD-10-CM

## 2015-02-27 DIAGNOSIS — M722 Plantar fascial fibromatosis: Secondary | ICD-10-CM

## 2015-02-27 DIAGNOSIS — E1122 Type 2 diabetes mellitus with diabetic chronic kidney disease: Secondary | ICD-10-CM

## 2015-02-27 DIAGNOSIS — E785 Hyperlipidemia, unspecified: Secondary | ICD-10-CM

## 2015-02-27 DIAGNOSIS — N182 Chronic kidney disease, stage 2 (mild): Secondary | ICD-10-CM

## 2015-02-27 DIAGNOSIS — I10 Essential (primary) hypertension: Secondary | ICD-10-CM | POA: Diagnosis not present

## 2015-02-27 LAB — COMPREHENSIVE METABOLIC PANEL
ALBUMIN: 3.7 g/dL (ref 3.6–5.1)
ALT: 14 U/L (ref 6–29)
AST: 12 U/L (ref 10–35)
Alkaline Phosphatase: 97 U/L (ref 33–115)
BILIRUBIN TOTAL: 0.4 mg/dL (ref 0.2–1.2)
BUN: 13 mg/dL (ref 7–25)
CHLORIDE: 102 mmol/L (ref 98–110)
CO2: 28 mmol/L (ref 20–31)
CREATININE: 0.91 mg/dL (ref 0.50–1.10)
Calcium: 9.4 mg/dL (ref 8.6–10.2)
Glucose, Bld: 145 mg/dL — ABNORMAL HIGH (ref 70–99)
Potassium: 4.8 mmol/L (ref 3.5–5.3)
SODIUM: 136 mmol/L (ref 135–146)
TOTAL PROTEIN: 6.9 g/dL (ref 6.1–8.1)

## 2015-02-27 LAB — LIPID PANEL
Cholesterol: 122 mg/dL — ABNORMAL LOW (ref 125–200)
HDL: 33 mg/dL — AB (ref >=46)
LDL CALC: 77 mg/dL (ref <130)
Total CHOL/HDL Ratio: 3.7 Ratio (ref <=5.0)
Triglycerides: 58 mg/dL (ref <150)
VLDL: 12 mg/dL (ref <30)

## 2015-02-27 LAB — CBC WITH DIFFERENTIAL/PLATELET
BASOS ABS: 0 10*3/uL (ref 0.0–0.1)
BASOS PCT: 0 % (ref 0–1)
EOS ABS: 0.2 10*3/uL (ref 0.0–0.7)
Eosinophils Relative: 3 % (ref 0–5)
HCT: 41.8 % (ref 36.0–46.0)
HEMOGLOBIN: 14 g/dL (ref 12.0–15.0)
Lymphocytes Relative: 37 % (ref 12–46)
Lymphs Abs: 2.4 10*3/uL (ref 0.7–4.0)
MCH: 27.4 pg (ref 26.0–34.0)
MCHC: 33.5 g/dL (ref 30.0–36.0)
MCV: 81.8 fL (ref 78.0–100.0)
MONOS PCT: 7 % (ref 3–12)
MPV: 10.6 fL (ref 8.6–12.4)
Monocytes Absolute: 0.5 10*3/uL (ref 0.1–1.0)
NEUTROS ABS: 3.4 10*3/uL (ref 1.7–7.7)
NEUTROS PCT: 53 % (ref 43–77)
PLATELETS: 313 10*3/uL (ref 150–400)
RBC: 5.11 MIL/uL (ref 3.87–5.11)
RDW: 13.3 % (ref 11.5–15.5)
WBC: 6.5 10*3/uL (ref 4.0–10.5)

## 2015-02-27 LAB — HEMOGLOBIN A1C
HEMOGLOBIN A1C: 7.8 % — AB (ref <5.7)
MEAN PLASMA GLUCOSE: 177 mg/dL — AB (ref <117)

## 2015-02-27 NOTE — Assessment & Plan Note (Signed)
Refer to [facility] for re-evaluation she may need custom orthotics, has tried conservative  therapy for > 1 year

## 2015-02-27 NOTE — Progress Notes (Signed)
Patient ID: [name], female   DOB: 1970/10/20, 44 y.o.   MRN: [id number]   Subjective:    Patient ID: [name], female    DOB: [birth year], 44 y.o.   MRN: [id number]  Patient presents for F/U  Pt here for intermin f/u, DM- glimperide is at 2mg  along with MTF, her CBG recently , 150 fasting, no hypoglycemia  Chronic plantar fasciitis- continues to have pain, using a band on foot, has had steroid injection by podiatry in the past, uses OTC inserts with minimal improvement, states she can not exercise because of the pain  GERD- improved with prilosec 40mg       Review Of Systems:  GEN- denies fatigue, fever, weight loss,weakness, recent illness HEENT- denies eye drainage, change in vision, nasal discharge, CVS- denies chest pain, palpitations RESP- denies SOB, cough, wheeze ABD- denies N/V, change in stools, abd pain GU- denies dysuria, hematuria, dribbling, incontinence MSK-+joint pain, muscle aches, injury Neuro- denies headache, dizziness, syncope, seizure activity       Objective:    BP 132/76 mmHg  Pulse 80  Temp(Src) 98.3 F (36.8 C) (Oral)  Resp 14  Ht 5\' 2"  (1.575 m)  Wt 241 lb (109.317 kg)  BMI 44.07 kg/m2 GEN- NAD, alert and oriented x3 CVS- RRR, no murmur RESP-CTAB EXT- No edema Pulses- Radial, DP- 2+        Assessment & Plan:      Problem List Items Addressed This Visit    OBESITY, NOS   HYPERTENSION, BENIGN SYSTEMIC (Chronic)   Relevant Orders   CBC with Differential/Platelet   Comprehensive metabolic panel   Hyperlipidemia   Relevant Orders   Lipid panel   Diabetes mellitus (HCC) (Chronic)   Relevant Orders   Hemoglobin A1c   CKD (chronic kidney disease) stage 2, GFR 60-89 ml/min - Primary (Chronic)      Note: This dictation was prepared with Dragon dictation along with smaller phrase technology. Any transcriptional errors that result from this process are unintentional.

## 2015-02-27 NOTE — Assessment & Plan Note (Signed)
Recheck A1C goal is < 7%, fasting labs today. Discussed healthy eating, and importance of nutrition with all of her comorbities. She will plan to start with giving up chocolate candy bars, starting next week

## 2015-02-27 NOTE — Patient Instructions (Signed)
Continue current medications We will call with lab results Referral for custom orthotics  F/U 3 months

## 2015-02-27 NOTE — Assessment & Plan Note (Signed)
Controlled no ACEI

## 2015-02-28 LAB — MICROALBUMIN / CREATININE URINE RATIO
CREATININE, URINE: 94 mg/dL (ref 20–320)
MICROALB UR: 0.3 mg/dL
Microalb Creat Ratio: 3 mcg/mg creat (ref <30)

## 2015-03-27 ENCOUNTER — Other Ambulatory Visit (INDEPENDENT_AMBULATORY_CARE_PROVIDER_SITE_OTHER): Payer: BLUE CROSS/BLUE SHIELD

## 2015-03-27 ENCOUNTER — Encounter: Payer: Self-pay | Admitting: Family Medicine

## 2015-03-27 ENCOUNTER — Ambulatory Visit (INDEPENDENT_AMBULATORY_CARE_PROVIDER_SITE_OTHER): Payer: BLUE CROSS/BLUE SHIELD | Admitting: Family Medicine

## 2015-03-27 VITALS — BP 134/88 | HR 89 | Ht 62.0 in | Wt 244.0 lb

## 2015-03-27 DIAGNOSIS — M79672 Pain in left foot: Secondary | ICD-10-CM | POA: Diagnosis not present

## 2015-03-27 DIAGNOSIS — M722 Plantar fascial fibromatosis: Secondary | ICD-10-CM | POA: Insufficient documentation

## 2015-03-27 DIAGNOSIS — M6789 Other specified disorders of synovium and tendon, multiple sites: Secondary | ICD-10-CM

## 2015-03-27 DIAGNOSIS — M66879 Spontaneous rupture of other tendons, unspecified ankle and foot: Secondary | ICD-10-CM | POA: Insufficient documentation

## 2015-03-27 DIAGNOSIS — M66872 Spontaneous rupture of other tendons, left ankle and foot: Secondary | ICD-10-CM | POA: Diagnosis not present

## 2015-03-27 DIAGNOSIS — M76829 Posterior tibial tendinitis, unspecified leg: Secondary | ICD-10-CM | POA: Insufficient documentation

## 2015-03-27 MED ORDER — DICLOFENAC SODIUM 2 % TD SOLN: TRANSDERMAL | Status: DC

## 2015-03-27 NOTE — Progress Notes (Signed)
Pre visit review using our clinic review tool, if applicable. No additional management support is needed unless otherwise documented below in the visit note. 

## 2015-03-27 NOTE — Progress Notes (Signed)
[name] [address], [place] [address] Phone: [phone] Subjective:    I'm seeing this patient by the request  of:  Vic Blackbird, MD   CC: Left foot pain  [id number] [name] is a 44 y.o. female coming in with complaint of left foot pain. Patient has had this pain for multiple months if not longer. Patient did have an exacerbation previously of a plantar fasciitis. Did resolve multiple years ago after a steroid injections. Patient states that this feels very similar. Seems to be worse with the first steps in the morning and after sitting a long amount of time. Patient though has noticed that she had some improvement with wearing the heel. Patient has tried some over-the-counter anti-inflammatories with moderate improvement. Not stopping her from any activities but is aggravating. Seems to be worsening over the course last several weeks. Denies any numbness. Patient has had some blood flow problems of this leg previously. Denies that this seems anything same. Denies fever, chills, or any abnormal weight loss. Denies any discoloration of the foot.  Past Medical History  Diagnosis Date  . Allergy     seasonal  . Obesity   . Hemorrhoids   . Abnormal Pap smear   . Plantar fasciitis   . Atrial fibrillation ([place])   . Diabetes mellitus 2012  . GERD (gastroesophageal reflux disease)   . Hypertension 1995  . Hyperlipidemia 2015  . Sickle cell trait ([place]) 1995   Past Surgical History  Procedure Laterality Date  . Abdominal hysterectomy  2005    for Fibroids  . Iliac vein angioplasty / stenting    . Cesarean section    . Tubal ligation    . Skin tag removal  Sept. 25, 2015    Right uppper arm  . Venogram N/A 11/07/2012    Procedure: VENOGRAM;  Surgeon: Serafina Mitchell, MD;  Location: [facility] CATH LAB;  Service: Cardiovascular;  Laterality: N/A;  . Venogram N/A 04/15/2014    Procedure: VENOGRAM;  Surgeon: Serafina Mitchell, MD;  Location: [facility]  CATH LAB;  Service: Cardiovascular;  Laterality: N/A;   Family History  Problem Relation Age of Onset  . Diabetes Mother   . Hypertension Mother   . Hyperlipidemia Mother   . Heart disease Sister     [place]  . Hearing loss Sister   . Learning disabilities Sister   . Heart attack Neg Hx   . Sudden death Neg Hx   . Hypertension Other   . Learning disabilities Daughter   . Asthma Maternal Aunt   . Diabetes Maternal Aunt   . Hyperlipidemia Maternal Aunt   . Hypertension Maternal Aunt   . Arthritis Maternal Grandmother   . Stroke Maternal Grandmother    Social History  Substance Use Topics  . Smoking status: Never Smoker   . Smokeless tobacco: Never Used  . Alcohol Use: No   Allergies  Allergen Reactions  . Other     Pt. States every time she is "put to sleep" she because becomes nauseated and runs a fever"  . Tape Rash        Past medical history, social, surgical and family history all reviewed in electronic medical record.   Review of Systems: No headache, visual changes, nausea, vomiting, diarrhea, constipation, dizziness, abdominal pain, skin rash, fevers, chills, night sweats, weight loss, swollen lymph nodes, body aches, joint swelling, muscle aches, chest pain, shortness of breath, mood changes.   Objective Blood  pressure 134/88, pulse 89, height 5\' 2"  (1.575 m), weight 244 lb (110.678 kg), SpO2 99 %.  General: No apparent distress alert and oriented x3 mood and affect normal, dressed appropriately.  HEENT: Pupils equal, extraocular movements intact  Respiratory: Patient's speak in full sentences and does not appear short of breath  Cardiovascular: No lower extremity edema, non tender, no erythema  Skin: Warm dry intact with no signs of infection or rash on extremities or on axial skeleton.  Abdomen: Soft nontender  Neuro: Cranial nerves II through XII are intact, neurovascularly intact in all extremities with 2+ DTRs and 2+ pulses.  Lymph: No  lymphadenopathy of posterior or anterior cervical chain or axillae bilaterally.  Gait normal with good balance and coordination.  MSK:  Non tender with full range of motion and good stability and symmetric strength and tone of shoulders, elbows, wrist, hip, knee and ankles bilaterally.  Foot exam shows patient does have significant pes planus bilaterally. Patient does have severe over pronation of the left foot compared to the right foot especially the hindfoot and midfoot. Patient is tender to palpation over the insertion of the posterior tibialis. Negative pain to percussion over the navicular bone. Patient does have pain around the inferior and posterior aspect of the lateral malleolus over the posterior tib. Patient when attempting to stand has no supination on her toes. Neurovascular intact distally. Full range of motion of the ankle. Contralateral foot as stated above with mild breakdown of the transverse and longitudinal arch.  MSK US performed of: Right ankle This study was ordered, performed, and interpreted by Charlann Boxer D.O.  Foot/Ankle:   All structures visualized.   Talar dome unremarkable  Ankle mortise without effusion. Peroneus longus and brevis tendons unremarkable on long and transverse views without sheath effusions. Posterior tibialis tendon does have a large tear with increasing vascularization and hypoechoic changes. Seems to be more of a partial tear. Proximally 50% of the tendon. Mostly just 1 cm proximal to the lateral malleolus. Hypoechoic changes going inferior to the lateral malleolus. No avulsion noted at the navicular. Achilles tendon visualized along length of tendon and unremarkable on long and transverse views without sheath effusion. Anterior Talofibular Ligament and Calcaneofibular Ligaments unremarkable and intact. Deltoid Ligament unremarkable and intact. Plantar fascia intact and is normal size measuring 0.79 cm. Patient does have a fibroma  noted.   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  Procedure note [id number]; 15 minutes spent for Therapeutic exercises as stated in above notes.  This included exercises focusing on stretching, strengthening, with significant focus on eccentric aspects. Ankle strengthening that included:  Basic range of motion exercises to allow proper full motion at ankle Stretching of the lower leg and hamstrings  Theraband exercises for the lower leg - inversion, eversion, dorsiflexion and plantarflexion each to be completed with a theraband Balance exercises to increase proprioception Weight bearing exercises to increase strength and balance  Proper technique shown and discussed handout in great detail with ATC.  All questions were discussed and answered.       Impression and Recommendations:     This case required medical decision making of moderate complexity.

## 2015-03-27 NOTE — Assessment & Plan Note (Signed)
Patient does have insufficiency as well as a posterior tibialis tendon tear. This is going to take some time to heal. Discussed heel lift, patient work with athletic trainer to learn home exercises in greater detail. We discussed icing regimen and topical anti-inflammatories. Patient will come back again in 3-4 weeks. At that time if continuing have pain we may need to consider injection to decrease the inflammation. Patient also may need a Cam Walker if not make any significant improvement. He may also need to consider formal physical therapy. Patient will try to do these conservative therapy changes and come back in 4 weeks.

## 2015-03-27 NOTE — Assessment & Plan Note (Signed)
Patient does have a fibroma noted. We did discuss proper shoe choices and the importance of having her arch supported. We also discussed how hard shoes may make this somewhat difficult but patient is going to need it for the posterior tibialis. We discussed that possible injections will be needed at a later date.

## 2015-03-27 NOTE — Patient Instructions (Signed)
Good to see you.  Ice bath at night for 5-10 minutes pennsaid pinkie amount topically 2 times daily as needed.  Exercises 3 times a week.  Shoes with a heel or heel lift (happad.com) can be very helpful Avoid running or jumping Avoid being barefoot See me again in 4 weeks Happy holidays!

## 2015-03-31 ENCOUNTER — Encounter: Payer: Self-pay | Admitting: Gastroenterology

## 2015-03-31 ENCOUNTER — Ambulatory Visit (INDEPENDENT_AMBULATORY_CARE_PROVIDER_SITE_OTHER): Payer: BLUE CROSS/BLUE SHIELD | Admitting: Gastroenterology

## 2015-03-31 VITALS — BP 138/98 | HR 72 | Ht 63.0 in | Wt 240.4 lb

## 2015-03-31 DIAGNOSIS — K219 Gastro-esophageal reflux disease without esophagitis: Secondary | ICD-10-CM

## 2015-03-31 NOTE — Patient Instructions (Signed)

## 2015-03-31 NOTE — Progress Notes (Signed)
[name]    [id number]    1970/09/28  Primary Care Physician:[place], Lonell Grandchild, MD  Referring Physician: Alycia Rossetti, MD [address] [place], [place] [address]  Chief complaint:  GERD   HPI: 44 year old African-American female here for new patient visit with complaints of worsening GERD symptoms. She thinks she started having heartburn around 2004 and was on Zantac and recently had an increase in dose with somewhat better control in symptoms but continues to have breakthrough heartburn and acid taste in the mouth. According to her medication list she is currently on omeprazole 40 mg once a day, patient is unclear about what she is currently taking.  Denies any difficulty swallowing or pain on swallowing.  weight has remained stable over the years. Denies smoking and no history of NSAIDs  No family history of cancer.  Outpatient Encounter Prescriptions as of 03/31/2015  Medication Sig  . acetaminophen (TYLENOL) 650 MG CR tablet Take 650 mg by mouth every 8 (eight) hours as needed for pain.  Marland Kitchen amLODipine (NORVASC) 10 MG tablet Take 1 tablet (10 mg total) by mouth daily.  Marland Kitchen aspirin 81 MG EC tablet Take 81 mg by mouth daily.   Marland Kitchen atorvastatin (LIPITOR) 40 MG tablet Take 1 tablet (40 mg total) by mouth daily.  . blood glucose meter kit and supplies Please dispense One Touch Ultra. Use to monitor FSBS 2x daily. Dx: E11.9.  Marland Kitchen clotrimazole (LOTRIMIN) 1 % cream Apply 1 application topically 2 (two) times daily.  . Diclofenac Sodium 2 % SOLN Apply 1 pump twice daily.  . fluticasone (FLONASE) 50 MCG/ACT nasal spray Place 2 sprays into both nostrils daily.  Marland Kitchen glimepiride (AMARYL) 2 MG tablet Take 1 tablet (2 mg total) by mouth daily with breakfast.  . glucose blood (ONE TOUCH ULTRA TEST) test strip Use to monitor FSBS 2x daily. Dx E11.9  . hydrochlorothiazide (HYDRODIURIL) 25 MG tablet Take 1 tablet (25 mg total) by mouth daily.  Elmore Guise Devices (AUTO-LANCETS) MISC Test blood  sugar as directed  Dx 250.00  . Lancets Misc. KIT Use to monitor FSBS 2x daily. Dx E11.9  . Lancets MISC Use to monitor FSBS 2x daily. Dx E11.9  . lisinopril (PRINIVIL,ZESTRIL) 40 MG tablet Take 1 tablet (40 mg total) by mouth daily.  . metFORMIN (GLUCOPHAGE-XR) 500 MG 24 hr tablet Take 2 tablets (1,000 mg total) by mouth daily with breakfast.  . nitroGLYCERIN (NITROGLYN) 2 % ointment Apply to rectal area twice a day  . omeprazole (PRILOSEC) 40 MG capsule Take 1 capsule (40 mg total) by mouth daily.  . [DISCONTINUED] ranitidine (ZANTAC) 150 MG tablet Take 1 tablet (150 mg total) by mouth 2 (two) times daily.   No facility-administered encounter medications on file as of 03/31/2015.    Allergies as of 03/31/2015 - Review Complete 03/31/2015  Allergen Reaction Noted  . Other  11/07/2012  . Tape Rash 04/04/2014    Past Medical History  Diagnosis Date  . Allergy     seasonal  . Obesity   . Hemorrhoids   . Abnormal Pap smear   . Plantar fasciitis   . Atrial fibrillation ([place])   . Diabetes mellitus 2012  . GERD (gastroesophageal reflux disease)   . Hypertension 1995  . Hyperlipidemia 2015  . Sickle cell trait ([place]) 1995    Past Surgical History  Procedure Laterality Date  . Abdominal hysterectomy  2005    for Fibroids  .  Iliac vein angioplasty / stenting    . Cesarean section    . Tubal ligation    . Skin tag removal  Sept. 25, 2015    Right uppper arm  . Venogram N/A 11/07/2012    Procedure: VENOGRAM;  Surgeon: Serafina Mitchell, MD;  Location: [facility] CATH LAB;  Service: Cardiovascular;  Laterality: N/A;  . Venogram N/A 04/15/2014    Procedure: VENOGRAM;  Surgeon: Serafina Mitchell, MD;  Location: [facility] CATH LAB;  Service: Cardiovascular;  Laterality: N/A;    Family History  Problem Relation Age of Onset  . Diabetes Mother   . Hypertension Mother   . Hyperlipidemia Mother   . Heart disease Sister     [employer]  . Hearing loss Sister   . Learning disabilities Sister    . Heart attack Neg Hx   . Sudden death Neg Hx   . Hypertension Other   . Learning disabilities Daughter   . Asthma Maternal Aunt   . Diabetes Maternal Aunt   . Hyperlipidemia Maternal Aunt   . Hypertension Maternal Aunt   . Arthritis Maternal Grandmother   . Stroke Maternal Grandmother     Social History   Social History  . Marital Status: Single    Spouse Name: N/A  . Number of Children: N/A  . Years of Education: N/A   Occupational History  . Not on file.   Social History Main Topics  . Smoking status: Never Smoker   . Smokeless tobacco: Never Used  . Alcohol Use: No  . Drug Use: No  . Sexual Activity: Not Currently   Other Topics Concern  . Not on file   Social History Narrative      Review of systems: Review of Systems  Constitutional: Negative for fever and chills.  HENT: Negative.   Eyes: Negative for blurred vision.  Respiratory: Negative for cough, shortness of breath and wheezing.   Cardiovascular: Negative for chest pain and palpitations.  Gastrointestinal: as per HPI Genitourinary: Negative for dysuria, urgency, frequency and hematuria.  Musculoskeletal: Negative for myalgias, back pain and joint pain.  Skin: Negative for itching and rash.  Neurological: Negative for dizziness, tremors, focal weakness, seizures and loss of consciousness.  Endo/Heme/Allergies: Negative for environmental allergies.  Psychiatric/Behavioral: Negative for depression, suicidal ideas and hallucinations.  All other systems reviewed and are negative.   Physical Exam: Filed Vitals:   03/31/15 1503  BP: 138/98  Pulse: 72   Gen:      No acute distress HEENT:  EOMI, sclera anicteric Neck:     No masses; no thyromegaly Lungs:    Clear to auscultation bilaterally; normal respiratory effort CV:         Regular rate and rhythm; no murmurs Abd:      + bowel sounds; soft, non-tender; no palpable masses, no distension Ext:    No edema; adequate peripheral perfusion Skin:       Warm and dry; no rash Neuro: alert and oriented x 3 Psych: normal mood and affect  Data Reviewed:  Reviewed recent labs and chart   Assessment and Plan/Recommendations:  44 year old female, morbidly obese here with complaints of chronic GERD >12 yrs It is unclear based on history if patient's taking PPI or is still on Zantac Advised patient to start taking PPI once daily, 30 minutes before breakfast Can continue to take Zantac as needed at bedtime Discussed antireflux measures: No meals 3 hours before bedtime and to sleep with head end elevation Also discussed  weight loss and exercise and to avoid excessive fiber and high fat diet We'll schedule for EGD  Return in 3 months  K. Denzil Magnuson , MD [phone] Mon-Fri 8a-5p [phone] after 5p, weekends, holidays

## 2015-04-01 ENCOUNTER — Encounter: Payer: Self-pay | Admitting: Gastroenterology

## 2015-04-08 ENCOUNTER — Encounter: Payer: Self-pay | Admitting: Gastroenterology

## 2015-04-08 ENCOUNTER — Ambulatory Visit (AMBULATORY_SURGERY_CENTER): Payer: BLUE CROSS/BLUE SHIELD | Admitting: Gastroenterology

## 2015-04-08 VITALS — BP 134/91 | HR 80 | Temp 98.5°F | Resp 25 | Ht 63.0 in | Wt 240.0 lb

## 2015-04-08 DIAGNOSIS — K219 Gastro-esophageal reflux disease without esophagitis: Secondary | ICD-10-CM

## 2015-04-08 LAB — GLUCOSE, CAPILLARY
GLUCOSE-CAPILLARY: 139 mg/dL — AB (ref 65–99)
Glucose-Capillary: 101 mg/dL — ABNORMAL HIGH (ref 65–99)

## 2015-04-08 MED ORDER — SODIUM CHLORIDE 0.9 % IV SOLN: 500.0000 mL | INTRAVENOUS | Status: DC

## 2015-04-08 NOTE — Patient Instructions (Signed)
Normal exam today. GERD handout given. Continue current medications.   YOU HAD AN ENDOSCOPIC PROCEDURE TODAY AT [facility]:   Refer to the procedure report that was given to you for any specific questions about what was found during the examination.  If the procedure report does not answer your questions, please call your gastroenterologist to clarify.  If you requested that your care partner not be given the details of your procedure findings, then the procedure report has been included in a sealed envelope for you to review at your convenience later.  YOU SHOULD EXPECT: Some feelings of bloating in the abdomen. Passage of more gas than usual.  Walking can help get rid of the air that was put into your GI tract during the procedure and reduce the bloating. If you had a lower endoscopy (such as a colonoscopy or flexible sigmoidoscopy) you may notice spotting of blood in your stool or on the toilet paper. If you underwent a bowel prep for your procedure, you may not have a normal bowel movement for a few days.  Please Note:  You might notice some irritation and congestion in your nose or some drainage.  This is from the oxygen used during your procedure.  There is no need for concern and it should clear up in a day or so.  SYMPTOMS TO REPORT IMMEDIATELY:   Following upper endoscopy (EGD)  Vomiting of blood or coffee ground material  New chest pain or pain under the shoulder blades  Painful or persistently difficult swallowing  New shortness of breath  Fever of 100F or higher  Black, tarry-looking stools  For urgent or emergent issues, a gastroenterologist can be reached at any hour by calling [phone].   DIET: Your first meal following the procedure should be a small meal and then it is ok to progress to your normal diet. Heavy or fried foods are harder to digest and may make you feel nauseous or bloated.  Likewise, meals heavy in dairy and vegetables can increase  bloating.  Drink plenty of fluids but you should avoid alcoholic beverages for 24 hours.  ACTIVITY:  You should plan to take it easy for the rest of today and you should NOT DRIVE or use heavy machinery until tomorrow (because of the sedation medicines used during the test).    FOLLOW UP: Our staff will call the number listed on your records the next business day following your procedure to check on you and address any questions or concerns that you may have regarding the information given to you following your procedure. If we do not reach you, we will leave a message.  However, if you are feeling well and you are not experiencing any problems, there is no need to return our call.  We will assume that you have returned to your regular daily activities without incident.  If any biopsies were taken you will be contacted by phone or by letter within the next 1-3 weeks.  Please call us at [phone] if you have not heard about the biopsies in 3 weeks.    SIGNATURES/CONFIDENTIALITY: You and/or your care partner have signed paperwork which will be entered into your electronic medical record.  These signatures attest to the fact that that the information above on your After Visit Summary has been reviewed and is understood.  Full responsibility of the confidentiality of this discharge information lies with you and/or your care-partner.

## 2015-04-08 NOTE — Progress Notes (Signed)
A/ox3, pleased with MAC, report to RN 

## 2015-04-09 ENCOUNTER — Telehealth: Payer: Self-pay

## 2015-04-09 NOTE — Telephone Encounter (Signed)
  Follow up Call-  Call back number 04/08/2015  Post procedure Call Back phone  # [phone] cell  Permission to leave phone message Yes     Patient questions:  Do you have a fever, pain , or abdominal swelling? No. Pain Score  0 *  Have you tolerated food without any problems? Yes.    Have you been able to return to your normal activities? Yes.    Do you have any questions about your discharge instructions: Diet   No. Medications  No. Follow up visit  No.  Do you have questions or concerns about your Care? No.  Actions: * If pain score is 4 or above: No action needed, pain <4.

## 2015-04-09 NOTE — Op Note (Signed)
[facility]  [address] [place], [id number]   ENDOSCOPY PROCEDURE REPORT  PATIENT: [name], [place]  MR#: [id number] BIRTHDATE: 1970-06-16 , 50  yrs. old GENDER: female ENDOSCOPIST: Harl Bowie, MD REFERRED BY:  Vic Blackbird, M.D. PROCEDURE DATE:  04/08/2015 PROCEDURE:  EGD, diagnostic ASA CLASS:     Class II INDICATIONS:  heartburn and screening for Barrett's. MEDICATIONS: Propofol 150 mg IV TOPICAL ANESTHETIC: none  DESCRIPTION OF PROCEDURE: After the risks benefits and alternatives of the procedure were thoroughly explained, informed consent was obtained.  The LB [id number] endoscope was introduced through the mouth and advanced to the second portion of the duodenum , Without limitations.  The instrument was slowly withdrawn as the mucosa was fully examined.   EXAM: The esophagus and gastroesophageal junction were completely normal in appearance.  The stomach was entered and closely examined.The antrum, angularis, and lesser curvature were well visualized, including a retroflexed view of the cardia and fundus. The stomach wall was normally distensable.  The scope passed easily through the pylorus into the duodenum.  Retroflexed views revealed a hiatal hernia.     The scope was then withdrawn from the patient and the procedure completed.  COMPLICATIONS: There were no immediate complications.  ENDOSCOPIC IMPRESSION: Normal appearing esophagus and GE junction, the stomach was well visualized and normal in appearance, normal appearing duodenum  RECOMMENDATIONS: 1.  Anti-reflux regimen to be follow 2.  Continue PPI  (Omeprazole 40mg  once daily, 30 mins before breakfast)     eSigned:  Harl Bowie, MD 04/08/2015 2:28 PM

## 2015-05-05 ENCOUNTER — Other Ambulatory Visit: Payer: Self-pay | Admitting: Family Medicine

## 2015-05-08 ENCOUNTER — Encounter: Payer: Self-pay | Admitting: Surgery

## 2015-05-18 ENCOUNTER — Encounter: Payer: Self-pay | Admitting: Surgery

## 2015-05-18 ENCOUNTER — Ambulatory Visit (HOSPITAL_COMMUNITY)
Admission: RE | Admit: 2015-05-18 | Discharge: 2015-05-18 | Disposition: A | Discharge status: Home / Self Care | Payer: BLUE CROSS/BLUE SHIELD | Source: Ambulatory Visit | Attending: Surgery | Admitting: Surgery

## 2015-05-18 ENCOUNTER — Ambulatory Visit (INDEPENDENT_AMBULATORY_CARE_PROVIDER_SITE_OTHER): Payer: BLUE CROSS/BLUE SHIELD | Admitting: Surgery

## 2015-05-18 VITALS — BP 146/101 | HR 95 | Ht 63.0 in | Wt 241.0 lb

## 2015-05-18 DIAGNOSIS — I872 Venous insufficiency (chronic) (peripheral): Secondary | ICD-10-CM

## 2015-05-18 DIAGNOSIS — I10 Essential (primary) hypertension: Secondary | ICD-10-CM | POA: Diagnosis not present

## 2015-05-18 DIAGNOSIS — E785 Hyperlipidemia, unspecified: Secondary | ICD-10-CM | POA: Diagnosis not present

## 2015-05-18 DIAGNOSIS — Z48812 Encounter for surgical aftercare following surgery on the circulatory system: Secondary | ICD-10-CM

## 2015-05-18 DIAGNOSIS — E119 Type 2 diabetes mellitus without complications: Secondary | ICD-10-CM | POA: Diagnosis not present

## 2015-05-18 LAB — HM MAMMOGRAPHY

## 2015-05-18 NOTE — Progress Notes (Signed)
[facility]  Patient name: [name] MRN: [id number] DOB: 09/27/1970 Sex: female  REASON FOR VISIT: Follow-up  HPI: [name] is a 45 y.o. female who presents for follow-up of her leg swelling. She is status post left external iliac vein stenting in 2008 and in-stent venoplasty's in 2014 and 2015. The patient denies any significant swelling with her left leg. She notes swelling mainly during the warm months. She denies any significant discomfort in her legs. She has had pain with her left foot for the past 6 months. 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 She has been seeing Dr. Hulan Saas for this. She states that her left foot symptoms have gotten better. She is not currently wearing compression stockings. She does elevate her legs. Elevation does provide relief for her swelling.  Past Medical History  Diagnosis Date  . Allergy     seasonal  . Obesity   . Hemorrhoids   . Abnormal Pap smear   . Plantar fasciitis   . Atrial fibrillation ([place])   . Diabetes mellitus 2012  . GERD (gastroesophageal reflux disease)   . Hypertension 1995  . Hyperlipidemia 2015  . Sickle cell trait ([place]) 1995  . Sickle cell anemia ([facility])     sickle cell trait    Family History  Problem Relation Age of Onset  . Diabetes Mother   . Hypertension Mother   . Hyperlipidemia Mother   . Heart disease Sister     [employer]  . Hearing loss Sister   . Learning disabilities Sister   . Heart attack Neg Hx   . Sudden death Neg Hx   . Colon cancer Neg Hx   . Esophageal cancer Neg Hx   . Rectal cancer Neg Hx   . Stomach cancer Neg Hx   . Hypertension Other   . Learning disabilities Daughter   . Asthma Maternal Aunt   . Diabetes Maternal Aunt   . Hyperlipidemia Maternal Aunt   . Hypertension Maternal Aunt   . Arthritis Maternal Grandmother   . Stroke Maternal Grandmother     SOCIAL  HISTORY: Social History  Substance Use Topics  . Smoking status: Never Smoker   . Smokeless tobacco: Never Used  . Alcohol Use: No    Allergies  Allergen Reactions  . Other     Pt. States every time she is "put to sleep" she because becomes nauseated and runs a fever"  . Tape Rash    Current Outpatient Prescriptions  Medication Sig Dispense Refill  . acetaminophen (TYLENOL) 650 MG CR tablet Take 650 mg by mouth every 8 (eight) hours as needed for pain.    Marland Kitchen amLODipine (NORVASC) 10 MG tablet Take 1 tablet (10 mg total) by mouth daily. 30 tablet 2  . aspirin 81 MG EC tablet Take 81 mg by mouth daily.     Marland Kitchen atorvastatin (LIPITOR) 40 MG tablet Take 1 tablet (40 mg total) by mouth daily. 90 tablet 3  . blood glucose meter kit and supplies Please dispense One Touch Ultra. Use to monitor FSBS 2x daily. Dx: E11.9. 1 each 0  . clotrimazole (LOTRIMIN) 1 % cream Apply 1 application topically 2 (two) times daily. 45 g 0  . Diclofenac Sodium 2 % SOLN Apply 1 pump twice daily. 112 g 3  . fluticasone (FLONASE) 50 MCG/ACT nasal spray Place 2 sprays into both nostrils daily. 16 g 2  .  glimepiride (AMARYL) 2 MG tablet Take 1 tablet (2 mg total) by mouth daily with breakfast. 30 tablet 6  . glucose blood (ONE TOUCH ULTRA TEST) test strip Use to monitor FSBS 2x daily. Dx E11.9 100 each 12  . hydrochlorothiazide (HYDRODIURIL) 25 MG tablet Take 1 tablet (25 mg total) by mouth daily. 90 tablet 3  . Lancet Devices (AUTO-LANCETS) MISC Test blood sugar as directed  Dx 250.00 100 each 12  . Lancets MISC Use to monitor FSBS 2x daily. Dx E11.9 100 each 12  . lisinopril (PRINIVIL,ZESTRIL) 40 MG tablet Take 1 tablet (40 mg total) by mouth daily. 90 tablet 1  . metFORMIN (GLUCOPHAGE-XR) 500 MG 24 hr tablet Take 2 tablets (1,000 mg total) by mouth daily with breakfast. 60 tablet 2  . nitroGLYCERIN (NITROGLYN) 2 % ointment Apply to rectal area twice a day 30 g 0  . omeprazole (PRILOSEC) 40 MG capsule Take 1 capsule  (40 mg total) by mouth daily. 30 capsule 6   No current facility-administered medications for this visit.    REVIEW OF SYSTEMS:  _0  denotes positive finding, _1  denotes negative finding Cardiac  Comments:  Chest pain or chest pressure:    Shortness of breath upon exertion:    Short of breath when lying flat:    Irregular heart rhythm:        Vascular    Pain in calf, thigh, or hip brought on by ambulation:    Pain in feet at night that wakes you up from your sleep:     Blood clot in your veins:    Leg swelling:         Pulmonary    Oxygen at home:    Productive cough:     Wheezing:         Neurologic    Sudden weakness in arms or legs:     Sudden numbness in arms or legs:     Sudden onset of difficulty speaking or slurred speech:    Temporary loss of vision in one eye:     Problems with dizziness:         Gastrointestinal    Blood in stool:     Vomited blood:         Genitourinary    Burning when urinating:     Blood in urine:        Psychiatric    Major depression:         Hematologic    Bleeding problems:    Problems with blood clotting too easily:        Skin    Rashes or ulcers:        Constitutional    Fever or chills:      PHYSICAL EXAM: Filed Vitals:   05/18/15 1556 05/18/15 1558  BP: 147/102 146/101  Pulse: 95   Height: _2  (1.6 m)   Weight: 241 lb (109.317 kg)   SpO2: 96%     GENERAL: The patient is a well-nourished female, in no acute distress. The vital signs are documented above. VASCULAR: Palpable left dorsalis pedis pulse. PULMONARY: Nonlabored breathing MUSCULOSKELETAL: There are no major deformities or cyanosis. NEUROLOGIC: No focal weakness or paresthesias are detected. SKIN: There are no ulcers or rashes noted. PSYCHIATRIC: The patient has a normal affect.  DATA:  Lower extremity venous duplex evaluation 05/18/2015  No evidence of DVT in the left lower extremity. The left external iliac vein stent was not adequately  visualized due to location  and patient body habitus. However the left common femoral and distal external iliac waveforms suggest patency.  MEDICAL ISSUES:  Bilateral venous insufficiency History of left external iliac vein stent  The patient is having minimal swelling and discomfort with her legs. Her noninvasive studies suggest that her stent is patent. She mostly complains of pain with her left foot, which is secondary to a posterior tibialis tear and plantar fibroma. She is being followed by Dr. Hulan Saas for this. Her left foot pain has been improving.  She was offered prn follow-up or annual follow-up. The patient wishes to proceed with scheduled annual follow-up. She will return in 1 year for lower extremity venous duplex. She will be nothing by mouth for this procedure to maximize stent visualization.  Virgina Jock, PA-C [facility]   I agree with the above.  I have  Seen and examined the patient.  She will folllow up in 1 year with an [place]

## 2015-05-18 NOTE — Addendum Note (Signed)
Addended by: Mena Goes on: 05/18/2015 05:03 PM   Modules accepted: Orders

## 2015-05-21 ENCOUNTER — Encounter: Payer: Self-pay | Admitting: Family Medicine

## 2015-06-01 ENCOUNTER — Ambulatory Visit: Payer: BLUE CROSS/BLUE SHIELD | Admitting: Family Medicine

## 2015-06-02 ENCOUNTER — Encounter: Payer: Self-pay | Admitting: Family Medicine

## 2015-06-02 ENCOUNTER — Ambulatory Visit (INDEPENDENT_AMBULATORY_CARE_PROVIDER_SITE_OTHER): Payer: BLUE CROSS/BLUE SHIELD | Admitting: Family Medicine

## 2015-06-02 ENCOUNTER — Encounter: Payer: Self-pay | Admitting: *Deleted

## 2015-06-02 VITALS — BP 138/78 | HR 82 | Temp 98.8°F | Resp 14 | Ht 63.0 in | Wt 240.0 lb

## 2015-06-02 DIAGNOSIS — I10 Essential (primary) hypertension: Secondary | ICD-10-CM | POA: Diagnosis not present

## 2015-06-02 DIAGNOSIS — T148 Other injury of unspecified body region: Secondary | ICD-10-CM

## 2015-06-02 DIAGNOSIS — Z794 Long term (current) use of insulin: Secondary | ICD-10-CM

## 2015-06-02 DIAGNOSIS — T148XXA Other injury of unspecified body region, initial encounter: Secondary | ICD-10-CM

## 2015-06-02 DIAGNOSIS — E118 Type 2 diabetes mellitus with unspecified complications: Secondary | ICD-10-CM | POA: Diagnosis not present

## 2015-06-02 DIAGNOSIS — L089 Local infection of the skin and subcutaneous tissue, unspecified: Secondary | ICD-10-CM

## 2015-06-02 MED ORDER — AMLODIPINE BESYLATE 10 MG PO TABS: 10.0000 mg | ORAL_TABLET | Freq: Every day | ORAL | Status: DC

## 2015-06-02 MED ORDER — METFORMIN HCL ER 500 MG PO TB24: 1000.0000 mg | ORAL_TABLET | Freq: Every day | ORAL | Status: DC

## 2015-06-02 MED ORDER — FLUTICASONE PROPIONATE 50 MCG/ACT NA SUSP: 2.0000 | Freq: Every day | NASAL | Status: DC

## 2015-06-02 NOTE — Progress Notes (Signed)
Patient ID: [name], female   DOB: [birth year], 45 y.o.   MRN: [id number]   Subjective:    Patient ID: [name], female    DOB: January 27, 1971, 45 y.o.   MRN: [id number]  Patient presents for Blister  patient here for follow-up. She was seen at an urgent care over the weekend she noticed a large blister that came up on her  Right foot third digit. There was pain and swelling extending to beneath the digits. She was seen at the urgent care they seem to have punctured and taken a culture per report. She was placed on doxycycline. The pain is significantly improved the swelling has gone down she still has some redness in the blister is now flat. She also has 2 small little blisters on the medial aspect of that toe. She does report that she change to wearing boots because of a  tendinopathy she was told to wear something with a heel. She typically wears tennis shoes. She does not remember any particular injury to the toe. She states her blood sugars have been good but she did not bring her meter with her. Her last A1c was 7.8%. She is due for repeat A1c today.    Review Of Systems:  GEN- denies fatigue, fever, weight loss,weakness, recent illness HEENT- denies eye drainage, change in vision, nasal discharge, CVS- denies chest pain, palpitations RESP- denies SOB, cough, wheeze ABD- denies N/V, change in stools, abd pain GU- denies dysuria, hematuria, dribbling, incontinence MSK-+ joint pain, muscle aches, injury Neuro- denies headache, dizziness, syncope, seizure activity       Objective:    BP 138/78 mmHg  Pulse 82  Temp(Src) 98.8 F (37.1 C) (Oral)  Resp 14  Ht 5\' 3"  (1.6 m)  Wt 240 lb (108.863 kg)  BMI 42.52 kg/m2 GEN- NAD, alert and oriented x3 CVS- RRR, no murmur RESP-CTAB Skin- Right foot 3rd digit- medial aspect 2 small blisters, NT, lateral aspect flat blistered skin covering lateral aspect of toe, with mild erythema around edges, no drainage expressed, she had more sweat  between toes, NT, FROM toe joint, joint NT EXT- No edema Pulses- Radial, DP- 2+        Assessment & Plan:      Problem List Items Addressed This Visit    HYPERTENSION, BENIGN SYSTEMIC (Chronic)    Well controled      Relevant Medications   amLODipine (NORVASC) 10 MG tablet   Other Relevant Orders   Comprehensive metabolic panel   Diabetes mellitus (HCC) - Primary (Chronic)    Recheck A1C, needs A1C less than 7 5 Continue oral meds       Relevant Medications   metFORMIN (GLUCOPHAGE-XR) 500 MG 24 hr tablet   Other Relevant Orders   CBC with Differential/Platelet   Hemoglobin A1c    Other Visit Diagnoses    Infected blister        Continue doxycycline, obtain culture results from  [facility]. Antibacterial soap       Note: This dictation was prepared with Dragon dictation along with smaller phrase technology. Any transcriptional errors that result from this process are unintentional.

## 2015-06-02 NOTE — Patient Instructions (Signed)
We will call with lab results Continue antibiotics Antibacterial soap  Change to F/U to May

## 2015-06-02 NOTE — Assessment & Plan Note (Signed)
Recheck A1C, needs A1C less than 7 5 Continue oral meds

## 2015-06-02 NOTE — Assessment & Plan Note (Signed)
Well controled. 

## 2015-06-03 LAB — CBC WITH DIFFERENTIAL/PLATELET
BASOS ABS: 0 10*3/uL (ref 0.0–0.1)
BASOS PCT: 0 % (ref 0–1)
Eosinophils Absolute: 0.1 10*3/uL (ref 0.0–0.7)
Eosinophils Relative: 2 % (ref 0–5)
HCT: 40.6 % (ref 36.0–46.0)
HEMOGLOBIN: 13.5 g/dL (ref 12.0–15.0)
Lymphocytes Relative: 31 % (ref 12–46)
Lymphs Abs: 2.1 10*3/uL (ref 0.7–4.0)
MCH: 27.6 pg (ref 26.0–34.0)
MCHC: 33.3 g/dL (ref 30.0–36.0)
MCV: 83 fL (ref 78.0–100.0)
MONO ABS: 0.7 10*3/uL (ref 0.1–1.0)
MPV: 10.7 fL (ref 8.6–12.4)
Monocytes Relative: 10 % (ref 3–12)
NEUTROS ABS: 3.8 10*3/uL (ref 1.7–7.7)
NEUTROS PCT: 57 % (ref 43–77)
Platelets: 318 10*3/uL (ref 150–400)
RBC: 4.89 MIL/uL (ref 3.87–5.11)
RDW: 14.6 % (ref 11.5–15.5)
WBC: 6.7 10*3/uL (ref 4.0–10.5)

## 2015-06-03 LAB — COMPREHENSIVE METABOLIC PANEL
ALBUMIN: 3.9 g/dL (ref 3.6–5.1)
ALT: 13 U/L (ref 6–29)
AST: 13 U/L (ref 10–35)
Alkaline Phosphatase: 88 U/L (ref 33–115)
BILIRUBIN TOTAL: 0.4 mg/dL (ref 0.2–1.2)
BUN: 13 mg/dL (ref 7–25)
CO2: 22 mmol/L (ref 20–31)
CREATININE: 1.13 mg/dL — AB (ref 0.50–1.10)
Calcium: 9.3 mg/dL (ref 8.6–10.2)
Chloride: 105 mmol/L (ref 98–110)
GLUCOSE: 118 mg/dL — AB (ref 70–99)
Potassium: 3.7 mmol/L (ref 3.5–5.3)
SODIUM: 136 mmol/L (ref 135–146)
Total Protein: 7.2 g/dL (ref 6.1–8.1)

## 2015-06-03 LAB — HEMOGLOBIN A1C
Hgb A1c MFr Bld: 7.8 % — ABNORMAL HIGH (ref <5.7)
MEAN PLASMA GLUCOSE: 177 mg/dL — AB (ref <117)

## 2015-06-23 ENCOUNTER — Ambulatory Visit: Payer: BLUE CROSS/BLUE SHIELD | Admitting: Family Medicine

## 2015-06-24 ENCOUNTER — Ambulatory Visit: Payer: BLUE CROSS/BLUE SHIELD | Admitting: Family Medicine

## 2015-07-06 ENCOUNTER — Ambulatory Visit (INDEPENDENT_AMBULATORY_CARE_PROVIDER_SITE_OTHER): Payer: BLUE CROSS/BLUE SHIELD | Admitting: Family Medicine

## 2015-07-06 ENCOUNTER — Encounter: Payer: Self-pay | Admitting: Family Medicine

## 2015-07-06 VITALS — BP 140/82 | HR 86 | Wt 238.0 lb

## 2015-07-06 DIAGNOSIS — M76829 Posterior tibial tendinitis, unspecified leg: Secondary | ICD-10-CM

## 2015-07-06 DIAGNOSIS — M66872 Spontaneous rupture of other tendons, left ankle and foot: Secondary | ICD-10-CM

## 2015-07-06 DIAGNOSIS — M6789 Other specified disorders of synovium and tendon, multiple sites: Secondary | ICD-10-CM

## 2015-07-06 DIAGNOSIS — M722 Plantar fascial fibromatosis: Secondary | ICD-10-CM | POA: Diagnosis not present

## 2015-07-06 NOTE — Progress Notes (Signed)
Pre visit review using our clinic review tool, if applicable. No additional management support is needed unless otherwise documented below in the visit note. 

## 2015-07-06 NOTE — Patient Instructions (Signed)
Good to see you  Continue the good shoes We will get you into PT and I think it will be helpful.  Avoid being barefoot.  See me again in 6 weeks to make sure you are better and if needed then we will consider injection in the fibroma.

## 2015-07-06 NOTE — Progress Notes (Signed)
[name] [place], [place] [address] Phone: [phone] Subjective:    I'm seeing this patient by the request  of:  Vic Blackbird, MD   CC: Left foot painFollow-up   [id number] [name] is a 45 y.o. female coming in with complaint of left foot pain.  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 Patient elected to try conservative therapy. Patient has been wearing better shoes, home exercises, has been doing some of the topical anti-inflammatories. Patient states that she is approximately 25% better. Has days where she feels significantly better and then days when she feel significantly worse. Patient does not know what activities seem to exacerbate the situation. Patient is happy with some improvement but does wish she was better than she has at this time.  Past Medical History  Diagnosis Date  . Allergy     seasonal  . Obesity   . Hemorrhoids   . Abnormal Pap smear   . Plantar fasciitis   . Atrial fibrillation ([place])   . Diabetes mellitus 2012  . GERD (gastroesophageal reflux disease)   . Hypertension 1995  . Hyperlipidemia 2015  . Sickle cell trait ([place]) 1995  . Sickle cell anemia (HCC)     sickle cell trait   Past Surgical History  Procedure Laterality Date  . Abdominal hysterectomy  2005    for Fibroids  . Iliac vein angioplasty / stenting    . Cesarean section    . Tubal ligation    . Skin tag removal  Sept. 25, 2015    Right uppper arm  . Venogram N/A 11/07/2012    Procedure: VENOGRAM;  Surgeon: Serafina Mitchell, MD;  Location: [facility] CATH LAB;  Service: Cardiovascular;  Laterality: N/A;  . Venogram N/A 04/15/2014    Procedure: VENOGRAM;  Surgeon: Serafina Mitchell, MD;  Location: [facility] CATH LAB;  Service: Cardiovascular;  Laterality: N/A;   Family History  Problem Relation Age of Onset  . Diabetes Mother   . Hypertension Mother   .  Hyperlipidemia Mother   . Heart disease Sister     [employer]  . Hearing loss Sister   . Learning disabilities Sister   . Heart attack Neg Hx   . Sudden death Neg Hx   . Colon cancer Neg Hx   . Esophageal cancer Neg Hx   . Rectal cancer Neg Hx   . Stomach cancer Neg Hx   . Hypertension Other   . Learning disabilities Daughter   . Asthma Maternal Aunt   . Diabetes Maternal Aunt   . Hyperlipidemia Maternal Aunt   . Hypertension Maternal Aunt   . Arthritis Maternal Grandmother   . Stroke Maternal Grandmother    Social History  Substance Use Topics  . Smoking status: Never Smoker   . Smokeless tobacco: Never Used  . Alcohol Use: No   Allergies  Allergen Reactions  . Other     Pt. States every time she is "put to sleep" she because becomes nauseated and runs a fever"  . Tape Rash        Past medical history, social, surgical and family history all reviewed in electronic medical record.   Review of Systems: No headache, visual changes, nausea, vomiting, diarrhea, constipation, dizziness, abdominal pain, skin rash, fevers, chills, night sweats, weight loss, swollen lymph nodes, body aches, joint swelling, muscle aches, chest pain, shortness of breath,  mood changes.   Objective Blood pressure 140/82, pulse 86, weight 238 lb (107.956 kg), SpO2 99 %.  General: No apparent distress alert and oriented x3 mood and affect normal, dressed appropriately.  HEENT: Pupils equal, extraocular movements intact  Respiratory: Patient's speak in full sentences and does not appear short of breath  Cardiovascular: No lower extremity edema, non tender, no erythema  Skin: Warm dry intact with no signs of infection or rash on extremities or on axial skeleton.  Abdomen: Soft nontender  Neuro: Cranial nerves II through XII are intact, neurovascularly intact in all extremities with 2+ DTRs and 2+ pulses.  Lymph: No lymphadenopathy of posterior or anterior cervical chain or axillae  bilaterally.  Gait normal with good balance and coordination.  MSK:  Non tender with full range of motion and good stability and symmetric strength and tone of shoulders, elbows, wrist, hip, knee and ankles bilaterally.  Foot exam shows patient does have significant pes planus bilaterally. Patient does have severe over pronation of the left foot compared to the right foot especially the hindfoot and midfoot. Less tender to palpation over the posterior tibialis tendon. Negative pain to percussion over the navicular bone. Continues to have a delay with supination of the foot with plantar flexion. Neurovascular intact distally. Full range of motion of the ankle. Contralateral foot as stated above with mild breakdown of the transverse and longitudinal arch.      Impression and Recommendations:     This case required medical decision making of moderate complexity.

## 2015-07-06 NOTE — Assessment & Plan Note (Signed)
I believe the patient has more of a posterior tibialis tendon insufficiency. At this point patient will be sent to formal physical therapy for more strengthening exercises. I believe the patient is having difficulty doing them on her own. In addition of this we did discuss the importance of proper shoes. Patient will continue with topical anti-inflammatories. Patient will come back and see me again in 6 weeks. Encourage bracing.

## 2015-07-06 NOTE — Assessment & Plan Note (Signed)
Patient does have a fibroma. Seems less tender today been previous exam. If worsening symptoms we'll consider injection in 6 weeks.

## 2015-07-10 ENCOUNTER — Telehealth: Payer: Self-pay | Admitting: Family Medicine

## 2015-07-10 DIAGNOSIS — I10 Essential (primary) hypertension: Secondary | ICD-10-CM

## 2015-07-10 MED ORDER — LISINOPRIL 40 MG PO TABS: 40.0000 mg | ORAL_TABLET | Freq: Every day | ORAL | Status: DC

## 2015-07-10 MED ORDER — CANAGLIFLOZIN 100 MG PO TABS: 100.0000 mg | ORAL_TABLET | Freq: Every day | ORAL | Status: DC

## 2015-07-10 NOTE — Telephone Encounter (Signed)
Patient requesting refill called into [facility] for her invokana 100mg  and lisinopril 40 mg  CB#[phone]

## 2015-07-10 NOTE — Telephone Encounter (Signed)
Prescription sent to pharmacy.

## 2015-07-22 ENCOUNTER — Encounter: Payer: Self-pay | Admitting: Physical Therapy

## 2015-07-22 ENCOUNTER — Ambulatory Visit: Payer: BLUE CROSS/BLUE SHIELD | Attending: Family Medicine | Admitting: Physical Therapy

## 2015-07-22 DIAGNOSIS — M25672 Stiffness of left ankle, not elsewhere classified: Secondary | ICD-10-CM | POA: Diagnosis present

## 2015-07-22 DIAGNOSIS — M25572 Pain in left ankle and joints of left foot: Secondary | ICD-10-CM | POA: Diagnosis present

## 2015-07-22 DIAGNOSIS — R262 Difficulty in walking, not elsewhere classified: Secondary | ICD-10-CM | POA: Insufficient documentation

## 2015-07-22 NOTE — Therapy (Signed)
[facility] [place], [place], [address] Phone: [phone]   Fax:  [phone]  Physical Therapy Evaluation  Patient Details  Name: [name] MRN: [id number] Date of Birth: 1970/07/15 Referring Provider: Creig Hines  Encounter Date: 07/22/2015      PT End of Session - 07/22/15 1735    Visit Number 1   Date for PT Re-Evaluation 09/21/15   PT Start Time [id number]   PT Stop Time 1735   PT Time Calculation (min) 45 min   Activity Tolerance Patient tolerated treatment well   Behavior During Therapy [facility] for tasks assessed/performed      Past Medical History  Diagnosis Date  . Allergy     seasonal  . Obesity   . Hemorrhoids   . Abnormal Pap smear   . Plantar fasciitis   . Atrial fibrillation ([place])   . Diabetes mellitus 2012  . GERD (gastroesophageal reflux disease)   . Hypertension 1995  . Hyperlipidemia 2015  . Sickle cell trait ([place]) 1995  . Sickle cell anemia (HCC)     sickle cell trait    Past Surgical History  Procedure Laterality Date  . Abdominal hysterectomy  2005    for Fibroids  . Iliac vein angioplasty / stenting    . Cesarean section    . Tubal ligation    . Skin tag removal  Sept. 25, 2015    Right uppper arm  . Venogram N/A 11/07/2012    Procedure: VENOGRAM;  Surgeon: Serafina Mitchell, MD;  Location: [facility] CATH LAB;  Service: Cardiovascular;  Laterality: N/A;  . Venogram N/A 04/15/2014    Procedure: VENOGRAM;  Surgeon: Serafina Mitchell, MD;  Location: [facility] CATH LAB;  Service: Cardiovascular;  Laterality: N/A;    There were no vitals filed for this visit.  Visit Diagnosis:  Pain in left ankle and joints of left foot - Plan: PT plan of care cert/re-cert  Stiffness of left ankle, not elsewhere classified - Plan: PT plan of care cert/re-cert  Difficulty in walking, not elsewhere classified - Plan: PT plan of care cert/re-cert      Subjective Assessment - 07/22/15 1653    Subjective  Patient reports that she had plantar fascitis a few years ago but reports that it resolved but once she started walking more her foot and ankle really started to hurt.  She recently was diagnosed with a posterior tibialis tendon tear via musculoskeletal [place]   Limitations Standing;Walking   Patient Stated Goals walk without pain   Currently in Pain? Yes   Pain Score 2    Pain Location Foot   Pain Orientation Left;Medial   Pain Descriptors / Indicators Aching   Pain Type Chronic pain   Pain Onset More than a month ago   Pain Frequency Constant   Aggravating Factors  worse walking, standing pain up to 8-9/10   Pain Relieving Factors rest and elevation   Effect of Pain on Daily Activities difficulty with standing and walking            [facility] PT Assessment - 07/22/15 0001    Assessment   Medical Diagnosis left posterior tibialis tendon tear   Referring Provider Z. Smith   Onset Date/Surgical Date 06/23/15   Prior Therapy none   Precautions   Precautions None   Balance Screen   Has the patient fallen in the past 6 months No   Has the patient had a decrease in activity level  because of a fear of falling?  No   Is the patient reluctant to leave their home because of a fear of falling?  No   Home Environment   Additional Comments a few steps into the home , does housework   Prior Function   Level of Independence Independent   Vocation Full time employment   Vocation Requirements sitting and standing   Leisure no exercise   Posture/Postural Control   Posture Comments foot rolls in at the navicular, very flat footed   AROM   AROM Assessment Site Ankle   Right/Left Ankle Left   Left Ankle Dorsiflexion -15   Left Ankle Plantar Flexion 40   Left Ankle Inversion 20   Left Ankle Eversion 10   Strength   Overall Strength Comments 4-/5 with pain in the navicular for inversion and PF   Flexibility   Soft Tissue Assessment /Muscle Length --  extemely tight calves   Palpation    Palpation comment she is very tender plantar surface and over the navicular   Ambulation/Gait   Gait Comments no device, has inserts that help some, slight toe out and antalgic gait on the left, no heel off due to tightness in the calf                   [facility] PT Treatment/Exercise - 07/22/15 0001    Manual Therapy   Manual Therapy Taping   Manual therapy comments Leuko tape 3 pieces:  one plantar, one to support the posterior tibialis and the other from the great toe metatarsal head medial pull to and around the achilles                PT Education - 07/22/15 1734    Education provided Yes   Education Details HEP for gastroc and soleus stretches   Person(s) Educated Patient   Methods Explanation;Demonstration;Handout   Comprehension Verbalized understanding;Returned demonstration;Verbal cues required          PT Short Term Goals - 07/22/15 1738    PT SHORT TERM GOAL #1   Title independent with intitial HEP   Time 2   Period Weeks   Status New           PT Long Term Goals - 07/22/15 1738    PT LONG TERM GOAL #1   Title increase left ankle DF to neutral   Time 8   Period Weeks   Status New   PT LONG TERM GOAL #2   Title report pain decreased 50%   Time 8   Period Weeks   Status New   PT LONG TERM GOAL #3   Title independent with advanced HEP   Time 8   Period Weeks   Status New   PT LONG TERM GOAL #4   Title stand or walk > 10 minutes   Time 8   Period Weeks   Status New               Plan - 07/22/15 1735    Clinical Impression Statement Patient with left foot pain and a posterior tibialis tendon tear.  She has extremely tight calves, active DF was 15 degrees from neutral.  She is very tender plantar foot and over the navicular.  Very flat footed without shoes.   Pt will benefit from skilled therapeutic intervention in order to improve on the following deficits Abnormal gait;Decreased activity tolerance;Decreased  mobility;Decreased range of motion;Decreased strength;Difficulty walking;Increased fascial restricitons;Increased muscle spasms;Impaired flexibility;Pain  Rehab Potential Good   PT Frequency 1x / week   PT Duration 8 weeks   PT Treatment/Interventions ADLs/Self Care Home Management;Cryotherapy;Electrical Stimulation;Iontophoresis 4mg /ml Dexamethasone;Moist Heat;Therapeutic exercise;Therapeutic activities;Gait training;Ultrasound;Patient/family education;Manual techniques;Taping   PT Next Visit Plan see if tape helped, needs DF and flexibility, could try other modalities   Consulted and Agree with Plan of Care Patient         Problem List Patient Active Problem List   Diagnosis Date Noted  . Posterior tibialis tendon insufficiency 03/27/2015  . Tibialis posterior tendon tear, nontraumatic 03/27/2015  . Plantar fibromatosis 03/27/2015  . Hyperlipidemia 02/27/2015  . Anal fissure 01/27/2015  . Sickle cell trait ([place])   . Birthmark of skin 12/30/2013  . Chronic back pain 12/30/2013  . Peripheral vascular disease, unspecified ([place]) 12/02/2013  . Pain of right sacroiliac joint 12/17/2012  . Occlusion and stenosis of carotid artery without mention of cerebral infarction 10/29/2012  . H/O hysterectomy for benign disease-ovaries not removed 10/03/2011  . Pes planus 09/05/2011  . Plantar fasciitis of left foot 08/22/2011  . Atherosclerosis of native arteries of the extremities with intermittent claudication 04/27/2011  . CKD (chronic kidney disease) stage 2, GFR 60-89 ml/min 03/16/2011  . Chest tightness 02/14/2011  . Costochondritis 02/10/2011  . Diabetes mellitus ([place]) 06/02/2010  . PELVIC PAIN, CHRONIC 03/17/2010  . Internal hemorrhoids 06/04/2009  . OBESITY, NOS 06/22/2006  . HYPERTENSION, BENIGN SYSTEMIC 06/22/2006  . GASTROESOPHAGEAL REFLUX, NO ESOPHAGITIS 06/22/2006    Sumner Boast., PT 07/22/2015, 5:42 PM  [address] [place], [place], [address] Phone: [phone]   Fax:  [phone]  Name: [name] MRN: [id number] Date of Birth: [birth year]

## 2015-07-28 ENCOUNTER — Ambulatory Visit (INDEPENDENT_AMBULATORY_CARE_PROVIDER_SITE_OTHER): Payer: BLUE CROSS/BLUE SHIELD | Admitting: Physician Assistant

## 2015-07-28 VITALS — BP 136/100 | HR 90 | Temp 98.2°F | Resp 16 | Ht 63.0 in | Wt 246.0 lb

## 2015-07-28 DIAGNOSIS — B373 Candidiasis of vulva and vagina: Secondary | ICD-10-CM | POA: Diagnosis not present

## 2015-07-28 DIAGNOSIS — IMO0001 Reserved for inherently not codable concepts without codable children: Secondary | ICD-10-CM

## 2015-07-28 DIAGNOSIS — R030 Elevated blood-pressure reading, without diagnosis of hypertension: Secondary | ICD-10-CM | POA: Diagnosis not present

## 2015-07-28 DIAGNOSIS — N898 Other specified noninflammatory disorders of vagina: Secondary | ICD-10-CM

## 2015-07-28 DIAGNOSIS — R350 Frequency of micturition: Secondary | ICD-10-CM | POA: Diagnosis not present

## 2015-07-28 DIAGNOSIS — B3731 Acute candidiasis of vulva and vagina: Secondary | ICD-10-CM

## 2015-07-28 DIAGNOSIS — L298 Other pruritus: Secondary | ICD-10-CM

## 2015-07-28 LAB — POCT URINALYSIS DIP (MANUAL ENTRY)
BILIRUBIN UA: NEGATIVE
BILIRUBIN UA: NEGATIVE
Glucose, UA: 500 — AB
LEUKOCYTES UA: NEGATIVE
Nitrite, UA: NEGATIVE
PH UA: 5.5
Protein Ur, POC: NEGATIVE
RBC UA: NEGATIVE
Spec Grav, UA: 1.01
Urobilinogen, UA: 0.2

## 2015-07-28 LAB — POCT WET + KOH PREP
Trich by wet prep: ABSENT
Yeast by KOH: ABSENT

## 2015-07-28 LAB — POC MICROSCOPIC URINALYSIS (UMFC): Mucus: ABSENT

## 2015-07-28 MED ORDER — FLUCONAZOLE 150 MG PO TABS: 150.0000 mg | ORAL_TABLET | Freq: Once | ORAL | Status: DC

## 2015-07-28 NOTE — Progress Notes (Signed)
[facility] [address] [place] [address] [phone]  Date:  07/28/2015   Name:  [name]   DOB:  08-06-1970   MRN:  [id number]  PCP:  Vic Blackbird, MD    Chief Complaint: Vaginal Itching; Vaginal Pain; and Urinary Frequency   History of Present Illness:  This is a 45 y.o. female with PMH HLD, PVD, CKD stage 2, DM2, GERD, HTN who is presenting with with vaginal itching, vaginal pain x 1 week. States she bought "some sort of cream" for yeast OTC and applied to vulva for the past couple days but not relief. Over the past 2 days has developed some back pain as well. Denies vaginal discharge or odor. She states she is not prone to yeast infections. Had a hysterectomy in 2004. Was sexually active for the first time in 3 years about 2 weeks ago. Did not use protection.  Started invokana about 8 weeks ago. States since then she has had urinary frequency. Urinary frequency seems to have actually slowed down in the past week. No hematuria or dysuria.  Review of Systems:  Review of Systems See HPI  Patient Active Problem List   Diagnosis Date Noted  . Posterior tibialis tendon insufficiency 03/27/2015  . Tibialis posterior tendon tear, nontraumatic 03/27/2015  . Plantar fibromatosis 03/27/2015  . Hyperlipidemia 02/27/2015  . Anal fissure 01/27/2015  . Sickle cell trait ([place])   . Birthmark of skin 12/30/2013  . Chronic back pain 12/30/2013  . Peripheral vascular disease, unspecified ([place]) 12/02/2013  . Pain of right sacroiliac joint 12/17/2012  . Occlusion and stenosis of carotid artery without mention of cerebral infarction 10/29/2012  . H/O hysterectomy for benign disease-ovaries not removed 10/03/2011  . Pes planus 09/05/2011  . Plantar fasciitis of left foot 08/22/2011  . Atherosclerosis of native arteries of the extremities with intermittent claudication 04/27/2011  . CKD (chronic kidney disease) stage 2, GFR 60-89 ml/min 03/16/2011  . Chest  tightness 02/14/2011  . Costochondritis 02/10/2011  . Diabetes mellitus ([place]) 06/02/2010  . PELVIC PAIN, CHRONIC 03/17/2010  . Internal hemorrhoids 06/04/2009  . OBESITY, NOS 06/22/2006  . HYPERTENSION, BENIGN SYSTEMIC 06/22/2006  . GASTROESOPHAGEAL REFLUX, NO ESOPHAGITIS 06/22/2006    Prior to Admission medications   Medication Sig Start Date End Date Taking? Authorizing Provider  amLODipine (NORVASC) 10 MG tablet Take 1 tablet (10 mg total) by mouth daily. 06/02/15  Yes Alycia Rossetti, MD  aspirin 81 MG EC tablet Take 81 mg by mouth daily.    Yes Historical Provider, MD  atorvastatin (LIPITOR) 40 MG tablet Take 1 tablet (40 mg total) by mouth daily. 12/04/13  Yes Leeanne Rio, MD  blood glucose meter kit and supplies Please dispense One Touch Ultra. Use to monitor FSBS 2x daily. Dx: E11.9. 01/28/15  Yes Alycia Rossetti, MD  canagliflozin [facility]) 100 MG TABS tablet Take 1 tablet (100 mg total) by mouth daily before breakfast. 07/10/15  Yes Alycia Rossetti, MD  clotrimazole (LOTRIMIN) 1 % cream Apply 1 application topically 2 (two) times daily. 01/27/15  Yes Alycia Rossetti, MD  Diclofenac Sodium 2 % SOLN Apply 1 pump twice daily. 03/27/15  Yes Lyndal Pulley, DO  fluticasone (FLONASE) 50 MCG/ACT nasal spray Place 2 sprays into both nostrils daily. 06/02/15 10/30/17 Yes Alycia Rossetti, MD  glimepiride (AMARYL) 2 MG tablet Take 1 tablet (2 mg total) by mouth daily with breakfast. 01/27/15  Yes Alycia Rossetti, MD  glucose blood (  ONE TOUCH ULTRA TEST) test strip Use to monitor FSBS 2x daily. Dx E11.9 01/28/15  Yes Alycia Rossetti, MD  hydrochlorothiazide (HYDRODIURIL) 25 MG tablet Take 1 tablet (25 mg total) by mouth daily. 03/25/14  Yes Leeanne Rio, MD  Lancet Devices (AUTO-LANCETS) MISC Test blood sugar as directed  Dx 250.00 06/02/10  Yes Alycia Rossetti, MD  Lancets MISC Use to monitor FSBS 2x daily. Dx E11.9 01/28/15  Yes Alycia Rossetti, MD  lisinopril (PRINIVIL,ZESTRIL) 40  MG tablet Take 1 tablet (40 mg total) by mouth daily. 07/10/15  Yes Alycia Rossetti, MD  metFORMIN (GLUCOPHAGE-XR) 500 MG 24 hr tablet Take 2 tablets (1,000 mg total) by mouth daily with breakfast. 06/02/15 05/30/16 Yes Alycia Rossetti, MD  nitroGLYCERIN (NITROGLYN) 2 % ointment Apply to rectal area twice a day 01/27/15  Yes Alycia Rossetti, MD  omeprazole (PRILOSEC) 40 MG capsule Take 1 capsule (40 mg total) by mouth daily. 01/27/15  Yes Alycia Rossetti, MD  acetaminophen (TYLENOL) 650 MG CR tablet Take 650 mg by mouth every 8 (eight) hours as needed for pain. Reported on 07/28/2015    Historical Provider, MD    Allergies  Allergen Reactions  . Other     Pt. States every time she is "put to sleep" she because becomes nauseated and runs a fever"  . Tape Rash    Past Surgical History  Procedure Laterality Date  . Abdominal hysterectomy  2005    for Fibroids  . Iliac vein angioplasty / stenting    . Cesarean section    . Tubal ligation    . Skin tag removal  Sept. 25, 2015    Right uppper arm  . Venogram N/A 11/07/2012    Procedure: VENOGRAM;  Surgeon: Serafina Mitchell, MD;  Location: [facility] CATH LAB;  Service: Cardiovascular;  Laterality: N/A;  . Venogram N/A 04/15/2014    Procedure: VENOGRAM;  Surgeon: Serafina Mitchell, MD;  Location: [facility] CATH LAB;  Service: Cardiovascular;  Laterality: N/A;    Social History  Substance Use Topics  . Smoking status: Never Smoker   . Smokeless tobacco: Never Used  . Alcohol Use: No    Family History  Problem Relation Age of Onset  . Diabetes Mother   . Hypertension Mother   . Hyperlipidemia Mother   . Heart disease Sister     [employer]  . Hearing loss Sister   . Learning disabilities Sister   . Heart attack Neg Hx   . Sudden death Neg Hx   . Colon cancer Neg Hx   . Esophageal cancer Neg Hx   . Rectal cancer Neg Hx   . Stomach cancer Neg Hx   . Hypertension Other   . Learning disabilities Daughter   . Asthma Maternal Aunt   .  Diabetes Maternal Aunt   . Hyperlipidemia Maternal Aunt   . Hypertension Maternal Aunt   . Arthritis Maternal Grandmother   . Stroke Maternal Grandmother     Medication list has been reviewed and updated.  Physical Examination:  Physical Exam  Constitutional: She is oriented to person, place, and time. She appears well-developed and well-nourished. No distress.  HENT:  Head: Normocephalic and atraumatic.  Right Ear: Hearing normal.  Left Ear: Hearing normal.  Nose: Nose normal.  Eyes: Conjunctivae and lids are normal. Right eye exhibits no discharge. Left eye exhibits no discharge. No scleral icterus.  Pulmonary/Chest: Effort normal. No respiratory distress.  Genitourinary: Vagina normal. There  is no lesion on the right labia. There is no lesion on the left labia. No vaginal discharge found.  Cervix and uterus absent.  No adnexal tenderness. Some crusted white discharge on vulva. Mild erythema of vulva.  Musculoskeletal: Normal range of motion.  Neurological: She is alert and oriented to person, place, and time.  Skin: Skin is warm, dry and intact. No lesion and no rash noted.  Psychiatric: She has a normal mood and affect. Her speech is normal and behavior is normal. Thought content normal.   BP 136/100 mmHg  Pulse 90  Temp(Src) 98.2 F (36.8 C) (Oral)  Resp 16  Ht '5\' 3"'  (1.6 m)  Wt 246 lb (111.585 kg)  BMI 43.59 kg/m2  SpO2 96%  Results for orders placed or performed in visit on 07/28/15  POCT Microscopic Urinalysis ([facility])  Result Value Ref Range   WBC,UR,HPF,POC None None WBC/hpf   RBC,UR,HPF,POC None None RBC/hpf   Bacteria Few (A) None, Too numerous to count   Mucus Absent Absent   Epithelial Cells, UR Per Microscopy Moderate (A) None, Too numerous to count cells/hpf   Yeast, UA Present   POCT urinalysis dipstick  Result Value Ref Range   Color, UA yellow yellow   Clarity, UA clear clear   Glucose, UA =500 (A) negative   Bilirubin, UA negative negative    Ketones, POC UA negative negative   Spec Grav, UA 1.010    Blood, UA negative negative   pH, UA 5.5    Protein Ur, POC negative negative   Urobilinogen, UA 0.2    Nitrite, UA Negative Negative   Leukocytes, UA Negative Negative  POCT Wet + KOH Prep  Result Value Ref Range   Yeast by KOH Absent Present, Absent   Yeast by wet prep Present Present, Absent   WBC by wet prep Few None, Few, Too numerous to count   Clue Cells Wet Prep HPF POC None None, Too numerous to count   Trich by wet prep Absent Present, Absent   Bacteria Wet Prep HPF POC Moderate (A) None, Few, Too numerous to count   Epithelial Cells By [employer] Pref ([facility]) Many (A) None, Few, Too numerous to count   RBC,UR,HPF,POC None None RBC/hpf   Assessment and Plan:  1. Urine frequency 2. Vaginal itching 3. Yeast vaginitis UA with 500 glucose (d/t invokana) and yeast. Wet prep positive for yeast. G/C pending. Possible side effect to invokana. Make f/u appt with PCP to discuss. Treat with diflucan. Return in 1 week if symptoms do not improve or at any time if symptoms worsen.  - POCT Microscopic Urinalysis ([facility]) - POCT urinalysis dipstick - POCT Wet + KOH Prep - GC/Chlamydia Probe Amp - fluconazole (DIFLUCAN) 150 MG tablet; Take 1 tablet (150 mg total) by mouth once. Repeat in 72 hours.  Dispense: 2 tablet; Refill: 0  4. Elevated BP Check BP at home and keep record. Follow up with PCP.   Benjaman Pott Drenda Freeze, MHS [facility]  07/28/2015

## 2015-07-28 NOTE — Patient Instructions (Addendum)
Take diflucan pill once day, repeat in 72 hours. Try to stay as dry as possible. Follow up with Dr. Buelah Manis regarding your urinary frequency and invokana   IF you received an x-ray today, you will receive an invoice from [facility]. Please contact [facility] at [phone] with questions or concerns regarding your invoice.   IF you received labwork today, you will receive an invoice from [employer]. Please contact [facility] at [phone] with questions or concerns regarding your invoice.   Our billing staff will not be able to assist you with questions regarding bills from these companies.  You will be contacted with the lab results as soon as they are available. The fastest way to get your results is to activate your My Chart account. Instructions are located on the last page of this paperwork. If you have not heard from [place] regarding the results in 2 weeks, please contact this office.

## 2015-07-30 ENCOUNTER — Ambulatory Visit: Payer: BLUE CROSS/BLUE SHIELD | Admitting: Physical Therapy

## 2015-07-30 LAB — GC/CHLAMYDIA PROBE AMP
CT PROBE, AMP APTIMA: NOT DETECTED
GC PROBE AMP APTIMA: NOT DETECTED

## 2015-08-03 ENCOUNTER — Encounter: Payer: Self-pay | Admitting: Family Medicine

## 2015-08-12 ENCOUNTER — Encounter: Payer: Self-pay | Admitting: Physical Therapy

## 2015-08-12 ENCOUNTER — Ambulatory Visit: Payer: BLUE CROSS/BLUE SHIELD | Attending: Family Medicine | Admitting: Physical Therapy

## 2015-08-12 DIAGNOSIS — R262 Difficulty in walking, not elsewhere classified: Secondary | ICD-10-CM | POA: Diagnosis present

## 2015-08-12 DIAGNOSIS — M25572 Pain in left ankle and joints of left foot: Secondary | ICD-10-CM | POA: Insufficient documentation

## 2015-08-12 DIAGNOSIS — M25672 Stiffness of left ankle, not elsewhere classified: Secondary | ICD-10-CM | POA: Insufficient documentation

## 2015-08-12 NOTE — Therapy (Addendum)
[place], [place], [address] Phone: [phone]   Fax:  [phone]  Physical Therapy Treatment  Patient Details  Name: [name] MRN: [id number] Date of Birth: [birth year] Referring Provider: Creig Hines  Encounter Date: 08/12/2015      PT End of Session - 08/12/15 1723    Visit Number 2   Date for PT Re-Evaluation 09/21/15   PT Start Time 1648   PT Stop Time 1729   PT Time Calculation (min) 41 min   Activity Tolerance Patient tolerated treatment well   Behavior During Therapy [facility] for tasks assessed/performed      Past Medical History  Diagnosis Date  . Allergy     seasonal  . Obesity   . Hemorrhoids   . Abnormal Pap smear   . Plantar fasciitis   . Atrial fibrillation ([place])   . Diabetes mellitus 2012  . GERD (gastroesophageal reflux disease)   . Hypertension 1995  . Hyperlipidemia 2015  . Sickle cell trait ([place]) 1995  . Sickle cell anemia ([facility])     sickle cell trait    Past Surgical History  Procedure Laterality Date  . Abdominal hysterectomy  2005    for Fibroids  . Iliac vein angioplasty / stenting    . Cesarean section    . Tubal ligation    . Skin tag removal  Sept. 25, 2015    Right uppper arm  . Venogram N/A 11/07/2012    Procedure: VENOGRAM;  Surgeon: Serafina Mitchell, MD;  Location: [facility] CATH LAB;  Service: Cardiovascular;  Laterality: N/A;  . Venogram N/A 04/15/2014    Procedure: VENOGRAM;  Surgeon: Serafina Mitchell, MD;  Location: [facility] CATH LAB;  Service: Cardiovascular;  Laterality: N/A;    There were no vitals filed for this visit.      Subjective Assessment - 08/12/15 1649    Subjective Patient reports that she felt like the tape really helped.  she reports that she just changed shoes after work today and now has some increase of pain   Currently in Pain? Yes   Pain Score 5    Pain Location Foot   Pain Orientation Left;Medial   Aggravating Factors  shoes  without heel   Pain Relieving Factors the tape                         [facility] Adult PT Treatment/Exercise - 08/12/15 0001    Exercises   Exercises Ankle   Manual Therapy   Manual Therapy Taping   Manual therapy comments Leuko tape 3 pieces:  one plantar, one to support the posterior tibialis and the other from the great toe metatarsal head medial pull to and around the achilles   Ankle Exercises: Stretches   Soleus Stretch 3 reps;10 seconds   Gastroc Stretch 3 reps;20 seconds   Ankle Exercises: Aerobic   Elliptical NuStep level 4 x 5 minutes   Ankle Exercises: Standing   BAPS Limitations used sit fit for all ankle motions similar to BAPS   Ankle Exercises: Seated   Towel Crunch 5 reps   Ankle Exercises: Supine   T-Band red all ankle motions                  PT Short Term Goals - 08/12/15 1727    PT SHORT TERM GOAL #1   Title independent with intitial HEP   Status Achieved  PT Long Term Goals - 07/22/15 1738    PT LONG TERM GOAL #1   Title increase left ankle DF to neutral   Time 8   Period Weeks   Status New   PT LONG TERM GOAL #2   Title report pain decreased 50%   Time 8   Period Weeks   Status New   PT LONG TERM GOAL #3   Title independent with advanced HEP   Time 8   Period Weeks   Status New   PT LONG TERM GOAL #4   Title stand or walk > 10 minutes   Time 8   Period Weeks   Status New               Plan - 08/12/15 1726    Clinical Impression Statement Patient with very tight calves and with decreased ability to flex toes.  The tape seems to help her.   PT Next Visit Plan  needs DF and flexibility, could try other modalities   Consulted and Agree with Plan of Care Patient      Patient will benefit from skilled therapeutic intervention in order to improve the following deficits and impairments:     Visit Diagnosis: Stiffness of left ankle, not elsewhere classified  Difficulty in walking, not elsewhere  classified  Pain in left ankle and joints of left foot     Problem List Patient Active Problem List   Diagnosis Date Noted  . Posterior tibialis tendon insufficiency 03/27/2015  . Tibialis posterior tendon tear, nontraumatic 03/27/2015  . Plantar fibromatosis 03/27/2015  . Hyperlipidemia 02/27/2015  . Anal fissure 01/27/2015  . Sickle cell trait ([place])   . Birthmark of skin 12/30/2013  . Chronic back pain 12/30/2013  . Peripheral vascular disease, unspecified ([place]) 12/02/2013  . Pain of right sacroiliac joint 12/17/2012  . Occlusion and stenosis of carotid artery without mention of cerebral infarction 10/29/2012  . H/O hysterectomy for benign disease-ovaries not removed 10/03/2011  . Pes planus 09/05/2011  . Plantar fasciitis of left foot 08/22/2011  . Atherosclerosis of native arteries of the extremities with intermittent claudication 04/27/2011  . CKD (chronic kidney disease) stage 2, GFR 60-89 ml/min 03/16/2011  . Chest tightness 02/14/2011  . Costochondritis 02/10/2011  . Diabetes mellitus ([place]) 06/02/2010  . PELVIC PAIN, CHRONIC 03/17/2010  . Internal hemorrhoids 06/04/2009  . OBESITY, NOS 06/22/2006  . HYPERTENSION, BENIGN SYSTEMIC 06/22/2006  . GASTROESOPHAGEAL REFLUX, NO ESOPHAGITIS 06/22/2006   PHYSICAL THERAPY DISCHARGE SUMMARY   Plan: Patient agrees to discharge.  Patient goals were not met. Patient is being discharged due to not returning since the last visit.  ?????        [place]ner Boast., PT 08/12/2015, 5:28 PM  [place], [place], [address] Phone: [phone]   Fax:  [phone]  Name: [name] MRN: [id number] Date of Birth: April 30, 1970

## 2015-08-17 ENCOUNTER — Ambulatory Visit (INDEPENDENT_AMBULATORY_CARE_PROVIDER_SITE_OTHER): Payer: BLUE CROSS/BLUE SHIELD | Admitting: Family Medicine

## 2015-08-17 ENCOUNTER — Encounter (HOSPITAL_COMMUNITY): Payer: Self-pay | Admitting: Emergency Medicine

## 2015-08-17 ENCOUNTER — Encounter: Payer: Self-pay | Admitting: Family Medicine

## 2015-08-17 ENCOUNTER — Emergency Department (HOSPITAL_COMMUNITY): Payer: BLUE CROSS/BLUE SHIELD

## 2015-08-17 ENCOUNTER — Telehealth: Payer: Self-pay | Admitting: Family Medicine

## 2015-08-17 ENCOUNTER — Emergency Department (HOSPITAL_COMMUNITY)
Admission: EM | Admit: 2015-08-17 | Discharge: 2015-08-17 | Disposition: A | Discharge status: Home / Self Care | Payer: BLUE CROSS/BLUE SHIELD | Attending: Emergency Medicine | Admitting: Emergency Medicine

## 2015-08-17 VITALS — BP 120/84 | HR 97 | Ht 63.0 in | Wt 242.0 lb

## 2015-08-17 DIAGNOSIS — K219 Gastro-esophageal reflux disease without esophagitis: Secondary | ICD-10-CM | POA: Insufficient documentation

## 2015-08-17 DIAGNOSIS — R0789 Other chest pain: Secondary | ICD-10-CM

## 2015-08-17 DIAGNOSIS — Y92410 Unspecified street and highway as the place of occurrence of the external cause: Secondary | ICD-10-CM | POA: Insufficient documentation

## 2015-08-17 DIAGNOSIS — Y9389 Activity, other specified: Secondary | ICD-10-CM | POA: Insufficient documentation

## 2015-08-17 DIAGNOSIS — I4891 Unspecified atrial fibrillation: Secondary | ICD-10-CM | POA: Diagnosis not present

## 2015-08-17 DIAGNOSIS — Y999 Unspecified external cause status: Secondary | ICD-10-CM | POA: Diagnosis not present

## 2015-08-17 DIAGNOSIS — E669 Obesity, unspecified: Secondary | ICD-10-CM | POA: Diagnosis not present

## 2015-08-17 DIAGNOSIS — E119 Type 2 diabetes mellitus without complications: Secondary | ICD-10-CM | POA: Diagnosis not present

## 2015-08-17 DIAGNOSIS — E785 Hyperlipidemia, unspecified: Secondary | ICD-10-CM | POA: Insufficient documentation

## 2015-08-17 DIAGNOSIS — Z79899 Other long term (current) drug therapy: Secondary | ICD-10-CM | POA: Diagnosis not present

## 2015-08-17 DIAGNOSIS — Z7984 Long term (current) use of oral hypoglycemic drugs: Secondary | ICD-10-CM | POA: Insufficient documentation

## 2015-08-17 DIAGNOSIS — I10 Essential (primary) hypertension: Secondary | ICD-10-CM | POA: Insufficient documentation

## 2015-08-17 DIAGNOSIS — M25532 Pain in left wrist: Secondary | ICD-10-CM

## 2015-08-17 DIAGNOSIS — M76829 Posterior tibial tendinitis, unspecified leg: Secondary | ICD-10-CM

## 2015-08-17 DIAGNOSIS — M6789 Other specified disorders of synovium and tendon, multiple sites: Secondary | ICD-10-CM | POA: Diagnosis not present

## 2015-08-17 DIAGNOSIS — Z7982 Long term (current) use of aspirin: Secondary | ICD-10-CM | POA: Insufficient documentation

## 2015-08-17 MED ORDER — CYCLOBENZAPRINE HCL 10 MG PO TABS: 10.0000 mg | ORAL_TABLET | Freq: Two times a day (BID) | ORAL | Status: DC | PRN

## 2015-08-17 MED ORDER — NAPROXEN 500 MG PO TABS: 500.0000 mg | ORAL_TABLET | Freq: Two times a day (BID) | ORAL | Status: DC

## 2015-08-17 MED ORDER — HYDROCODONE-ACETAMINOPHEN 5-325 MG PO TABS
1.0000 | ORAL_TABLET | Freq: Once | ORAL | Status: DC
  Filled 2015-08-17: qty 1
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
## 2015-08-17 NOTE — Telephone Encounter (Signed)
Call placed to patient and patient made aware.   States that she will call back in AM to schedule appointment.
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
## 2015-08-17 NOTE — Progress Notes (Signed)
[name] [place], [place] [address] Phone: [phone] Subjective:    I'm seeing this patient by the request  of:  Vic Blackbird, MD   CC: Left foot pain Follow-up   [id number] [name] is a 45 y.o. female coming in with complaint of left foot pain.  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 Patient elected to try conservative therapy. Patient has been wearing better shoes, home exercises, has been doing some of the topical anti-inflammatories.  Patient also started with formal physical therapy. States that he has made significant improvement. States that she is now 90% better. Not having as much pain. Overall very happy with the results. Can do all daily activities and is even started working on a more regular basis.  Past Medical History  Diagnosis Date  . Allergy     seasonal  . Obesity   . Hemorrhoids   . Abnormal Pap smear   . Plantar fasciitis   . Atrial fibrillation ([place])   . Diabetes mellitus 2012  . GERD (gastroesophageal reflux disease)   . Hypertension 1995  . Hyperlipidemia 2015  . Sickle cell trait ([place]) 1995  . Sickle cell anemia ([facility])     sickle cell trait   Past Surgical History  Procedure Laterality Date  . Abdominal hysterectomy  2005    for Fibroids  . Iliac vein angioplasty / stenting    . Cesarean section    . Tubal ligation    . Skin tag removal  Sept. 25, 2015    Right uppper arm  . Venogram N/A 11/07/2012    Procedure: VENOGRAM;  Surgeon: Serafina Mitchell, MD;  Location: [facility] C[facility] LAB;  Service: Cardiovascular;  Laterality: N/A;  . Venogram N/A 04/15/2014    Procedure: VENOGRAM;  Surgeon: Serafina Mitchell, MD;  Location: [facility] CATH LAB;  Service: Cardiovascular;  Laterality: N/A;   Family History  Problem Relation Age of Onset  . Diabetes Mother   . Hypertension Mother   . Hyperlipidemia Mother   . Heart disease  Sister     [employer]  . Hearing loss Sister   . Learning disabilities Sister   . Heart attack Neg Hx   . Sudden death Neg Hx   . Colon cancer Neg Hx   . Esophageal cancer Neg Hx   . Rectal cancer Neg Hx   . Stomach cancer Neg Hx   . Hypertension Other   . Learning disabilities Daughter   . Asthma Maternal Aunt   . Diabetes Maternal Aunt   . Hyperlipidemia Maternal Aunt   . Hypertension Maternal Aunt   . Arthritis Maternal Grandmother   . Stroke Maternal Grandmother    Social History  Substance Use Topics  . Smoking status: Never Smoker   . Smokeless tobacco: Never Used  . Alcohol Use: No   Allergies  Allergen Reactions  . Other     Pt. States every time she is "put to sleep" she because becomes nauseated and runs a fever"  . Tape Rash        Past medical history, social, surgical and family history all reviewed in electronic medical record.   Review of Systems: No headache, visual changes, nausea, vomiting, diarrhea, constipation, dizziness, abdominal pain, skin rash, fevers, chills, night sweats, weight loss, swollen lymph nodes, body aches, joint swelling, muscle aches, chest pain, shortness of breath, mood changes.  Objective Blood pressure 120/84, pulse 97, height 5\' 3"  (1.6 m), weight 242 lb (109.77 kg), SpO2 99 %.  General: No apparent distress alert and oriented x3 mood and affect normal, dressed appropriately.  HEENT: Pupils equal, extraocular movements intact  Respiratory: Patient's speak in full sentences and does not appear short of breath  Cardiovascular: No lower extremity edema, non tender, no erythema  Skin: Warm dry intact with no signs of infection or rash on extremities or on axial skeleton.  Abdomen: Soft nontender  Neuro: Cranial nerves II through XII are intact, neurovascularly intact in all extremities with 2+ DTRs and 2+ pulses.  Lymph: No lymphadenopathy of posterior or anterior cervical chain or axillae bilaterally.  Gait normal  with good balance and coordination.  MSK:  Non tender with full range of motion and good stability and symmetric strength and tone of shoulders, elbows, wrist, hip, knee and ankles bilaterally.  Foot exam shows patient does have significant pes planus bilaterally. Patient does have severe over pronation of the left foot compared to the right foot especially the hindfoot and midfoot. nontender over the posterior tibialis  Negative pain to percussion over the navicular bone. Improvement in symptoms spinatus with increasing strength of the posterior tibialisfull range of motion of the ankle.. Contralateral foot as stated above with mild breakdown of the transverse and longitudinal arch.      Impression and Recommendations:     This case required medical decision making of moderate complexity.

## 2015-08-17 NOTE — Telephone Encounter (Signed)
Call placed to patient.   States that she has been having excessive urination since beginning the [place].   Reports that she has itching on the outer portion of her labia. Reports that she was treated at [facility] on 07/28/2015, but itching has returned.   States that there is no drainage or discharge at this time.   MD please advise.

## 2015-08-17 NOTE — Telephone Encounter (Signed)
Pt was seen @ [facility] on 4/4 and was DX with a yeast infection. She believes that it is the Invokana that is causing it.  Please advise [phone] [facility] on [address]

## 2015-08-17 NOTE — Patient Instructions (Signed)
Good to see you ce is good if you need it and start every night Exercises 2 times  aweek until gone I like the shoes See me again when you need me or in 4 week if not better./
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
## 2015-08-17 NOTE — Telephone Encounter (Signed)
She may have UTI due to invokana and recurrent yeast infection all side effects of the medication See if she can come in for visit  She can stop the [facility] FOR NOW I will plan to start Januvia 100mg  once a day

## 2015-08-17 NOTE — Assessment & Plan Note (Signed)
Patient is making improvement. We'll continue with formal physical therapy until she is out of encounters. We discussed icing regimen. Continuing the home exercises 2 times a week. Encourage her to continue to good shoes. Follow-up with me in 4 weeks if not completely resolved at that time.

## 2015-08-17 NOTE — Progress Notes (Signed)
Pre visit review using our clinic review tool, if applicable. No additional management support is needed unless otherwise documented below in the visit note. 

## 2015-08-17 NOTE — ED Notes (Signed)
Patient denies SOB, nausea, vomiting. [name] stated she does not need full chest work up at this time. Rt sided chest pain reproducable with palpation

## 2015-08-21 ENCOUNTER — Ambulatory Visit (INDEPENDENT_AMBULATORY_CARE_PROVIDER_SITE_OTHER): Payer: BLUE CROSS/BLUE SHIELD | Admitting: Family Medicine

## 2015-08-21 ENCOUNTER — Encounter: Payer: Self-pay | Admitting: Family Medicine

## 2015-08-21 DIAGNOSIS — N182 Chronic kidney disease, stage 2 (mild): Secondary | ICD-10-CM | POA: Diagnosis not present

## 2015-08-21 DIAGNOSIS — E1122 Type 2 diabetes mellitus with diabetic chronic kidney disease: Secondary | ICD-10-CM | POA: Diagnosis not present

## 2015-08-21 DIAGNOSIS — M25571 Pain in right ankle and joints of right foot: Secondary | ICD-10-CM

## 2015-08-21 DIAGNOSIS — M25532 Pain in left wrist: Secondary | ICD-10-CM | POA: Diagnosis not present

## 2015-08-21 MED ORDER — FLUCONAZOLE 150 MG PO TABS: 150.0000 mg | ORAL_TABLET | Freq: Once | ORAL | Status: DC

## 2015-08-21 MED ORDER — SITAGLIPTIN PHOSPHATE 100 MG PO TABS: 100.0000 mg | ORAL_TABLET | Freq: Every day | ORAL | Status: DC
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
## 2015-08-21 NOTE — Patient Instructions (Addendum)
Take the naprosyn twice a day  Take the flexeril Take the januvia  Alternate ice and heat to joints Give note for work 4/27-4/28 due to injury  , okay to return as scheduled May 1st  F/U 1 month

## 2015-08-24 NOTE — Assessment & Plan Note (Signed)
Change to Januvia due to recurrent yeast infections. Continue metformin

## 2015-08-25 ENCOUNTER — Encounter: Payer: Self-pay | Admitting: Family Medicine

## 2015-08-25 ENCOUNTER — Ambulatory Visit (INDEPENDENT_AMBULATORY_CARE_PROVIDER_SITE_OTHER): Payer: BLUE CROSS/BLUE SHIELD | Admitting: Family Medicine

## 2015-08-25 VITALS — BP 122/84 | HR 89 | Ht 63.0 in | Wt 239.0 lb

## 2015-08-25 DIAGNOSIS — S93401A Sprain of unspecified ligament of right ankle, initial encounter: Secondary | ICD-10-CM | POA: Diagnosis not present

## 2015-08-25 NOTE — Assessment & Plan Note (Signed)
Patient does have more of a right ankle sprain. We discussed home exercises, icing protocol, which activities to do in which ones to avoid. We discussed bracing but unfortunately we were out of the brace and her size. We will call and we will get a. Topical anti-inflammatories were prescribed again. Discussed proper shoes. Follow-up again in 3 weeks if not completely resolved. Do not feel imaging is necessary at this time.

## 2015-08-25 NOTE — Patient Instructions (Addendum)
Good to see you  Ice 20 minutes 2 times daily. Usually after activity and before bed. We will call yo on the brace.  pennsaid pinkie amount topically 2 times daily as needed.  The same exercises you were doing with the other foot do them 3 times a week .  When sitting spell the alphabet with your foot See me again in 3 weeks if not completely helping.

## 2015-08-25 NOTE — Progress Notes (Signed)
Pre visit review using our clinic review tool, if applicable. No additional management support is needed unless otherwise documented below in the visit note. 
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
## 2015-09-08 ENCOUNTER — Other Ambulatory Visit: Payer: Self-pay

## 2015-09-08 ENCOUNTER — Encounter: Payer: Self-pay | Admitting: Family Medicine

## 2015-09-08 ENCOUNTER — Ambulatory Visit (INDEPENDENT_AMBULATORY_CARE_PROVIDER_SITE_OTHER)
Admission: RE | Admit: 2015-09-08 | Discharge: 2015-09-08 | Disposition: A | Discharge status: Home / Self Care | Payer: BLUE CROSS/BLUE SHIELD | Source: Ambulatory Visit | Attending: Family Medicine | Admitting: Family Medicine

## 2015-09-08 ENCOUNTER — Ambulatory Visit (INDEPENDENT_AMBULATORY_CARE_PROVIDER_SITE_OTHER): Payer: BLUE CROSS/BLUE SHIELD | Admitting: Family Medicine

## 2015-09-08 VITALS — BP 138/90 | HR 93 | Ht 63.0 in | Wt 240.0 lb

## 2015-09-08 DIAGNOSIS — M79671 Pain in right foot: Secondary | ICD-10-CM | POA: Diagnosis not present

## 2015-09-08 DIAGNOSIS — M25571 Pain in right ankle and joints of right foot: Secondary | ICD-10-CM

## 2015-09-08 DIAGNOSIS — M6788 Other specified disorders of synovium and tendon, other site: Secondary | ICD-10-CM | POA: Insufficient documentation

## 2015-09-08 DIAGNOSIS — M7661 Achilles tendinitis, right leg: Secondary | ICD-10-CM | POA: Diagnosis not present

## 2015-09-08 MED ORDER — VITAMIN D (ERGOCALCIFEROL) 1.25 MG (50000 UNIT) PO CAPS: 50000.0000 [IU] | ORAL_CAPSULE | ORAL | Status: DC

## 2015-09-08 MED ORDER — MELOXICAM 15 MG PO TABS: 15.0000 mg | ORAL_TABLET | Freq: Every day | ORAL | Status: DC

## 2015-09-08 NOTE — Progress Notes (Signed)
Pre visit review using our clinic review tool, if applicable. No additional management support is needed unless otherwise documented below in the visit note. 

## 2015-09-08 NOTE — Patient Instructions (Signed)
Good to see you  Xray downstairs today  Meloxicam daily for 10 days Once weekly vitamin D for next 12 weeks.  Continue the pennsaid Ice bath 10 minutes 1-2 times daily  Heel lift in shoe can help a lot See me again in 2-3 weeks to make sure doing all right.
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
## 2015-09-15 ENCOUNTER — Ambulatory Visit: Payer: BLUE CROSS/BLUE SHIELD | Admitting: Family Medicine

## 2015-09-16 ENCOUNTER — Other Ambulatory Visit: Payer: Self-pay | Admitting: *Deleted

## 2015-09-16 DIAGNOSIS — I10 Essential (primary) hypertension: Secondary | ICD-10-CM

## 2015-09-16 MED ORDER — AMLODIPINE BESYLATE 10 MG PO TABS: 10.0000 mg | ORAL_TABLET | Freq: Every day | ORAL | Status: DC

## 2015-09-16 NOTE — Telephone Encounter (Signed)
Received fax requesting refill on Glipizide.   Medication filled x1 with no refills.   Requires office visit before any further refills can be given.

## 2015-09-22 ENCOUNTER — Ambulatory Visit (INDEPENDENT_AMBULATORY_CARE_PROVIDER_SITE_OTHER): Payer: BLUE CROSS/BLUE SHIELD | Admitting: Family Medicine

## 2015-09-22 ENCOUNTER — Encounter: Payer: Self-pay | Admitting: Family Medicine

## 2015-09-22 VITALS — BP 128/78 | HR 72 | Temp 98.2°F | Resp 16 | Ht 63.0 in | Wt 242.0 lb

## 2015-09-22 DIAGNOSIS — E1122 Type 2 diabetes mellitus with diabetic chronic kidney disease: Secondary | ICD-10-CM | POA: Diagnosis not present

## 2015-09-22 DIAGNOSIS — N182 Chronic kidney disease, stage 2 (mild): Secondary | ICD-10-CM

## 2015-09-22 DIAGNOSIS — I10 Essential (primary) hypertension: Secondary | ICD-10-CM

## 2015-09-22 MED ORDER — ATORVASTATIN CALCIUM 40 MG PO TABS: 40.0000 mg | ORAL_TABLET | Freq: Every day | ORAL | Status: DC

## 2015-09-22 MED ORDER — FLUTICASONE PROPIONATE 50 MCG/ACT NA SUSP: 2.0000 | Freq: Every day | NASAL | Status: DC

## 2015-09-22 MED ORDER — AMLODIPINE BESYLATE 10 MG PO TABS
10.0000 mg | ORAL_TABLET | Freq: Every day | ORAL | Status: DC
Start: 2015-09-22 — End: 2016-05-23

## 2015-09-22 NOTE — Patient Instructions (Addendum)
Take the meloxicam with food Do not take naprosyn with the meloxicam  We will call with lab results F/U 4 months
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
## 2015-09-23 ENCOUNTER — Encounter: Payer: Self-pay | Admitting: Family Medicine

## 2015-09-23 LAB — BASIC METABOLIC PANEL
BUN: 12 mg/dL (ref 7–25)
CALCIUM: 9.5 mg/dL (ref 8.6–10.2)
CO2: 26 mmol/L (ref 20–31)
CREATININE: 1.06 mg/dL (ref 0.50–1.10)
Chloride: 105 mmol/L (ref 98–110)
GLUCOSE: 163 mg/dL — AB (ref 70–99)
Potassium: 4.2 mmol/L (ref 3.5–5.3)
SODIUM: 139 mmol/L (ref 135–146)

## 2015-09-23 LAB — HEMOGLOBIN A1C
Hgb A1c MFr Bld: 7.5 % — ABNORMAL HIGH (ref <5.7)
Mean Plasma Glucose: 169 mg/dL

## 2015-09-23 NOTE — Assessment & Plan Note (Signed)
Well controlled 

## 2015-09-23 NOTE — Assessment & Plan Note (Signed)
Recheck A1C and renal function goal is < 7%, previous 7.8% Work on nutrition, discussed myfitness pal app

## 2015-10-06 ENCOUNTER — Ambulatory Visit: Payer: BLUE CROSS/BLUE SHIELD | Admitting: Family Medicine

## 2015-10-12 ENCOUNTER — Ambulatory Visit: Payer: BLUE CROSS/BLUE SHIELD | Admitting: Family Medicine

## 2015-10-12 ENCOUNTER — Telehealth: Payer: Self-pay | Admitting: Family Medicine

## 2015-10-12 MED ORDER — SITAGLIPTIN PHOSPHATE 100 MG PO TABS: 100.0000 mg | ORAL_TABLET | Freq: Every day | ORAL | Status: DC

## 2015-10-12 NOTE — Telephone Encounter (Signed)
Pt is requesting a prescription of Januvia 100 mg be called in to the [facility] on [address]. She has been using samples from [facility].

## 2015-10-12 NOTE — Telephone Encounter (Signed)
Prescription sent to pharmacy.

## 2015-10-15 ENCOUNTER — Telehealth: Payer: Self-pay | Admitting: Family Medicine

## 2015-10-15 NOTE — Telephone Encounter (Signed)
Pt left a message stating that she will not be paying $30 for a Januvia prescription and she would like for Dr. Buelah Manis to call in a cheaper medication. Please advise [phone]

## 2015-10-15 NOTE — Telephone Encounter (Signed)
MD please advise

## 2015-10-16 NOTE — Telephone Encounter (Signed)
You can see if Tradjenta  5mg  Is cheaper and if we have a copay card for this or the [place]

## 2015-10-19 NOTE — Telephone Encounter (Signed)
Call placed to patient.   She has low deductible, high prescription plan.   [facility] will not save her any money.   Mailed co-pay card to patient.

## 2015-10-20 ENCOUNTER — Other Ambulatory Visit: Payer: Self-pay | Admitting: *Deleted

## 2015-10-20 MED ORDER — OMEPRAZOLE 40 MG PO CPDR: 40.0000 mg | DELAYED_RELEASE_CAPSULE | Freq: Every day | ORAL | Status: DC

## 2015-10-20 NOTE — Telephone Encounter (Signed)
Received fax requesting refill on Omeprazole.   Refill appropriate and filled per protocol. 

## 2015-10-28 ENCOUNTER — Encounter: Payer: Self-pay | Admitting: Family Medicine

## 2015-10-28 ENCOUNTER — Other Ambulatory Visit: Payer: Self-pay

## 2015-10-28 ENCOUNTER — Ambulatory Visit (INDEPENDENT_AMBULATORY_CARE_PROVIDER_SITE_OTHER): Payer: BLUE CROSS/BLUE SHIELD | Admitting: Family Medicine

## 2015-10-28 VITALS — BP 126/84 | HR 81 | Ht 63.0 in | Wt 240.0 lb

## 2015-10-28 DIAGNOSIS — M7661 Achilles tendinitis, right leg: Secondary | ICD-10-CM

## 2015-10-28 DIAGNOSIS — S93401A Sprain of unspecified ligament of right ankle, initial encounter: Secondary | ICD-10-CM | POA: Diagnosis not present

## 2015-10-28 NOTE — Progress Notes (Signed)
[name] [address], [place] [address] Phone: [phone] Subjective:     CC: right foot and ankle pain follow-up  [id number] [name] is a 45 y.o. female coming in with complaint of right ankle pain. Patient was seen previously and had more of an Achilles tendinosis. Patient did have some calcific changes as well as some very mild intersubstance tearing. Patient states that she is feeling 60% better. Has been doing the exercises, icing, and the home exercises. Patient is very happy with the results of far. No new symptoms.  Past Medical History  Diagnosis Date  . Allergy     seasonal  . Obesity   . Hemorrhoids   . Abnormal Pap smear   . Plantar fasciitis   . Atrial fibrillation ([place])   . Diabetes mellitus 2012  . GERD (gastroesophageal reflux disease)   . Hypertension 1995  . Hyperlipidemia 2015  . Sickle cell trait ([place]) 1995  . Sickle cell anemia (HCC)     sickle cell trait   Past Surgical History  Procedure Laterality Date  . Abdominal hysterectomy  2005    for Fibroids  . Iliac vein angioplasty / stenting    . Cesarean section    . Tubal ligation    . Skin tag removal  Sept. 25, 2015    Right uppper arm  . Venogram N/A 11/07/2012    Procedure: VENOGRAM;  Surgeon: Serafina Mitchell, MD;  Location: [facility] CATH LAB;  Service: Cardiovascular;  Laterality: N/A;  . Venogram N/A 04/15/2014    Procedure: VENOGRAM;  Surgeon: Serafina Mitchell, MD;  Location: [facility] CATH LAB;  Service: Cardiovascular;  Laterality: N/A;   Family History  Problem Relation Age of Onset  . Diabetes Mother   . Hypertension Mother   . Hyperlipidemia Mother   . Heart disease Sister     [employer]  . Hearing loss Sister   . Learning disabilities Sister   . Heart attack Neg Hx   . Sudden death Neg Hx   . Colon cancer Neg Hx   . Esophageal cancer Neg Hx   . Rectal cancer Neg Hx   . Stomach cancer Neg Hx   . Hypertension Other   . Learning  disabilities Daughter   . Asthma Maternal Aunt   . Diabetes Maternal Aunt   . Hyperlipidemia Maternal Aunt   . Hypertension Maternal Aunt   . Arthritis Maternal Grandmother   . Stroke Maternal Grandmother    Social History  Substance Use Topics  . Smoking status: Never Smoker   . Smokeless tobacco: Never Used  . Alcohol Use: No   Allergies  Allergen Reactions  . Other     Pt. States every time she is "put to sleep" she because becomes nauseated and runs a fever"  . Tape Rash        Past medical history, social, surgical and family history all reviewed in electronic medical record.   Review of Systems: No headache, visual changes, nausea, vomiting, diarrhea, constipation, dizziness, abdominal pain, skin rash, fevers, chills, night sweats, weight loss, swollen lymph nodes, body aches, joint swelling, muscle aches, chest pain, shortness of breath, mood changes.   Objective Blood pressure 126/84, pulse 81, height 5\' 3"  (1.6 m), weight 240 lb (108.863 kg), SpO2 95 %.  General: No apparent distress alert and oriented x3 mood and affect normal, dressed appropriately.  HEENT: Pupils equal, extraocular movements intact  Respiratory: Patient's speak in full sentences  and does not appear short of breath  Cardiovascular: No lower extremity edema, non tender, no erythema  Skin: Warm dry intact with no signs of infection or rash on extremities or on axial skeleton.  Abdomen: Soft nontender  Neuro: Cranial nerves II through XII are intact, neurovascularly intact in all extremities with 2+ DTRs and 2+ pulses.  Lymph: No lymphadenopathy of posterior or anterior cervical chain or axillae bilaterally.  Gait normal with good balance and coordination.  MSK:  Non tender with full range of motion and good stability and symmetric strength and tone of shoulders, elbows, wrist, hip, knee bilaterally.  Foot exam shows patient does have significant pes planus bilaterally.   Ankle: Right No visible  erythema or swelling. Range of motion is full in all directions. Strength noted today which is an improvement Stable lateral and medial ligaments; squeeze test and kleiger test unremarkable; tender over the ATFL Talar dome nontender; No pain at base of 5th MT; No tenderness over cuboid; No tenderness over N spot or navicular prominence No tenderness on posterior aspects of lateral and medial malleolus nontender over the peroneal's Minimal tenderness over the Achilles Negative tarsal tunnel tinel's Able to walk 4 steps.  Contralateral ankle unremarkable   MSK US performed of: right ankle This study was ordered, performed, and interpreted by Charlann Boxer D.O.  Foot/Ankle:   All structures visualized.   Talar dome unremarkable  Ankle mortise without effusion. Peroneus longus and brevis tendons unremarkable on long and transverse views without sheath effusions. Posterior tibialis, flexor hallucis longus, and flexor digitorum longus tendons unremarkable on long and transverse views without sheath effusions. Achilles tendon does have significant decrease in hypoechoic changes. Intersubstance tearing condition scar tissue formation. Very mild calcific changes at the insertion.   IMPRESSION:  Achilles tendinosis with improvement improvement in calcific changes     Impression and Recommendations:     This case required medical decision making of moderate complexity.

## 2015-10-28 NOTE — Patient Instructions (Signed)
Thanks for working hard.  Ice is your friend Keep doing the heel lift.  Exercises will be great still 2-3 times a week  For the toes consider Tenactin or lamisil would be great  See me again in 6 weeks

## 2015-10-28 NOTE — Assessment & Plan Note (Signed)
Patient is making significant improvement at this time. We discussed icing regimen, we discussed continuing the conservative therapy. Tenderness and any signs of worsening tearing. Patient will follow-up in 6 weeks for further evaluation. No significant change in management.

## 2015-10-28 NOTE — Progress Notes (Signed)
Pre visit review using our clinic review tool, if applicable. No additional management support is needed unless otherwise documented below in the visit note. 

## 2015-11-11 ENCOUNTER — Ambulatory Visit (INDEPENDENT_AMBULATORY_CARE_PROVIDER_SITE_OTHER): Payer: BLUE CROSS/BLUE SHIELD | Admitting: Family Medicine

## 2015-11-11 ENCOUNTER — Encounter: Payer: Self-pay | Admitting: Family Medicine

## 2015-11-11 VITALS — BP 146/100 | HR 76 | Temp 98.8°F | Resp 18 | Ht 62.0 in | Wt 245.0 lb

## 2015-11-11 DIAGNOSIS — N182 Chronic kidney disease, stage 2 (mild): Secondary | ICD-10-CM

## 2015-11-11 DIAGNOSIS — Z0000 Encounter for general adult medical examination without abnormal findings: Secondary | ICD-10-CM | POA: Diagnosis not present

## 2015-11-11 DIAGNOSIS — E669 Obesity, unspecified: Secondary | ICD-10-CM | POA: Diagnosis not present

## 2015-11-11 DIAGNOSIS — E785 Hyperlipidemia, unspecified: Secondary | ICD-10-CM | POA: Diagnosis not present

## 2015-11-11 DIAGNOSIS — E1122 Type 2 diabetes mellitus with diabetic chronic kidney disease: Secondary | ICD-10-CM | POA: Diagnosis not present
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
## 2015-11-11 NOTE — Assessment & Plan Note (Signed)
Diabetes is much improved A1c is 7.5% continue current regimen He does not have the finances to schedule my exam right now

## 2015-11-11 NOTE — Patient Instructions (Signed)
F/U 3 months Goal of 10lb weight loss We will call with lab results

## 2015-11-11 NOTE — Assessment & Plan Note (Signed)
As does healthy eating weight loss goal 10 pounds that by next visit in 3 months The importance of eating regular meals throughout the day or protein less carbs avoid sugar cut out soda

## 2015-11-12 LAB — CBC WITH DIFFERENTIAL/PLATELET
BASOS PCT: 0 %
Basophils Absolute: 0 cells/uL (ref 0–200)
EOS ABS: 213 {cells}/uL (ref 15–500)
EOS PCT: 3 %
HCT: 41.4 % (ref 35.0–45.0)
Hemoglobin: 13.7 g/dL (ref 12.0–15.0)
LYMPHS PCT: 38 %
Lymphs Abs: 2698 cells/uL (ref 850–3900)
MCH: 27.3 pg (ref 27.0–33.0)
MCHC: 33.1 g/dL (ref 32.0–36.0)
MCV: 82.6 fL (ref 80.0–100.0)
MONOS PCT: 9 %
MPV: 10.7 fL (ref 7.5–12.5)
Monocytes Absolute: 639 cells/uL (ref 200–950)
NEUTROS ABS: 3550 {cells}/uL (ref 1500–7800)
Neutrophils Relative %: 50 %
PLATELETS: 269 10*3/uL (ref 140–400)
RBC: 5.01 MIL/uL (ref 3.80–5.10)
RDW: 14.5 % (ref 11.0–15.0)
WBC: 7.1 10*3/uL (ref 3.8–10.8)

## 2015-11-12 LAB — LIPID PANEL
CHOL/HDL RATIO: 3.9 ratio (ref <=5.0)
Cholesterol: 140 mg/dL (ref 125–200)
HDL: 36 mg/dL — ABNORMAL LOW (ref >=46)
LDL Cholesterol: 82 mg/dL (ref <130)
Triglycerides: 111 mg/dL (ref <150)
VLDL: 22 mg/dL (ref <30)

## 2015-11-12 LAB — COMPREHENSIVE METABOLIC PANEL
ALK PHOS: 72 U/L (ref 33–115)
ALT: 13 U/L (ref 6–29)
AST: 12 U/L (ref 10–35)
Albumin: 3.9 g/dL (ref 3.6–5.1)
BUN: 11 mg/dL (ref 7–25)
CHLORIDE: 104 mmol/L (ref 98–110)
CO2: 23 mmol/L (ref 20–31)
CREATININE: 1.02 mg/dL (ref 0.50–1.10)
Calcium: 9.4 mg/dL (ref 8.6–10.2)
GLUCOSE: 107 mg/dL — AB (ref 70–99)
POTASSIUM: 4.1 mmol/L (ref 3.5–5.3)
SODIUM: 139 mmol/L (ref 135–146)
TOTAL PROTEIN: 7.2 g/dL (ref 6.1–8.1)
Total Bilirubin: 0.5 mg/dL (ref 0.2–1.2)

## 2015-12-06 ENCOUNTER — Other Ambulatory Visit: Payer: Self-pay | Admitting: Family Medicine

## 2015-12-07 NOTE — Telephone Encounter (Signed)
Refill appropriate and filled per protocol. 

## 2015-12-21 NOTE — Progress Notes (Signed)
[name] [address], [place] [address] Phone: [phone] Subjective:     CC: right foot and ankle pain follow-up  [id number]  [name] is a 45 y.o. female coming in with complaint of right ankle pain. Patient was found to have more of a calcific Achilles tendinosis. We discussed icing regimen and home exercises. Patient has been doing that as well as he'll let's. Has made significant progress. States that she is 95% better.  Past Medical History:  Diagnosis Date  . Abnormal Pap smear   . Allergy    seasonal  . Atrial fibrillation ([place])   . Diabetes mellitus 2012  . GERD (gastroesophageal reflux disease)   . Hemorrhoids   . Hyperlipidemia 2015  . Hypertension 1995  . Obesity   . Plantar fasciitis   . Sickle cell anemia (HCC)    sickle cell trait  . Sickle cell trait ([place]) 1995   Past Surgical History:  Procedure Laterality Date  . ABDOMINAL HYSTERECTOMY  2005   for Fibroids  . CESAREAN SECTION    . ILIAC VEIN ANGIOPLASTY / STENTING    . SKIN TAG REMOVAL  Sept. 25, 2015   Right uppper arm  . TUBAL LIGATION    . VENOGRAM N/A 11/07/2012   Procedure: VENOGRAM;  Surgeon: Serafina Mitchell, MD;  Location: [facility] CATH LAB;  Service: Cardiovascular;  Laterality: N/A;  . VENOGRAM N/A 04/15/2014   Procedure: VENOGRAM;  Surgeon: Serafina Mitchell, MD;  Location: [facility] CATH LAB;  Service: Cardiovascular;  Laterality: N/A;   Family History  Problem Relation Age of Onset  . Diabetes Mother   . Hypertension Mother   . Hyperlipidemia Mother   . Heart disease Sister     [employer]  . Hearing loss Sister   . Learning disabilities Sister   . Heart attack Neg Hx   . Sudden death Neg Hx   . Colon cancer Neg Hx   . Esophageal cancer Neg Hx   . Rectal cancer Neg Hx   . Stomach cancer Neg Hx   . Hypertension Other   . Learning disabilities Daughter   . Asthma Maternal Aunt   . Diabetes Maternal Aunt   . Hyperlipidemia Maternal Aunt    . Hypertension Maternal Aunt   . Arthritis Maternal Grandmother   . Stroke Maternal Grandmother    Social History  Substance Use Topics  . Smoking status: Never Smoker  . Smokeless tobacco: Never Used  . Alcohol use No   Allergies  Allergen Reactions  . Other     Pt. States every time she is "put to sleep" she because becomes nauseated and runs a fever"  . Tape Rash        Past medical history, social, surgical and family history all reviewed in electronic medical record.   Review of Systems: No headache, visual changes, nausea, vomiting, diarrhea, constipation, dizziness, abdominal pain, skin rash, fevers, chills, night sweats, weight loss, swollen lymph nodes, body aches, joint swelling, muscle aches, chest pain, shortness of breath, mood changes.   Objective  Blood pressure 120/82, pulse (!) 106, weight 246 lb (111.6 kg), SpO2 99 %.  General: No apparent distress alert and oriented x3 mood and affect normal, dressed appropriately.  HEENT: Pupils equal, extraocular movements intact  Respiratory: Patient's speak in full sentences and does not appear short of breath  Cardiovascular: No lower extremity edema, non tender, no erythema  Skin: Warm dry intact with no signs of  infection or rash on extremities or on axial skeleton.  Abdomen: Soft nontender  Neuro: Cranial nerves II through XII are intact, neurovascularly intact in all extremities with 2+ DTRs and 2+ pulses.  Lymph: No lymphadenopathy of posterior or anterior cervical chain or axillae bilaterally.  Gait normal with good balance and coordination.  MSK:  Non tender with full range of motion and good stability and symmetric strength and tone of shoulders, elbows, wrist, hip, knee bilaterally.  Foot exam shows patient does have significant pes planus bilaterally.   Ankle: Right No visible erythema or swelling. Range of motion is full in all directions. Strength noted today which is an improvement Stable lateral and  medial ligaments; squeeze test and kleiger test unremarkable; tender over the ATFL Talar dome nontender; No pain at base of 5th MT; No tenderness over cuboid; No tenderness over N spot or navicular prominence No tenderness on posterior aspects of lateral and medial malleolus nontender over the peroneal's Nontender over the Achilles. Negative tarsal tunnel tinel's Able to walk 4 steps.  Contralateral ankle unremarkable Improvement from previous exam.      Impression and Recommendations:     This case required medical decision making of moderate complexity.

## 2015-12-22 ENCOUNTER — Ambulatory Visit (INDEPENDENT_AMBULATORY_CARE_PROVIDER_SITE_OTHER): Payer: BLUE CROSS/BLUE SHIELD | Admitting: Family Medicine

## 2015-12-22 ENCOUNTER — Encounter: Payer: Self-pay | Admitting: Family Medicine

## 2015-12-22 DIAGNOSIS — M7661 Achilles tendinitis, right leg: Secondary | ICD-10-CM

## 2015-12-22 MED ORDER — VITAMIN D (ERGOCALCIFEROL) 1.25 MG (50000 UNIT) PO CAPS: 50000.0000 [IU] | ORAL_CAPSULE | ORAL | 0 refills | Status: DC

## 2015-12-22 NOTE — Patient Instructions (Signed)
Good to see you  Ice is your friend Continue the heel lift ([email or web address]) Another round of 12 weeks of vitamin D See me again when you need me.

## 2015-12-22 NOTE — Assessment & Plan Note (Signed)
Patient is doing better overall. We discussed icing regimen. We discussed home exercises. Patient will continue with the vitamin D supplementation did seem to make improvement. Patient did well she can follow-up on an as-needed basis.

## 2016-01-05 ENCOUNTER — Other Ambulatory Visit: Payer: Self-pay | Admitting: Family Medicine

## 2016-01-05 DIAGNOSIS — I10 Essential (primary) hypertension: Secondary | ICD-10-CM

## 2016-01-05 NOTE — Telephone Encounter (Signed)
Medication refilled per protocol. 

## 2016-01-16 ENCOUNTER — Other Ambulatory Visit: Payer: Self-pay | Admitting: Family Medicine

## 2016-01-16 DIAGNOSIS — I10 Essential (primary) hypertension: Secondary | ICD-10-CM

## 2016-04-12 ENCOUNTER — Telehealth: Payer: Self-pay | Admitting: *Deleted

## 2016-04-12 ENCOUNTER — Ambulatory Visit (INDEPENDENT_AMBULATORY_CARE_PROVIDER_SITE_OTHER): Payer: BLUE CROSS/BLUE SHIELD | Admitting: Family Medicine

## 2016-04-12 ENCOUNTER — Ambulatory Visit (INDEPENDENT_AMBULATORY_CARE_PROVIDER_SITE_OTHER): Payer: BLUE CROSS/BLUE SHIELD

## 2016-04-12 ENCOUNTER — Other Ambulatory Visit: Payer: Self-pay | Admitting: Family Medicine

## 2016-04-12 VITALS — BP 128/90 | HR 108 | Temp 98.6°F | Resp 20 | Ht 63.5 in | Wt 247.4 lb

## 2016-04-12 DIAGNOSIS — B353 Tinea pedis: Secondary | ICD-10-CM | POA: Diagnosis not present

## 2016-04-12 DIAGNOSIS — E1165 Type 2 diabetes mellitus with hyperglycemia: Secondary | ICD-10-CM

## 2016-04-12 DIAGNOSIS — E118 Type 2 diabetes mellitus with unspecified complications: Secondary | ICD-10-CM

## 2016-04-12 DIAGNOSIS — Z794 Long term (current) use of insulin: Secondary | ICD-10-CM

## 2016-04-12 DIAGNOSIS — S90424A Blister (nonthermal), right lesser toe(s), initial encounter: Secondary | ICD-10-CM | POA: Diagnosis not present

## 2016-04-12 LAB — POCT CBC
GRANULOCYTE PERCENT: 58.1 % (ref 37–80)
HEMATOCRIT: 41 % (ref 37.7–47.9)
HEMOGLOBIN: 13.6 g/dL (ref 12.2–16.2)
Lymph, poc: 3 (ref 0.6–3.4)
MCH: 28 pg (ref 27–31.2)
MCHC: 33.3 g/dL (ref 31.8–35.4)
MCV: 84.2 fL (ref 80–97)
MID (CBC): 0.6 (ref 0–0.9)
MPV: 8.4 fL (ref 0–99.8)
POC GRANULOCYTE: 5 (ref 2–6.9)
POC LYMPH PERCENT: 35.2 %L (ref 10–50)
POC MID %: 6.7 % (ref 0–12)
Platelet Count, POC: 269 10*3/uL (ref 142–424)
RBC: 4.86 M/uL (ref 4.04–5.48)
RDW, POC: 14.3 %
WBC: 8.6 10*3/uL (ref 4.6–10.2)

## 2016-04-12 LAB — POCT SEDIMENTATION RATE: POCT SED RATE: 30 mm/hr — AB (ref 0–22)

## 2016-04-12 LAB — GLUCOSE, POCT (MANUAL RESULT ENTRY): POC GLUCOSE: 215 mg/dL — AB (ref 70–99)

## 2016-04-12 LAB — POCT GLYCOSYLATED HEMOGLOBIN (HGB A1C): Hemoglobin A1C: 9.7

## 2016-04-12 MED ORDER — SULFAMETHOXAZOLE-TRIMETHOPRIM 800-160 MG PO TABS: 1.0000 | ORAL_TABLET | Freq: Two times a day (BID) | ORAL | 0 refills | Status: DC

## 2016-04-12 MED ORDER — METFORMIN HCL ER 500 MG PO TB24: 2000.0000 mg | ORAL_TABLET | Freq: Every day | ORAL | 2 refills | Status: DC

## 2016-04-12 MED ORDER — MUPIROCIN 2 % EX OINT: 1.0000 "application " | TOPICAL_OINTMENT | Freq: Three times a day (TID) | CUTANEOUS | 1 refills | Status: DC

## 2016-04-12 NOTE — Patient Instructions (Addendum)
   IF you received an x-ray today, you will receive an invoice from [facility]. Please contact [facility] at [phone] with questions or concerns regarding your invoice.   IF you received labwork today, you will receive an invoice from [facility]. Please contact [facility] at [phone] with questions or concerns regarding your invoice.   Our billing staff will not be able to assist you with questions regarding bills from these companies.  You will be contacted with the lab results as soon as they are available. The fastest way to get your results is to activate your My Chart account. Instructions are located on the last page of this paperwork. If you have not heard from us regarding the results in 2 weeks, please contact this office.      Diabetes and Foot Care Diabetes may cause you to have problems because of poor blood supply (circulation) to your feet and legs. This may cause the skin on your feet to become thinner, break easier, and heal more slowly. Your skin may become dry, and the skin may peel and crack. You may also have nerve damage in your legs and feet causing decreased feeling in them. You may not notice minor injuries to your feet that could lead to infections or more serious problems. Taking care of your feet is one of the most important things you can do for yourself. Follow these instructions at home:  Wear shoes at all times, even in the house. Do not go barefoot. Bare feet are easily injured.  Check your feet daily for blisters, cuts, and redness. If you cannot see the bottom of your feet, use a mirror or ask someone for help.  Wash your feet with warm water (do not use hot water) and mild soap. Then pat your feet and the areas between your toes until they are completely dry. Do not soak your feet as this can dry your skin.  Apply a moisturizing lotion or petroleum jelly (that does not contain alcohol and is unscented) to the skin on your feet and  to dry, brittle toenails. Do not apply lotion between your toes.  Trim your toenails straight across. Do not dig under them or around the cuticle. File the edges of your nails with an emery board or nail file.  Do not cut corns or calluses or try to remove them with medicine.  Wear clean socks or stockings every day. Make sure they are not too tight. Do not wear knee-high stockings since they may decrease blood flow to your legs.  Wear shoes that fit properly and have enough cushioning. To break in new shoes, wear them for just a few hours a day. This prevents you from injuring your feet. Always look in your shoes before you put them on to be sure there are no objects inside.  Do not cross your legs. This may decrease the blood flow to your feet.  If you find a minor scrape, cut, or break in the skin on your feet, keep it and the skin around it clean and dry. These areas may be cleansed with mild soap and water. Do not cleanse the area with peroxide, alcohol, or iodine.  When you remove an adhesive bandage, be sure not to damage the skin around it.  If you have a wound, look at it several times a day to make sure it is healing.  Do not use heating pads or hot water bottles. They may burn your skin. If you have lost   feeling in your feet or legs, you may not know it is happening until it is too late.  Make sure your health care provider performs a complete foot exam at least annually or more often if you have foot problems. Report any cuts, sores, or bruises to your health care provider immediately. Contact a health care provider if:  You have an injury that is not healing.  You have cuts or breaks in the skin.  You have an ingrown nail.  You notice redness on your legs or feet.  You feel burning or tingling in your legs or feet.  You have pain or cramps in your legs and feet.  Your legs or feet are numb.  Your feet always feel cold. Get help right away if:  There is increasing  redness, swelling, or pain in or around a wound.  There is a red line that goes up your leg.  Pus is coming from a wound.  You develop a fever or as directed by your health care provider.  You notice a bad smell coming from an ulcer or wound. This information is not intended to replace advice given to you by your health care provider. Make sure you discuss any questions you have with your health care provider. Document Released: 04/08/2000 Document Revised: 09/17/2015 Document Reviewed: 09/18/2012 Elsevier Interactive Patient Education  2017 [employer]  

## 2016-04-12 NOTE — Progress Notes (Signed)
Subjective:  By signing my name below, I, [name], attest that this documentation has been prepared under the direction and in the presence of Delman Cheadle, MD Electronically Signed: Ladene Artist, ED Scribe 04/12/2016 at 2:32 PM.   Patient ID: [name], female    DOB: 03/18/1971, 45 y.o.   MRN: [id number]  Chief Complaint  Patient presents with  . Toe Pain    Rt 2-nd toe pain / blisters  x 3 days   HPI HPI Comments: [name] is a 45 y.o. female who presents to the [facility] complaining of right second toe pain onset 3 days ago. Pt has a h/o Type II DM and her last A1C 6 months prior was 7.5. Pt also noticed a non-draining blisters to the right second toe 3 days ago. She admits to not checking her blood glucose regularly but when she does, she reports averages of 180-200. Pt denies wearing new shoes or socks. She reports a similar blister with pus-like drainage to the right third toe last year that required lancing and antibiotic treatment.    Past Medical History:  Diagnosis Date  . Abnormal Pap smear   . Allergy    seasonal  . Atrial fibrillation ([employer])   . Diabetes mellitus 2012  . GERD (gastroesophageal reflux disease)   . Hemorrhoids   . Hyperlipidemia 2015  . Hypertension 1995  . Obesity   . Plantar fasciitis   . Sickle cell anemia (HCC)    sickle cell trait  . Sickle cell trait ([place]) 1995   Current Outpatient Prescriptions on File Prior to Visit  Medication Sig Dispense Refill  . acetaminophen (TYLENOL) 650 MG CR tablet Take 650 mg by mouth every 8 (eight) hours as needed for pain. Reported on 07/28/2015    . amLODipine (NORVASC) 10 MG tablet Take 1 tablet (10 mg total) by mouth daily. 90 tablet 2  . aspirin 81 MG EC tablet Take 81 mg by mouth daily.     Marland Kitchen atorvastatin (LIPITOR) 40 MG tablet Take 1 tablet (40 mg total) by mouth daily. 90 tablet 3  . blood glucose meter kit and supplies Please dispense One Touch Ultra. Use to monitor FSBS 2x  daily. Dx: E11.9. 1 each 0  . clotrimazole (LOTRIMIN) 1 % cream Apply 1 application topically 2 (two) times daily. 45 g 0  . Diclofenac Sodium 2 % SOLN Apply 1 pump twice daily. 112 g 3  . fluticasone (FLONASE) 50 MCG/ACT nasal spray Place 2 sprays into both nostrils daily. 16 g 2  . glimepiride (AMARYL) 2 MG tablet take 1 tablet once daily 30 tablet 6  . glucose blood (ONE TOUCH ULTRA TEST) test strip Use to monitor FSBS 2x daily. Dx E11.9 100 each 12  . hydrochlorothiazide (HYDRODIURIL) 25 MG tablet Take 1 tablet (25 mg total) by mouth daily. 90 tablet 3  . JANUVIA 100 MG tablet take 1 tablet by mouth once daily 30 tablet 1  . Lancet Devices (AUTO-LANCETS) MISC Test blood sugar as directed  Dx 250.00 100 each 12  . Lancets MISC Use to monitor FSBS 2x daily. Dx E11.9 100 each 12  . lisinopril (PRINIVIL,ZESTRIL) 40 MG tablet take 1 tablet by mouth once daily 90 tablet 0  . meloxicam (MOBIC) 15 MG tablet Take 1 tablet (15 mg total) by mouth daily. 30 tablet 0  . metFORMIN (GLUCOPHAGE-XR) 500 MG 24 hr tablet Take 2 tablets (1,000 mg total) by mouth daily with breakfast. 60 tablet  2  . naproxen (NAPROSYN) 500 MG tablet Take 1 tablet (500 mg total) by mouth 2 (two) times daily. 30 tablet 0  . nitroGLYCERIN (NITROGLYN) 2 % ointment Apply to rectal area twice a day 30 g 0  . omeprazole (PRILOSEC) 40 MG capsule Take 1 capsule (40 mg total) by mouth daily. 30 capsule 6  . Vitamin D, Ergocalciferol, (DRISDOL) 50000 units CAPS capsule Take 1 capsule (50,000 Units total) by mouth every 7 (seven) days. 12 capsule 0   No current facility-administered medications on file prior to visit.    Allergies  Allergen Reactions  . Other     Pt. States every time she is "put to sleep" she because becomes nauseated and runs a fever"  . Tape Rash   Review of Systems  Constitutional: Negative for activity change, appetite change, chills, diaphoresis and fever.  Gastrointestinal: Negative for nausea and vomiting.    Musculoskeletal: Positive for arthralgias and joint swelling. Negative for gait problem.  Skin: Positive for wound. Negative for color change, pallor and rash.       +blisters to R second toe  Neurological: Positive for numbness. Negative for weakness.  Hematological: Negative for adenopathy. Does not bruise/bleed easily.      Objective:   Physical Exam  Constitutional: She is oriented to person, place, and time. She appears well-developed and well-nourished. No distress.  HENT:  Head: Normocephalic and atraumatic.  Eyes: Conjunctivae and EOM are normal.  Neck: Neck supple. No tracheal deviation present.  Cardiovascular: Normal rate.   Pulmonary/Chest: Effort normal. No respiratory distress.  Musculoskeletal: Normal range of motion.  Neurological: She is alert and oriented to person, place, and time.  Skin: Skin is warm and dry.  Normal sensation to light touch. No significant induration or fluctuant palpable. 2 small 5 x 1 cm and 3 x 5 cm blister over the DIP oint of the R 2nd toe. Tinea pedis and athlete's foot on the L.   Psychiatric: She has a normal mood and affect. Her behavior is normal.  Nursing note and vitals reviewed.  BP (!) 156/100 (BP Location: Right Arm, Patient Position: Sitting, Cuff Size: Large)   Pulse (!) 108   Temp 98.6 F (37 C) (Oral)   Resp 20   Ht 5' 3.5" (1.613 m)   Wt 247 lb 6.4 oz (112.2 kg)   SpO2 98%   BMI 43.14 kg/m     Dg Toe 2nd Right  Result Date: 04/12/2016 CLINICAL DATA:  Unable to move to for lateral view patient said it was to painful spotaneous blisters over DIP, nki, h/o abscess on 3rd toe last year, uncontrolled type 2 DM EXAM: RIGHT SECOND TOE COMPARISON:  None. FINDINGS: There is no evidence of fracture or dislocation. There is no evidence of arthropathy or other focal bone abnormality. Soft tissues are unremarkable. IMPRESSION: Negative. Electronically Signed   By: Lucrezia Europe M.D.   On: 04/12/2016 15:13   Results for orders placed  or performed in visit on 04/12/16  POCT glycosylated hemoglobin (Hb A1C)  Result Value Ref Range   Hemoglobin A1C 9.7   POCT glucose (manual entry)  Result Value Ref Range   POC Glucose 215 (A) 70 - 99 mg/dl  POCT CBC  Result Value Ref Range   WBC 8.6 4.6 - 10.2 K/uL   Lymph, poc 3.0 0.6 - 3.4   POC LYMPH PERCENT 35.2 10 - 50 %L   MID (cbc) 0.6 0 - 0.9   POC MID % 6.7  0 - 12 %M   POC Granulocyte 5.0 2 - 6.9   Granulocyte percent 58.1 37 - 80 %G   RBC 4.86 4.04 - 5.48 M/uL   Hemoglobin 13.6 12.2 - 16.2 g/dL   HCT, POC 41.0 37.7 - 47.9 %   MCV 84.2 80 - 97 fL   MCH, POC 28.0 27 - 31.2 pg   MCHC 33.3 31.8 - 35.4 g/dL   RDW, POC 14.3 %   Platelet Count, POC 269 142 - 424 K/uL   MPV 8.4 0 - 99.8 fL   Assessment & Plan:   1. Blister (nonthermal), right lesser toe(s), initial encounter - no sign of cellulitis on exam however due to uncontrolled DM and concern of neuropathy will cover with bactrim, close f/u  2. Tinea pedis, left   3. Type 2 diabetes mellitus with hyperglycemia, without long-term current use of insulin ([place]) - resume metformin and f/u w/ PCP asap.  4. Type 2 diabetes mellitus with complication, with long-term current use of insulin ([place])     Orders Placed This Encounter  Procedures  . DG Toe 2nd Right    Standing Status:   Future    Number of Occurrences:   1    Standing Expiration Date:   04/12/2017    Order Specific Question:   Reason for Exam (SYMPTOM  OR DIAGNOSIS REQUIRED)    Answer:   spotaneous blisters over DIP, nki, h/o abscess on 3rd toe last year, uncontrolled type 2 DM    Order Specific Question:   Is the patient pregnant?    Answer:   No    Order Specific Question:   Preferred imaging location?    Answer:   External  . POCT SEDIMENTATION RATE  . POCT glycosylated hemoglobin (Hb A1C)  . POCT glucose (manual entry)  . POCT CBC    Meds ordered this encounter  Medications  . sulfamethoxazole-trimethoprim (BACTRIM DS,SEPTRA DS) 800-160 MG tablet     Sig: Take 1 tablet by mouth 2 (two) times daily.    Dispense:  14 tablet    Refill:  0  . mupirocin ointment (BACTROBAN) 2 %    Sig: Apply 1 application topically 3 (three) times daily.    Dispense:  22 g    Refill:  1  . metFORMIN (GLUCOPHAGE-XR) 500 MG 24 hr tablet    Sig: Take 4 tablets (2,000 mg total) by mouth daily with breakfast.    Dispense:  120 tablet    Refill:  2    I personally performed the services described in this documentation, which was scribed in my presence. The recorded information has been reviewed and considered, and addended by me as needed.   Delman Cheadle, M.D.  [facility] [address] [place], [place] [address] [phone] phone [phone] fax  04/25/16 10:10 PM

## 2016-04-12 NOTE — Telephone Encounter (Signed)
Schedule OV for this

## 2016-04-12 NOTE — Telephone Encounter (Signed)
Received call from patient.   Reports that she has been seen in UC for a blister to her toe.   States that A1C had been obtained and noted at 9.7.  UC recommended increasing Metformin to 4 tablets (2,000 mg total) by mouth daily with breakfast.  Patient also states that she is having difficulty with weight loss and has signed up to join a gym, but would like something to kick-start her weight loss.   MD please advise.

## 2016-04-13 NOTE — Telephone Encounter (Signed)
Call placed to patient and patient made aware per VM.  

## 2016-04-20 ENCOUNTER — Encounter: Payer: Self-pay | Admitting: Family Medicine

## 2016-04-20 ENCOUNTER — Ambulatory Visit (INDEPENDENT_AMBULATORY_CARE_PROVIDER_SITE_OTHER): Payer: BLUE CROSS/BLUE SHIELD | Admitting: Family Medicine

## 2016-04-20 VITALS — BP 110/76 | HR 88 | Temp 100.2°F | Ht 63.5 in | Wt 244.0 lb

## 2016-04-20 DIAGNOSIS — J029 Acute pharyngitis, unspecified: Secondary | ICD-10-CM

## 2016-04-20 DIAGNOSIS — E118 Type 2 diabetes mellitus with unspecified complications: Secondary | ICD-10-CM

## 2016-04-20 DIAGNOSIS — Z794 Long term (current) use of insulin: Secondary | ICD-10-CM | POA: Diagnosis not present

## 2016-04-20 DIAGNOSIS — I10 Essential (primary) hypertension: Secondary | ICD-10-CM

## 2016-04-20 DIAGNOSIS — R21 Rash and other nonspecific skin eruption: Secondary | ICD-10-CM

## 2016-04-20 DIAGNOSIS — E1165 Type 2 diabetes mellitus with hyperglycemia: Secondary | ICD-10-CM | POA: Diagnosis not present

## 2016-04-20 LAB — STREP GROUP A AG, W/REFLEX TO CULT: STREGTOCOCCUS GROUP A AG SCREEN: NOT DETECTED

## 2016-04-20 MED ORDER — LIRAGLUTIDE 18 MG/3ML ~~LOC~~ SOPN: PEN_INJECTOR | SUBCUTANEOUS | 2 refills | Status: DC

## 2016-04-20 MED ORDER — METFORMIN HCL ER 500 MG PO TB24: 1000.0000 mg | ORAL_TABLET | Freq: Every day | ORAL | 2 refills | Status: DC

## 2016-04-20 MED ORDER — METHYLPREDNISOLONE ACETATE 80 MG/ML IJ SUSP
40.0000 mg | Freq: Once | INTRAMUSCULAR | Status: AC
  Administered 2016-04-20: 40 mg via INTRAMUSCULAR

## 2016-04-20 NOTE — Assessment & Plan Note (Signed)
A1c uncontrolled secondary to noncompliance of medications. She does have the obesity in the setting of her diabetes will try her on Victoza injectable. Discontinue the sulfonylurea she will restart metformin but she is to take 1000 mg extended release once a day. Start Victoza 0.6 mg increase to 1.2 mg after a week. We'll follow up in office in 4 weeks.

## 2016-04-20 NOTE — Patient Instructions (Signed)
Stop the amaryl Stop the Bactrim Start victoza take 0.6mg  for 1 week, each day, then go to 1.2mg  Steroid shot given today  Take benadryl for the rash Call if it does not improve You have low grade fever may be beginning of viral illness, your strep swab was negative F/U 1 month

## 2016-04-20 NOTE — Assessment & Plan Note (Signed)
Controlled no change to meds 

## 2016-04-20 NOTE — Progress Notes (Signed)
Subjective:    Patient ID: [name], female    DOB: 13-Feb-1971, 45 y.o.   MRN: [id number]  Patient presents for Follow-up  She here for follow-up. She was seen in urgent care secondary to a blister on her foot. They did an x-ray which was negative she was given Bactrim secondary to her blood sugar being extremely high. She has taken 6 days of antibiotics, never had redness or drainage. An A1c was also done which resulted at 9.7%. She admits that she was not taking her metformin because she did not like the size of the pills. It seems that her pharmacy change suppliers. She did start back the metformin last week she had been taking her glimepiride as well as her Januvia. She states that she also started exercising last week but she will like something to help with weight loss as well. The urgent care physician also recommended she increase her metformin to 2000 mg extended release  She was feeling well until this morning she will cut with a sore throat she also notices a rash on her arms and her chest and her upper back which is a little itchy. No other sick contacts. She has not had any cough congestion has not had any fever. She's not had any GI symptoms.  Her medications were reviewed she did not bring her meter with her today.    Review Of Systems:  GEN- denies fatigue, fever, weight loss,weakness, recent illness HEENT- denies eye drainage, change in vision, nasal discharge, CVS- denies chest pain, palpitations RESP- denies SOB, cough, wheeze ABD- denies N/V, change in stools, abd pain GU- denies dysuria, hematuria, dribbling, incontinence MSK- denies joint pain, muscle aches, injury Neuro- denies headache, dizziness, syncope, seizure activity       Objective:    BP 110/76 (BP Location: Left Arm, Patient Position: Sitting, Cuff Size: Large)   Pulse 88   Temp 100.2 F (37.9 C)   Ht 5' 3.5" (1.613 m)   Wt 244 lb (110.7 kg)   SpO2 97%   BMI 42.54 kg/m  GEN- NAD, alert and  oriented x3 HEENT- PERRL, EOMI, non injected sclera, pink conjunctiva, MMM, oropharynx mild erythema, no exudates, nares clear, no maxillary sinus tenderess  Neck- Supple, no thyromegaly CVS- RRR, no murmur , HR 88 RESP-CTAB ABD-NABS,soft,NT,ND Skin- erythematous tiny maculopapular lesions scattered on arms, upper chest, few lesions with dry skin upper back, no lesions on palms, no lesions on LE EXT- No edema Pulses- Radial, DP- 2+        Assessment & Plan:      Problem List Items Addressed This Visit    HYPERTENSION, BENIGN SYSTEMIC (Chronic)    Controlled no change to meds       Diabetes mellitus (HCC) (Chronic)    A1c uncontrolled secondary to noncompliance of medications. She does have the obesity in the setting of her diabetes will try her on Victoza injectable. Discontinue the sulfonylurea she will restart metformin but she is to take 1000 mg extended release once a day. Start Victoza 0.6 mg increase to 1.2 mg after a week. We'll follow up in office in 4 weeks.      Relevant Medications   metFORMIN (GLUCOPHAGE-XR) 500 MG 24 hr tablet   liraglutide 18 MG/3ML SOPN    Other Visit Diagnoses    Sore throat    -  Primary   Strep was negative with regards to the rash this may be reaction to the sulfa full viral mediated. We'll give  her Depo-Medrol 40 mg injection and have her take Benadryl. It is possible that she has an early viral symptoms this is also in differential in the setting of the rash.  No red flags she can take fever reducer if she did not note she had a fever.    Relevant Orders   STREP GROUP A AG, W/REFLEX TO CULT (Completed)   Rash and nonspecific skin eruption       Relevant Medications   methylPREDNISolone acetate (DEPO-MEDROL) injection 40 mg (Completed)      Note: This dictation was prepared with Dragon dictation along with smaller phrase technology. Any transcriptional errors that result from this process are unintentional.

## 2016-04-22 LAB — CULTURE, GROUP A STREP: Organism ID, Bacteria: NORMAL

## 2016-05-17 ENCOUNTER — Encounter: Payer: Self-pay | Admitting: Surgery

## 2016-05-23 ENCOUNTER — Other Ambulatory Visit: Payer: Self-pay | Admitting: Surgery

## 2016-05-23 ENCOUNTER — Other Ambulatory Visit: Payer: Self-pay

## 2016-05-23 ENCOUNTER — Encounter: Payer: Self-pay | Admitting: Family Medicine

## 2016-05-23 ENCOUNTER — Ambulatory Visit (INDEPENDENT_AMBULATORY_CARE_PROVIDER_SITE_OTHER): Payer: BLUE CROSS/BLUE SHIELD | Admitting: Surgery

## 2016-05-23 ENCOUNTER — Ambulatory Visit (INDEPENDENT_AMBULATORY_CARE_PROVIDER_SITE_OTHER): Payer: BLUE CROSS/BLUE SHIELD | Admitting: Family Medicine

## 2016-05-23 ENCOUNTER — Encounter: Payer: Self-pay | Admitting: Surgery

## 2016-05-23 ENCOUNTER — Ambulatory Visit (HOSPITAL_COMMUNITY)
Admission: RE | Admit: 2016-05-23 | Discharge: 2016-05-23 | Disposition: A | Discharge status: Home / Self Care | Payer: BLUE CROSS/BLUE SHIELD | Source: Ambulatory Visit | Attending: Surgery | Admitting: Surgery

## 2016-05-23 VITALS — BP 133/96 | HR 82 | Temp 97.7°F | Resp 18 | Ht 63.5 in | Wt 241.1 lb

## 2016-05-23 VITALS — BP 132/82 | HR 98 | Temp 98.8°F | Resp 16 | Ht 63.6 in | Wt 242.0 lb

## 2016-05-23 DIAGNOSIS — Z6841 Body Mass Index (BMI) 40.0 and over, adult: Secondary | ICD-10-CM

## 2016-05-23 DIAGNOSIS — E1165 Type 2 diabetes mellitus with hyperglycemia: Secondary | ICD-10-CM | POA: Diagnosis not present

## 2016-05-23 DIAGNOSIS — Z86718 Personal history of other venous thrombosis and embolism: Secondary | ICD-10-CM

## 2016-05-23 DIAGNOSIS — I872 Venous insufficiency (chronic) (peripheral): Secondary | ICD-10-CM

## 2016-05-23 DIAGNOSIS — Z48812 Encounter for surgical aftercare following surgery on the circulatory system: Secondary | ICD-10-CM

## 2016-05-23 DIAGNOSIS — IMO0001 Reserved for inherently not codable concepts without codable children: Secondary | ICD-10-CM

## 2016-05-23 DIAGNOSIS — I871 Compression of vein: Secondary | ICD-10-CM

## 2016-05-23 DIAGNOSIS — E6609 Other obesity due to excess calories: Secondary | ICD-10-CM | POA: Diagnosis not present

## 2016-05-23 DIAGNOSIS — I10 Essential (primary) hypertension: Secondary | ICD-10-CM | POA: Diagnosis not present

## 2016-05-23 LAB — HM MAMMOGRAPHY

## 2016-05-23 MED ORDER — ATORVASTATIN CALCIUM 40 MG PO TABS: 40.0000 mg | ORAL_TABLET | Freq: Every day | ORAL | 3 refills | Status: DC

## 2016-05-23 MED ORDER — AMLODIPINE BESYLATE 10 MG PO TABS: 10.0000 mg | ORAL_TABLET | Freq: Every day | ORAL | 2 refills | Status: DC

## 2016-05-23 MED ORDER — LISINOPRIL 40 MG PO TABS: 40.0000 mg | ORAL_TABLET | Freq: Every day | ORAL | 2 refills | Status: DC

## 2016-05-23 MED ORDER — SITAGLIPTIN PHOSPHATE 100 MG PO TABS: 100.0000 mg | ORAL_TABLET | Freq: Every day | ORAL | 2 refills | Status: DC

## 2016-05-23 NOTE — Assessment & Plan Note (Signed)
Well controlled 

## 2016-05-23 NOTE — Assessment & Plan Note (Signed)
Continue MTF, she has not been taking Januvia will restart this as well and she will continue the Victoza at  1.2 mg. I think some of her gas pain as a mixture of both of these medications. Increase her water intake otherwise. She is not having any diarrhea or vomiting.

## 2016-05-23 NOTE — Patient Instructions (Signed)
Stay with 1.2mg  of Victoza Continue metformin 1000mg  once a day  Continue all other medications  Increase water at least 8 cups a day  F/U 2 month follow up

## 2016-05-23 NOTE — Progress Notes (Signed)
 [facility]  Patient name: [name] MRN: [id number] DOB: 02/13/1971 Sex: female   REASON FOR VISIT:    Follow up May Thurner  HISOTRY OF PRESENT ILLNESS:    The patient comes back for follow-up. In December 2008, she underwent stenting of her left external iliac vein using a 12 x 60 Cordis self-expanding stent.  This was done for swelling.  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  In July 2014, she underwent angioplasty.  In December 2015 she began having recurrence of her symptoms.  On 04/15/2014, she underwent balloon venoplasty of in-stent stenosis.  She reports no complaints of swelling today.  Occasionally she will get cramping in her leg.  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   PAST MEDICAL HISTORY:   Past Medical History:  Diagnosis Date  . Abnormal Pap smear   . Allergy    seasonal  . Atrial fibrillation (HCC)   . Diabetes mellitus 2012  . GERD (gastroesophageal reflux disease)   . Hemorrhoids   . Hyperlipidemia 2015  . Hypertension 1995  . Obesity   . Plantar fasciitis   . Sickle cell anemia (HCC)    sickle cell trait  . Sickle cell trait (H[facility]) 1995     FAMILY HISTORY:   Family History  Problem Relation Age of Onset  . Diabetes Mother   . Hypertension Mother   . Hyperlipidemia Mother   . Heart disease Sister     Wolf parkinson White  . Hearing loss Sister   . Learning disabilities Sister   . Arthritis Maternal Grandmother   . Stroke Maternal Grandmother   . Hypertension Other   . Learning disabilities Daughter   . Asthma Maternal Aunt   . Diabetes Maternal Aunt   . Hyperlipidemia Maternal Aunt   . Hypertension Maternal Aunt   . Heart attack Neg Hx   . Sudden death Neg Hx   . Colon cancer Neg Hx   . Esophageal cancer Neg Hx   . Rectal cancer Neg Hx   . Stomach cancer Neg Hx     SOCIAL  HISTORY:   Social History  Substance Use Topics  . Smoking status: Never Smoker  . Smokeless tobacco: Never Used  . Alcohol use No     ALLERGIES:   Allergies  Allergen Reactions  . Other     Pt. States every time she is "put to sleep" she because becomes nauseated and runs a fever"  . Tape Rash     CURRENT MEDICATIONS:   Current Outpatient Prescriptions  Medication Sig Dispense Refill  . acetaminophen (TYLENOL) 650 MG CR tablet Take 650 mg by mouth every 8 (eight) hours as needed for pain. Reported on 07/28/2015    . amLODipine (NORVASC) 10 MG tablet Take 1 tablet (10 mg total) by mouth daily. 90 tablet 2  . aspirin 81 MG EC tablet Take 81 mg by mouth daily.     . atorvastatin (LIPITOR) 40 MG tablet Take 1 tablet (40 mg total) by mouth daily. 90 tablet 3  . blood glucose meter kit and supplies Please dispense One Touch Ultra. Use to monitor FSBS 2x daily. Dx: E11.9. 1 each 0  . clotrimazole (LOTRIMIN) 1 % cream Apply 1 application topically 2 (two) times daily. 45 g 0  . Diclofenac Sodium 2 % SOLN Apply 1 pump twice daily. 112 g 3  . fluticasone (FLONASE) 50   MCG/ACT nasal spray Place 2 sprays into both nostrils daily. 16 g 2  . glucose blood (ONE TOUCH ULTRA TEST) test strip Use to monitor FSBS 2x daily. Dx E11.9 100 each 12  . hydrochlorothiazide (HYDRODIURIL) 25 MG tablet Take 1 tablet (25 mg total) by mouth daily. 90 tablet 3  . JANUVIA 100 MG tablet take 1 tablet by mouth once daily 30 tablet 1  . Lancet Devices (AUTO-LANCETS) MISC Test blood sugar as directed  Dx 250.00 100 each 12  . Lancets MISC Use to monitor FSBS 2x daily. Dx E11.9 100 each 12  . liraglutide 18 MG/3ML SOPN Inject 0.6mgdaily x 1 week, then increase to 1.2mg daly 3 pen 2  . lisinopril (PRINIVIL,ZESTRIL) 40 MG tablet take 1 tablet by mouth once daily 90 tablet 0  . metFORMIN (GLUCOPHAGE-XR) 500 MG 24 hr tablet Take 2 tablets (1,000 mg total) by mouth daily with breakfast. 60 tablet 2  . mupirocin ointment  (BACTROBAN) 2 % Apply 1 application topically 3 (three) times daily. 22 g 1  . naproxen (NAPROSYN) 500 MG tablet Take 1 tablet (500 mg total) by mouth 2 (two) times daily. 30 tablet 0  . nitroGLYCERIN (NITROGLYN) 2 % ointment Apply to rectal area twice a day 30 g 0  . omeprazole (PRILOSEC) 40 MG capsule Take 1 capsule (40 mg total) by mouth daily. 30 capsule 6  . glimepiride (AMARYL) 2 MG tablet take 1 tablet once daily (Patient not taking: Reported on 05/23/2016) 30 tablet 6  . meloxicam (MOBIC) 15 MG tablet Take 1 tablet (15 mg total) by mouth daily. (Patient not taking: Reported on 05/23/2016) 30 tablet 0  . Vitamin D, Ergocalciferol, (DRISDOL) 50000 units CAPS capsule Take 1 capsule (50,000 Units total) by mouth every 7 (seven) days. (Patient not taking: Reported on 04/20/2016) 12 capsule 0   No current facility-administered medications for this visit.     REVIEW OF SYSTEMS:   [X] denotes positive finding, [ ] denotes negative finding Cardiac  Comments:  Chest pain or chest pressure:    Shortness of breath upon exertion:    Short of breath when lying flat:    Irregular heart rhythm:        Vascular    Pain in calf, thigh, or hip brought on by ambulation:    Pain in feet at night that wakes you up from your sleep:     Blood clot in your veins:    Leg swelling:         Pulmonary    Oxygen at home:    Productive cough:     Wheezing:         Neurologic    Sudden weakness in arms or legs:     Sudden numbness in arms or legs:     Sudden onset of difficulty speaking or slurred speech:    Temporary loss of vision in one eye:     Problems with dizziness:         Gastrointestinal    Blood in stool:     Vomited blood:         Genitourinary    Burning when urinating:     Blood in urine:        Psychiatric    Major depression:         Hematologic    Bleeding problems:    Problems with blood clotting too easily:        Skin    Rashes or ulcers:          Constitutional      Fever or chills:      PHYSICAL EXAM:   Vitals:   05/23/16 0827  BP: (!) 133/96  Pulse: 82  Resp: 18  Temp: 97.7 F (36.5 C)  TempSrc: Oral  SpO2: 99%  Weight: 241 lb 1.6 oz (109.4 kg)  Height: 5' 3.5" (1.613 m)    GENERAL: The patient is a well-nourished female, in no acute distress. The vital signs are documented above. CARDIAC: There is a regular rate and rhythm.  VASCULAR: No significant lower extremity swelling PULMONARY: Non-labored respirations MUSCULOSKELETAL: There are no major deformities or cyanosis. NEUROLOGIC: No focal weakness or paresthesias are detected. SKIN: There are no ulcers or rashes noted. PSYCHIATRIC: The patient has a normal affect.  STUDIES:   I have ordered and reviewed her ultrasound.  Her stent is widely patent.  There is no evidence of thrombus within the venous system on the left.  MEDICAL ISSUES:   Status post left iliac vein stenting.  Patient remains asymptomatic.  Her ultrasound shows a widely patent stent.  She will continue with annual surveillance.    Wells Brabham, MD [facility] Tel [phone] Pager [phone] 

## 2016-05-23 NOTE — Progress Notes (Signed)
   Subjective:    Patient ID: [name], female    DOB: [birth year], 46 y.o.   MRN: [id number]  Patient presents for 1 month F/U (is not fasting)  Patient here for interim follow-up since last visit. At the last visit I started her on Victoza for her diabetes mellitus as well as her obesity. She is here to follow-up her glucose readings. I did also discontinue the Amaryl. Her most recent A1c was 9.7% Weight 4 weeks ago 244lbs Taking victoza initially had gas/indigestion, then nausea as she tirated dose up, but now nausea/indgestion better, still with gas pains, up to 1.2%  CBG range 130-140's fasting Metformin 1000mg  XL  Using Golean shakes   Note she was seen by her vascular surgeon to follow-up her chronic venous insufficiency and she is status post left iliac vein stenting her ultrasound today showed a patent stent she is to follow-up in one year with [facility]:  GEN- denies fatigue, fever, weight loss,weakness, recent illness HEENT- denies eye drainage, change in vision, nasal discharge, CVS- denies chest pain, palpitations RESP- denies SOB, cough, wheeze ABD- denies N/V, change in stools, abd pain GU- denies dysuria, hematuria, dribbling, incontinence MSK- denies joint pain, muscle aches, injury Neuro- denies headache, dizziness, syncope, seizure activity       Objective:    BP 132/82 (BP Location: Left Arm, Patient Position: Sitting, Cuff Size: Large)   Pulse 98   Temp 98.8 F (37.1 C) (Oral)   Resp 16   Ht 5' 3.6" (1.615 m)   Wt 242 lb (109.8 kg)   SpO2 99%   BMI 42.06 kg/m  GEN- NAD, alert and oriented x3 CVS- RRR, no murmur RESP-CTAB ABD-NABS,soft,NT,ND EXT- No edema Pulses- Radial 2+        Assessment & Plan:      Problem List Items Addressed This Visit    Obesity   Relevant Medications   sitaGLIPtin (JANUVIA) 100 MG tablet   HYPERTENSION, BENIGN SYSTEMIC (Chronic)    Well controlled      Relevant Medications   atorvastatin (LIPITOR) 40 MG tablet   lisinopril (PRINIVIL,ZESTRIL) 40 MG tablet   amLODipine (NORVASC) 10 MG tablet   Diabetes mellitus (HCC) - Primary (Chronic)    Continue MTF, she has not been taking Januvia will restart this as well and she will continue the Victoza at  1.2 mg. I think some of her gas pain as a mixture of both of these medications. Increase her water intake otherwise. She is not having any diarrhea or vomiting.      Relevant Medications   atorvastatin (LIPITOR) 40 MG tablet   lisinopril (PRINIVIL,ZESTRIL) 40 MG tablet   sitaGLIPtin (JANUVIA) 100 MG tablet      Note: This dictation was prepared with Dragon dictation along with smaller phrase technology. Any transcriptional errors that result from this process are unintentional.

## 2016-05-26 ENCOUNTER — Encounter: Payer: Self-pay | Admitting: Family Medicine

## 2016-05-26 NOTE — Addendum Note (Signed)
Addended by: Lianne Cure A on: 05/26/2016 12:08 PM   Modules accepted: Orders

## 2016-07-12 ENCOUNTER — Other Ambulatory Visit: Payer: Self-pay | Admitting: *Deleted

## 2016-07-12 ENCOUNTER — Ambulatory Visit (INDEPENDENT_AMBULATORY_CARE_PROVIDER_SITE_OTHER): Payer: BLUE CROSS/BLUE SHIELD | Admitting: Family Medicine

## 2016-07-12 ENCOUNTER — Encounter: Payer: Self-pay | Admitting: Family Medicine

## 2016-07-12 VITALS — BP 146/100 | HR 103 | Temp 98.9°F

## 2016-07-12 DIAGNOSIS — R748 Abnormal levels of other serum enzymes: Secondary | ICD-10-CM

## 2016-07-12 DIAGNOSIS — E1165 Type 2 diabetes mellitus with hyperglycemia: Secondary | ICD-10-CM | POA: Diagnosis not present

## 2016-07-12 DIAGNOSIS — R112 Nausea with vomiting, unspecified: Secondary | ICD-10-CM | POA: Diagnosis not present

## 2016-07-12 LAB — COMPREHENSIVE METABOLIC PANEL
ALBUMIN: 4 g/dL (ref 3.6–5.1)
ALT: 16 U/L (ref 6–29)
AST: 15 U/L (ref 10–35)
Alkaline Phosphatase: 78 U/L (ref 33–115)
BUN: 10 mg/dL (ref 7–25)
CALCIUM: 9.8 mg/dL (ref 8.6–10.2)
CHLORIDE: 99 mmol/L (ref 98–110)
CO2: 26 mmol/L (ref 20–31)
Creat: 1.11 mg/dL — ABNORMAL HIGH (ref 0.50–1.10)
GLUCOSE: 108 mg/dL — AB (ref 70–99)
Potassium: 4.3 mmol/L (ref 3.5–5.3)
SODIUM: 136 mmol/L (ref 135–146)
Total Bilirubin: 0.5 mg/dL (ref 0.2–1.2)
Total Protein: 7.3 g/dL (ref 6.1–8.1)

## 2016-07-12 LAB — CBC
HEMATOCRIT: 44.8 % (ref 35.0–45.0)
HEMOGLOBIN: 15 g/dL (ref 12.0–15.0)
MCH: 28.8 pg (ref 27.0–33.0)
MCHC: 33.5 g/dL (ref 32.0–36.0)
MCV: 86.2 fL (ref 80.0–100.0)
Platelets: 293 10*3/uL (ref 140–400)
RBC: 5.2 MIL/uL — ABNORMAL HIGH (ref 3.80–5.10)
RDW: 14.3 % (ref 11.0–15.0)
WBC: 8.2 10*3/uL (ref 3.8–10.8)

## 2016-07-12 LAB — GLUCOSE, FINGERSTICK (STAT): GLUCOSE, FINGERSTICK: 117 mg/dL — AB (ref 65–99)

## 2016-07-12 LAB — LIPASE: Lipase: 86 U/L — ABNORMAL HIGH (ref 7–60)

## 2016-07-12 MED ORDER — PROMETHAZINE HCL 25 MG/ML IJ SOLN
25.0000 mg | Freq: Once | INTRAMUSCULAR | Status: AC
  Administered 2016-07-12: 25 mg via INTRAMUSCULAR

## 2016-07-12 MED ORDER — PROMETHAZINE HCL 12.5 MG PO TABS: 12.5000 mg | ORAL_TABLET | Freq: Three times a day (TID) | ORAL | 0 refills | Status: DC | PRN

## 2016-07-12 MED ORDER — GLUCOSE BLOOD VI STRP: ORAL_STRIP | 12 refills | Status: DC

## 2016-07-12 NOTE — Progress Notes (Signed)
   Subjective:    Patient ID: Orlene Salmons, female    DOB: [birth year], 46 y.o.   MRN: [id number]  Patient presents for Emesis  Here with nausea vomiting that started last night around 9 PM. She went out to eat at a typical female with pork chops pinto beans felt fine while she was eating. When she got home she began having excessive vomiting multiple times in a row. She did not have anY hematemesis. She's not had any diarrhea or significant abdominal pain except for when she is retching. She is unable to tolerate any fluids has not had anything to drink since last night. She is diabetic sees her blood sugar was around 250 when she checked it last night and she did proceed with her vICTOZA injection. No fever, felt fine yesterday morning   - New patient was wretching significantly while in the bathroom waiting area   Review Of Systems:  GEN- denies fatigue, fever, weight loss,weakness, recent illness HEENT- denies eye drainage, change in vision, nasal discharge, CVS- denies chest pain, palpitations RESP- denies SOB, cough, wheeze ABD- denies N/V, change in stools, abd pain GU- denies dysuria, hematuria, dribbling, incontinence MSK- denies joint pain, muscle aches, injury Neuro- denies headache, dizziness, syncope, seizure activity       Objective:    BP (!) 146/100   Pulse (!) 103   Temp 98.9 F (37.2 C) (Oral)   SpO2 97%  GEN- NAD, alert and oriented x3 HEENT- PERRL, EOMI, non injected sclera, pink conjunctiva, MMM, oropharynx clear Neck- Supple, no thyromegaly CVS- mild tachycardia HR 103, no murmur RESP-CTAB ABD-NABS,soft,NT,ND EXT- No edema Pulses- Radial, DP- 2+    Repeat BP 138/88 CBC unremarkable, CBG 117     Assessment & Plan:      Problem List Items Addressed This Visit    Diabetes mellitus ([place]) (Chronic)   Relevant Orders   Glucose, fingerstick (stat) (Completed)    Other Visit Diagnoses    Non-intractable vomiting with nausea, unspecified vomiting type     -  Primary   Concern for food poisoning based on timing of symptoms, however CMET did show high lipase, so pancreatitis is also possible. No fever, no change in bowels for enteritis. I given her Phenergan injection in the office his help with the retching. She'll continue with this as an outpatient and push fluids. We tended to get a CT scan after I saw her increased lipase however her insurance asses under review. We have discussed with her today if her vomiting worsens or she gets abdominal pain she needs to go directly to the emergency room they can scan her there tonight.   She is to hold her diabetic medications until she is able to take proper by mouth    Relevant Medications   promethazine (PHENERGAN) injection 25 mg (Completed)   Other Relevant Orders   CBC (Completed)   Comprehensive metabolic panel (Completed)   Lipase (Completed)      Note: This dictation was prepared with Dragon dictation along with smaller phrase technology. Any transcriptional errors that result from this process are unintentional.

## 2016-07-12 NOTE — Patient Instructions (Addendum)
Take the phenergan as needed Do not take Victoza/Metformin/Lisinopril  until your symptoms resolve - wait another 24 hours F/U as needed

## 2016-07-13 ENCOUNTER — Other Ambulatory Visit: Payer: Self-pay | Admitting: *Deleted

## 2016-07-13 ENCOUNTER — Telehealth: Payer: Self-pay | Admitting: *Deleted

## 2016-07-13 MED ORDER — BLOOD GLUCOSE TEST VI STRP: ORAL_STRIP | 0 refills | Status: DC

## 2016-07-13 NOTE — Telephone Encounter (Signed)
Call placed to patient and patient made aware.   Referrals coordinator please note cancelled CT

## 2016-07-13 NOTE — Telephone Encounter (Signed)
Okay, tell him to cancel the CT scan, she is better and tolerating food

## 2016-07-13 NOTE — Telephone Encounter (Signed)
Call placed to patient to F/U.   States that she has not had any nausea this AM. States that she has had no other Sx.   Reports that she is eating very bland diet with only ginger ale and dry crackers. Advised to continue to hold MTF and Victoza until diet is back to normal.
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
## 2016-07-18 ENCOUNTER — Telehealth: Payer: Self-pay | Admitting: *Deleted

## 2016-07-18 NOTE — Telephone Encounter (Signed)
Received call from patient.   States that she is concerned about her last episode of vomiting and would like to have CT performed.   Also states that she is concerned that Victoza is causing her nausea and nasal congestion. States that she noted nausea returned once she resume injections. Also states that a week into the injections. She noted that her head was congested and had more nasal drainage.   MD please advise.

## 2016-07-18 NOTE — Telephone Encounter (Signed)
She can stop the victoza We can try Trulicity once a week, we have samples to see if she has any problems with this   With regards to CT unless she has is having recurrent Vomiting with abdominal pain, her insurance will not cover a CT scan

## 2016-07-19 NOTE — Telephone Encounter (Signed)
Call placed to patient. LMTRC.  

## 2016-07-21 MED ORDER — DULAGLUTIDE 0.75 MG/0.5ML ~~LOC~~ SOAJ: 0.7500 mg | SUBCUTANEOUS | 3 refills | Status: DC

## 2016-07-21 NOTE — Telephone Encounter (Signed)
Call placed to patient and patient made aware.    Samples of trulicty placed in fridge.

## 2016-07-22 ENCOUNTER — Ambulatory Visit: Payer: BLUE CROSS/BLUE SHIELD | Admitting: Family Medicine

## 2016-08-01 ENCOUNTER — Encounter: Payer: Self-pay | Admitting: Family Medicine

## 2016-08-01 ENCOUNTER — Ambulatory Visit (INDEPENDENT_AMBULATORY_CARE_PROVIDER_SITE_OTHER): Payer: BLUE CROSS/BLUE SHIELD | Admitting: Family Medicine

## 2016-08-01 VITALS — BP 138/78 | HR 82 | Temp 98.4°F | Resp 16 | Ht 63.5 in | Wt 242.0 lb

## 2016-08-01 DIAGNOSIS — M546 Pain in thoracic spine: Secondary | ICD-10-CM | POA: Diagnosis not present

## 2016-08-01 DIAGNOSIS — N182 Chronic kidney disease, stage 2 (mild): Secondary | ICD-10-CM | POA: Diagnosis not present

## 2016-08-01 DIAGNOSIS — E782 Mixed hyperlipidemia: Secondary | ICD-10-CM

## 2016-08-01 DIAGNOSIS — G8929 Other chronic pain: Secondary | ICD-10-CM | POA: Diagnosis not present

## 2016-08-01 DIAGNOSIS — E1165 Type 2 diabetes mellitus with hyperglycemia: Secondary | ICD-10-CM

## 2016-08-01 DIAGNOSIS — I10 Essential (primary) hypertension: Secondary | ICD-10-CM

## 2016-08-01 MED ORDER — IBUPROFEN 600 MG PO TABS: 600.0000 mg | ORAL_TABLET | Freq: Three times a day (TID) | ORAL | 0 refills | Status: DC | PRN

## 2016-08-01 MED ORDER — LIDOCAINE-PRILOCAINE 2.5-2.5 % EX CREA: 1.0000 "application " | TOPICAL_CREAM | CUTANEOUS | 2 refills | Status: DC | PRN

## 2016-08-01 MED ORDER — DULAGLUTIDE 0.75 MG/0.5ML ~~LOC~~ SOAJ: 0.7500 mg | SUBCUTANEOUS | 3 refills | Status: DC

## 2016-08-01 NOTE — Addendum Note (Signed)
Addended by: Vic Blackbird F on: 08/01/2016 08:34 PM   Modules accepted: Orders

## 2016-08-01 NOTE — Progress Notes (Signed)
   Subjective:    Patient ID: [name], female    DOB: Oct 26, 1970, 46 y.o.   MRN: [id number]  Patient presents for Follow-up (is not fasting)  Pt here to f/U DM, last A1C 9.7%, was restarted on januvia with her metformin, she was not taking consistently and victoza was increased. She now remembers having increase nausea when she would change doses of victoza and recently she came in with N/V, when she tried to restart her victoza had recurrent severe nausea. She was changed to Trulicity instead ( has had for 2 weeks), no SE except tenderness with injection and request EMLA to help with this. Fasting CBG on new meter today 140-170, after meals 120-212. Still eats a lot of fastfood.   Has some soreness in mid back from moving furniture requested refill on Motrin which has helped  Recently seen by vascular surgery, iliac stent is patent    Review Of Systems:  GEN- denies fatigue, fever, weight loss,weakness, recent illness HEENT- denies eye drainage, change in vision, nasal discharge, CVS- denies chest pain, palpitations RESP- denies SOB, cough, wheeze ABD- denies N/V, change in stools, abd pain GU- denies dysuria, hematuria, dribbling, incontinence MSK- denies joint pain, muscle aches, injury Neuro- denies headache, dizziness, syncope, seizure activity       Objective:    BP 138/78   Pulse 82   Temp 98.4 F (36.9 C) (Oral)   Resp 16   Ht 5' 3.5" (1.613 m)   Wt 242 lb (109.8 kg)   SpO2 99%   BMI 42.20 kg/m  GEN- NAD, alert and oriented x3 HEENT- PERRL, EOMI, non injected sclera, pink conjunctiva, MMM, oropharynx clear CVS- RRR, no murmur RESP-CTAB EXT- No edema MSK- Spine NT cervical throacic, lumbar, mild TTP paraspinals thoraic region , FROM upper ext bilat Pulses- Radial  2+        Assessment & Plan:      Problem List Items Addressed This Visit    HYPERTENSION, BENIGN SYSTEMIC - Primary (Chronic)    BP looks okay, continue current meds Check renal  function Direct LDL for cholesterol, on statin drug       Relevant Orders   CBC with Differential/Platelet   Comprehensive metabolic panel   Hyperlipidemia   Relevant Orders   LDL Cholesterol, Direct   Diabetes mellitus ([facility]CC) (Chronic)    Uncontrolled, goal < 7% Continue MTF, januvia, trulicity Referral to diabetic education      Relevant Medications   Dulaglutide (TRULICITY) 7.61 YW/7.3XT SOPN   Other Relevant Orders   Hemoglobin A1c   CKD (chronic kidney disease) stage 2, 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)   Chronic back pain    More MSK pain from moving, given motrin, heating pad      Relevant Medications   ibuprofen (ADVIL,MOTRIN) 600 MG tablet      Note: This dictation was prepared with Dragon dictation along with smaller phrase technology. Any transcriptional errors that result from this process are unintentional.

## 2016-08-01 NOTE — Patient Instructions (Addendum)
Referral to nutrition  We will call with lab results F/U 4 months

## 2016-08-01 NOTE — Assessment & Plan Note (Signed)
Uncontrolled, goal < 7% Continue MTF, januvia, trulicity Referral to diabetic education

## 2016-08-01 NOTE — Assessment & Plan Note (Signed)
BP looks okay, continue current meds Check renal function Direct LDL for cholesterol, on statin drug

## 2016-08-01 NOTE — Assessment & Plan Note (Signed)
More MSK pain from moving, given motrin, heating pad

## 2016-08-02 LAB — CBC WITH DIFFERENTIAL/PLATELET
BASOS PCT: 0 %
Basophils Absolute: 0 cells/uL (ref 0–200)
EOS ABS: 196 {cells}/uL (ref 15–500)
EOS PCT: 2 %
HCT: 41.7 % (ref 35.0–45.0)
Hemoglobin: 13.6 g/dL (ref 12.0–15.0)
LYMPHS PCT: 33 %
Lymphs Abs: 3234 cells/uL (ref 850–3900)
MCH: 27.4 pg (ref 27.0–33.0)
MCHC: 32.6 g/dL (ref 32.0–36.0)
MCV: 83.9 fL (ref 80.0–100.0)
MONOS PCT: 8 %
MPV: 10.6 fL (ref 7.5–12.5)
Monocytes Absolute: 784 cells/uL (ref 200–950)
NEUTROS ABS: 5586 {cells}/uL (ref 1500–7800)
Neutrophils Relative %: 57 %
PLATELETS: 320 10*3/uL (ref 140–400)
RBC: 4.97 MIL/uL (ref 3.80–5.10)
RDW: 14.5 % (ref 11.0–15.0)
WBC: 9.8 10*3/uL (ref 3.8–10.8)

## 2016-08-02 LAB — COMPREHENSIVE METABOLIC PANEL
ALT: 18 U/L (ref 6–29)
AST: 14 U/L (ref 10–35)
Albumin: 4 g/dL (ref 3.6–5.1)
Alkaline Phosphatase: 71 U/L (ref 33–115)
BUN: 9 mg/dL (ref 7–25)
CHLORIDE: 104 mmol/L (ref 98–110)
CO2: 26 mmol/L (ref 20–31)
CREATININE: 0.96 mg/dL (ref 0.50–1.10)
Calcium: 9.4 mg/dL (ref 8.6–10.2)
GLUCOSE: 101 mg/dL — AB (ref 70–99)
POTASSIUM: 4.1 mmol/L (ref 3.5–5.3)
SODIUM: 138 mmol/L (ref 135–146)
TOTAL PROTEIN: 7.1 g/dL (ref 6.1–8.1)
Total Bilirubin: 0.3 mg/dL (ref 0.2–1.2)

## 2016-08-02 LAB — HEMOGLOBIN A1C
Hgb A1c MFr Bld: 6.8 % — ABNORMAL HIGH (ref <5.7)
Mean Plasma Glucose: 148 mg/dL

## 2016-08-02 LAB — LDL CHOLESTEROL, DIRECT: Direct LDL: 110 mg/dL (ref <130)

## 2016-08-08 ENCOUNTER — Other Ambulatory Visit: Payer: Self-pay | Admitting: *Deleted

## 2016-08-08 MED ORDER — HYDROCHLOROTHIAZIDE 25 MG PO TABS: 25.0000 mg | ORAL_TABLET | Freq: Every day | ORAL | 3 refills | Status: DC

## 2016-08-30 ENCOUNTER — Encounter: Payer: BLUE CROSS/BLUE SHIELD | Attending: Family Medicine | Admitting: *Deleted

## 2016-08-30 DIAGNOSIS — Z713 Dietary counseling and surveillance: Secondary | ICD-10-CM | POA: Diagnosis not present

## 2016-08-30 DIAGNOSIS — E1165 Type 2 diabetes mellitus with hyperglycemia: Secondary | ICD-10-CM | POA: Diagnosis not present

## 2016-08-30 NOTE — Patient Instructions (Signed)
Plan:  Aim for 2-3 Carb Choices per meal (30-45 grams)  Aim for 0-1 Carbs per snack if hungry  Include protein in moderation with your meals and snacks Consider reading food labels for Total Carbohydrate of foods Consider  increasing your activity level by walking, going to gym and Arm Chair Exercise Videos daily as tolerated Continue checking BG at alternate times per day  Continue taking medication as directed by MD

## 2016-09-03 NOTE — Progress Notes (Signed)
Diabetes Self-Management Education  Visit Type:    Appt. Start Time: 1530 Appt. End Time: 1700  09/03/2016  Ms. [name], identified by name and date of birth, is a 46 y.o. female with a diagnosis of Diabetes: Type 2. 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 Very little activity at this time  ASSESSMENT  Height 5\' 3"  (1.6 m), weight 243 lb 12.8 oz (110.6 kg). Body mass index is 43.19 kg/m.      Diabetes Self-Management Education - 08/30/16 1544      Initial Visit   Diabetes Type Type 2   Are you currently following a meal plan? No   Are you taking your medications as prescribed? Yes   Date Diagnosed 05/2010     Health Coping   How would you rate your overall health? Fair     Psychosocial Assessment   How often do you need to have someone help you when you read instructions, pamphlets, or other written materials from your doctor or pharmacy? 1 - Never   What is the last grade level you completed in school? masters     Complications   Last HgB A1C per patient/outside source 6.8 %   How often do you check your blood sugar? 1-2 times/day   Have you had a dilated eye exam in the past 12 months? Yes   Have you had a dental exam in the past 12 months? Yes   Are you checking your feet? Yes   How many days per week are you checking your feet? 4     Dietary Intake   Breakfast oatmeal or grits OR food at [employer] OR flavored muffin   Snack (morning) no   Lunch left overs OR skips OR waits until 2 PM and goes to get a burger or [employer]   Snack (afternoon) no   Dinner take out OR meat, starch and vegetables OR meal salad occasionally   Snack (evening) not unless fresh fruit or crackers with PNB   Beverage(s) apple juice, water, regular soda     Exercise   Exercise Type Light (walking / raking leaves)   How many days per week to you exercise? 2   How many minutes per day do you exercise? 30   Total minutes  per week of exercise 60     Patient Education   Previous Diabetes Education Yes (please comment)   Disease state  Definition of diabetes, type 1 and 2, and the diagnosis of diabetes   Nutrition management  Role of diet in the treatment of diabetes and the relationship between the three main macronutrients and blood glucose level;Food label reading, portion sizes and measuring food.;Carbohydrate counting   Physical activity and exercise  Role of exercise on diabetes management, blood pressure control and cardiac health.   Medications Reviewed patients medication for diabetes, action, purpose, timing of dose and side effects.   Monitoring Identified appropriate SMBG and/or A1C goals.   Chronic complications Relationship between chronic complications and blood glucose control   Psychosocial adjustment Role of stress on diabetes     Individualized Goals (developed by patient)   Nutrition Follow meal plan discussed   Physical Activity Exercise 3-5 times per week   Medications take my medication as prescribed   Monitoring  test blood glucose pre and post meals as discussed     Outcomes   Expected Outcomes Demonstrated interest in learning. Expect positive outcomes  Future DMSE PRN   Program Status Completed      Individualized Plan for Diabetes Self-Management Training:   Learning Objective:  Patient will have a greater understanding of diabetes self-management. Patient education plan is to attend individual and/or group sessions per assessed needs and concerns.   Plan:   Patient Instructions  Plan:  Aim for 2-3 Carb Choices per meal (30-45 grams)  Aim for 0-1 Carbs per snack if hungry  Include protein in moderation with your meals and snacks Consider reading food labels for Total Carbohydrate of foods Consider  increasing your activity level by walking, going to gym and Arm Chair Exercise Videos daily as tolerated Continue checking BG at alternate times per day  Continue taking  medication as directed by MD  Expected Outcomes:  Demonstrated interest in learning. Expect positive outcomes  Education material provided: Living Well with Diabetes, Meal plan card and Carbohydrate counting sheet  If problems or questions, patient to contact team via:  Phone  Future DSME appointment: PRN

## 2016-09-21 ENCOUNTER — Other Ambulatory Visit: Payer: Self-pay | Admitting: Family Medicine

## 2016-10-24 ENCOUNTER — Ambulatory Visit: Payer: Self-pay | Admitting: Family Medicine

## 2016-11-10 ENCOUNTER — Encounter: Payer: Self-pay | Admitting: Family Medicine

## 2016-11-10 ENCOUNTER — Ambulatory Visit (INDEPENDENT_AMBULATORY_CARE_PROVIDER_SITE_OTHER): Payer: BLUE CROSS/BLUE SHIELD | Admitting: Family Medicine

## 2016-11-10 ENCOUNTER — Ambulatory Visit: Payer: Self-pay

## 2016-11-10 VITALS — BP 150/98 | Wt 241.0 lb

## 2016-11-10 DIAGNOSIS — M25571 Pain in right ankle and joints of right foot: Secondary | ICD-10-CM

## 2016-11-10 DIAGNOSIS — M67471 Ganglion, right ankle and foot: Secondary | ICD-10-CM | POA: Diagnosis not present

## 2016-11-10 DIAGNOSIS — M7660 Achilles tendinitis, unspecified leg: Secondary | ICD-10-CM

## 2016-11-10 DIAGNOSIS — M6788 Other specified disorders of synovium and tendon, other site: Secondary | ICD-10-CM

## 2016-11-10 MED ORDER — VITAMIN D (ERGOCALCIFEROL) 1.25 MG (50000 UNIT) PO CAPS: 50000.0000 [IU] | ORAL_CAPSULE | ORAL | 0 refills | Status: DC

## 2016-11-10 NOTE — Progress Notes (Signed)
[name] [address] [place], [place] [address] Phone: [phone] Subjective:    I'm seeing this patient by the request  of:    CC: Right foot pain and bump   [id number]  [name] is a 46 y.o. female coming in with complaint of right foot pain and bump. Patient by the specific type of shoe. Patient states that it seems to rub on her first toe on the right foot. Has had his leg. States that the pain is fairly severe. Unable to wear closed toe shoes. Patient states that it was quite severe limitation pressure on it. Denies numbness.     Past Medical History:  Diagnosis Date  . Abnormal Pap smear   . Allergy    seasonal  . Atrial fibrillation ([place])   . Diabetes mellitus 2012  . GERD (gastroesophageal reflux disease)   . Hemorrhoids   . Hyperlipidemia 2015  . Hypertension 1995  . Obesity   . Plantar fasciitis   . Sickle cell anemia (HCC)    sickle cell trait  . Sickle cell trait ([place]) 1995   Past Surgical History:  Procedure Laterality Date  . ABDOMINAL HYSTERECTOMY  2005   for Fibroids  . CESAREAN SECTION    . ILIAC VEIN ANGIOPLASTY / STENTING    . SKIN TAG REMOVAL  Sept. 25, 2015   Right uppper arm  . TUBAL LIGATION    . VENOGRAM N/A 11/07/2012   Procedure: VENOGRAM;  Surgeon: Serafina Mitchell, MD;  [facility] CATH LAB;  Service: Cardiovascular;  Laterality: N/A;  . VENOGRAM N/A 04/15/2014   Procedure: VENOGRAM;  Surgeon: Serafina Mitchell, MD;  Location: [facility] CATH LAB;  Service: Cardiovascular;  Laterality: N/A;   Social History   Social History  . Marital status: Single    Spouse name: N/A  . Number of children: N/A  . Years of education: N/A   Social History Main Topics  . Smoking status: Never Smoker  . Smokeless tobacco: Never Used  . Alcohol use No  . Drug use: No  . Sexual activity: Not Currently   Other Topics Concern  . Not on file   Social History Narrative  . No narrative on file   Allergies  Allergen  Reactions  . Other     Pt. States every time she is "put to sleep" she because becomes nauseated and runs a fever"  . Victoza [Liraglutide]     Nausea/vomiting  . Tape Rash   Family History  Problem Relation Age of Onset  . Diabetes Mother   . Hypertension Mother   . Hyperlipidemia Mother   . Heart disease Sister        [employer]  . Hearing loss Sister   . Learning disabilities Sister   . Arthritis Maternal Grandmother   . Stroke Maternal Grandmother   . Hypertension Other   . Learning disabilities Daughter   . Asthma Maternal Aunt   . Diabetes Maternal Aunt   . Hyperlipidemia Maternal Aunt   . Hypertension Maternal Aunt   . Heart attack Neg Hx   . Sudden death Neg Hx   . Colon cancer Neg Hx   . Esophageal cancer Neg Hx   . Rectal cancer Neg Hx   . Stomach cancer Neg Hx     Past medical history, social, surgical and family history all reviewed in electronic medical record.  No pertanent information unless stated regarding to the chief complaint.   Review  of Systems:Review of systems updated and as accurate as of 11/10/16  No headache, visual changes, nausea, vomiting, diarrhea, constipation, dizziness, abdominal pain, skin rash, fevers, chills, night sweats, weight loss, swollen lymph nodes, body aches, joint swelling, muscle aches, chest pain, shortness of breath, mood changes.   Objective  Blood pressure (!) 150/98, weight 241 lb (109.3 kg). Systems examined below as of 11/10/16   General: No apparent distress alert and oriented x3 mood and affect normal, dressed appropriately.  HEENT: Pupils equal, extraocular movements intact  Respiratory: Patient's speak in full sentences and does not appear short of breath  Cardiovascular: No lower extremity edema, non tender, no erythema  Skin: Warm dry intact with no signs of infection or rash on extremities or on axial skeleton.  Abdomen: Soft nontender  Neuro: Cranial nerves II through XII are intact,  neurovascularly intact in all extremities with 2+ DTRs and 2+ pulses.  Lymph: No lymphadenopathy of posterior or anterior cervical chain or axillae bilaterally.  Gait normal with good balance and coordination.  MSK:  Non tender with full range of motion and good stability and symmetric strength and tone of shoulders, elbows, wrist, hip, knee bilaterally. Foot exam shows the patient does have significant pes planus bilaterally. Patient also has significant tightness of the Achilles bilaterally right greater than left. First metatarsal just proximal to the MP joint shows a large cystic formation.  Limited musculoskeletal skeletal ultrasound was performed and interpreted by Lyndal Pulley  Limited muscular skeletal ultrasound shows the patient does have a cystic mass very superficial just proximal to the MP joint of the right foot. Compressible. Approximate 1 cm. No vascularity. Impression: Simple cyst over the first metatarsal.  Procedure: Real-time Ultrasound Guided Injection of right first metatarsal cyst Device: GE Logiq Q7 Ultrasound guided injection is preferred based studies that show increased duration, increased effect, greater accuracy, decreased procedural pain, increased response rate, and decreased cost with ultrasound guided versus blind injection.  Verbal informed consent obtained.  Time-out conducted.  Noted no overlying erythema, induration, or other signs of local infection.  Skin prepped in a sterile fashion.  Local anesthesia: Topical Ethyl chloride.  With sterile technique and under real time ultrasound guidance:  With a 25-gauge half-inch needle patient was injected with a total of 0.5 mL of 0.5% Marcaine and 0.5 mL of Kenalog 40 mg/dL. Completed without difficulty  Pain immediately resolved suggesting accurate placement of the medication.  Advised to call if fevers/chills, erythema, induration, drainage, or persistent bleeding.  Images permanently stored and available for  review in the ultrasound unit.  Impression: Technically successful ultrasound guided injection.    Impression and Recommendations:     This case required medical decision making of moderate complexity.      Note: This dictation was prepared with Dragon dictation along with smaller phrase technology. Any transcriptional errors that result from this process are unintentional.

## 2016-11-10 NOTE — Assessment & Plan Note (Signed)
Recurring. Discussed with patient at great length about icing regimen and home exercises. Patient was given a brace today. Topical anti-inflammatories given as well.

## 2016-11-10 NOTE — Assessment & Plan Note (Signed)
Patient given injection today and tolerated the procedure well. We discussed icing regimen and home exercises. Discussed which activities to do a which ones to avoid. Patient will increase activity as tolerated. Follow-up with me again in 4 weeks.

## 2016-11-10 NOTE — Patient Instructions (Signed)
great to see you  Ice 20 minutes 2 times daily. Usually after activity and before bed. Wear brace daily  pennsaid pinkie amount topically 2 times daily as needed.  Exercises 3 times a week.  Drained cyst on your toe today and should help  See me again in 4-6 weeks

## 2016-12-15 ENCOUNTER — Ambulatory Visit (INDEPENDENT_AMBULATORY_CARE_PROVIDER_SITE_OTHER): Payer: BLUE CROSS/BLUE SHIELD | Admitting: Family Medicine

## 2016-12-15 ENCOUNTER — Ambulatory Visit: Payer: Self-pay

## 2016-12-15 ENCOUNTER — Encounter: Payer: Self-pay | Admitting: Family Medicine

## 2016-12-15 VITALS — BP 140/100 | HR 60 | Ht 63.0 in | Wt 244.0 lb

## 2016-12-15 DIAGNOSIS — M79671 Pain in right foot: Secondary | ICD-10-CM

## 2016-12-15 DIAGNOSIS — M7660 Achilles tendinitis, unspecified leg: Secondary | ICD-10-CM

## 2016-12-15 DIAGNOSIS — M6788 Other specified disorders of synovium and tendon, other site: Secondary | ICD-10-CM

## 2016-12-15 MED ORDER — NITROGLYCERIN 0.2 MG/HR TD PT24
MEDICATED_PATCH | TRANSDERMAL | 1 refills | Status: DC
Start: 2016-12-15 — End: 2018-05-28

## 2016-12-15 NOTE — Progress Notes (Signed)
[name] [place], [place] [address] Phone: [phone] Subjective:    I'm seeing this patient by the request  of:    CC: Right foot pain and bump f/u  [id number]  [name] is a 46 y.o. female coming in with complaint of right foot pain and bump.Patient did have a cyst on the first metatarsal. That still significant a better after injection. Continues to have more of an Achilles pain. Describes the pain as a dull, throbbing aching sensation. Were the brace occasionally with minimal improvement. Patient is not wearing any closed toe shoes and was suggested. Patient has been doing icing regimen intermittently. Patient states minimal benefit    Past Medical History:  Diagnosis Date  . Abnormal Pap smear   . Allergy    seasonal  . Atrial fibrillation ([place])   . Diabetes mellitus 2012  . GERD (gastroesophageal reflux disease)   . Hemorrhoids   . Hyperlipidemia 2015  . Hypertension 1995  . Obesity   . Plantar fasciitis   . Sickle cell anemia (HCC)    sickle cell trait  . Sickle cell trait ([place]) 1995   Past Surgical History:  Procedure Laterality Date  . ABDOMINAL HYSTERECTOMY  2005   for Fibroids  . CESAREAN SECTION    . ILIAC VEIN ANGIOPLASTY / STENTING    . SKIN TAG REMOVAL  Sept. 25, 2015   Right uppper arm  . TUBAL LIGATION    . VENOGRAM N/A 11/07/2012   Procedure: VENOGRAM;  Surgeon: Serafina Mitchell, MD;  Location: [facility] C[facility] LAB;  Service: Cardiovascular;  Laterality: N/A;  . VENOGRAM N/A 04/15/2014   Procedure: VENOGRAM;  Surgeon: Serafina Mitchell, MD;  Location: [facility] CATH LAB;  Service: Cardiovascular;  Laterality: N/A;   Social History   Social History  . Marital status: Single    Spouse name: N/A  . Number of children: N/A  . Years of education: N/A   Social History Main Topics  . Smoking status: Never Smoker  . Smokeless tobacco: Never Used  . Alcohol use No  . Drug use: No  . Sexual activity: Not Currently    Other Topics Concern  . None   Social History Narrative  . None   Allergies  Allergen Reactions  . Other     Pt. States every time she is "put to sleep" she because becomes nauseated and runs a fever"  . Victoza [Liraglutide]     Nausea/vomiting  . Tape Rash   Family History  Problem Relation Age of Onset  . Diabetes Mother   . Hypertension Mother   . Hyperlipidemia Mother   . Heart disease Sister        [employer]  . Hearing loss Sister   . Learning disabilities Sister   . Arthritis Maternal Grandmother   . Stroke Maternal Grandmother   . Hypertension Other   . Learning disabilities Daughter   . Asthma Maternal Aunt   . Diabetes Maternal Aunt   . Hyperlipidemia Maternal Aunt   . Hypertension Maternal Aunt   . Heart attack Neg Hx   . Sudden death Neg Hx   . Colon cancer Neg Hx   . Esophageal cancer Neg Hx   . Rectal cancer Neg Hx   . Stomach cancer Neg Hx     Past medical history, social, surgical and family history all reviewed in electronic medical record.  No pertanent information unless stated regarding to the chief complaint.  Review of Systems: No headache, visual changes, nausea, vomiting, diarrhea, constipation, dizziness, abdominal pain, skin rash, fevers, chills, night sweats, weight loss, swollen lymph nodes, body aches, joint swelling,  chest pain, shortness of breath, mood changes.  Positive muscle aches  Objective  Blood pressure (!) 140/100, pulse 60, height 5\' 3"  (1.6 m), weight 244 lb (110.7 kg). Systems examined below as of 12/15/16   Systems examined below as of 12/15/16 General: NAD A&O x3 mood, affect normal  HEENT: Pupils equal, extraocular movements intact no nystagmus Respiratory: not short of breath at rest or with speaking Cardiovascular: No lower extremity edema, non tender Skin: Warm dry intact with no signs of infection or rash on extremities or on axial skeleton. Abdomen: Soft nontender, no masses Neuro: Cranial  nerves  intact, neurovascularly intact in all extremities with 2+ DTRs and 2+ pulses. Lymph: No lymphadenopathy appreciated today  Gait Mild antalgic gait MSK: Non tender with full range of motion and good stability and symmetric strength and tone of shoulders, elbows, wrist,  knee hips and ankles bilaterally.    Cyst on the first metatarsal is improved. Still has significant breakdown of the longitudinal and transverse arch bilaterally. Patient still has tenderness over the Achilles tendon. Tightness of the posterior Court noted.  Limited musculoskeletal skeletal ultrasound was performed and interpreted by Lyndal Pulley  Limited muscular skeletal ultrasound s patient does have what appears to be more calcific changes of the Achilles tendon distally. No tearing appreciated. Decreased hypoechoic changes Impression: Calcific Achilles tendinitis    Impression and Recommendations:     This case required medical decision making of moderate complexity.      Note: This dictation was prepared with Dragon dictation along with smaller phrase technology. Any transcriptional errors that result from this process are unintentional.

## 2016-12-15 NOTE — Assessment & Plan Note (Signed)
No improvement in with ultrasound appears to be worsening. Discussed with patient about continuing bracing, nitroglycerin patches given and warned of potential side effects. Patient is willing to try. We discussed icing regimen, patient will come back and see me again in 4 weeks. Patient declined any type of physical therapy.

## 2016-12-15 NOTE — Progress Notes (Signed)
[name] [place], [place] [address] Phone: [phone] Subjective:    I'm seeing this patient by the request  of:    CC:   [id number]  [name] is a 46 y.o. female coming in with complaint of right ankle pain. She has been doing the exercises and her pain is still there. She is not better but not worse.   Onset-  Location Duration-  Character- Aggravating factors- Reliving factors-  Therapies tried-  Severity-     Past Medical History:  Diagnosis Date  . Abnormal Pap smear   . Allergy    seasonal  . Atrial fibrillation ([place])   . Diabetes mellitus 2012  . GERD (gastroesophageal reflux disease)   . Hemorrhoids   . Hyperlipidemia 2015  . Hypertension 1995  . Obesity   . Plantar fasciitis   . Sickle cell anemia (HCC)    sickle cell trait  . Sickle cell trait ([place]) 1995   Past Surgical History:  Procedure Laterality Date  . ABDOMINAL HYSTERECTOMY  2005   for Fibroids  . CESAREAN SECTION    . ILIAC VEIN ANGIOPLASTY / STENTING    . SKIN TAG REMOVAL  Sept. 25, 2015   Right uppper arm  . TUBAL LIGATION    . VENOGRAM N/A 11/07/2012   Procedure: VENOGRAM;  Surgeon: Serafina Mitchell, MD;  Location[facility] CATH LAB;  Service: Cardiovascular;  Laterality: N/A;  . VENOGRAM N/A 04/15/2014   Procedure: VENOGRAM;  Surgeon: Serafina Mitchell, MD;  Location: [facility] CATH LAB;  Service: Cardiovascular;  Laterality: N/A;   Social History   Social History  . Marital status: Single    Spouse name: N/A  . Number of children: N/A  . Years of education: N/A   Social History Main Topics  . Smoking status: Never Smoker  . Smokeless tobacco: Never Used  . Alcohol use No  . Drug use: No  . Sexual activity: Not Currently   Other Topics Concern  . Not on file   Social History Narrative  . No narrative on file   Allergies  Allergen Reactions  . Other     Pt. States every time she is "put to sleep" she because becomes nauseated and  runs a fever"  . Victoza [Liraglutide]     Nausea/vomiting  . Tape Rash   Family History  Problem Relation Age of Onset  . Diabetes Mother   . Hypertension Mother   . Hyperlipidemia Mother   . Heart disease Sister        [employer]  . Hearing loss Sister   . Learning disabilities Sister   . Arthritis Maternal Grandmother   . Stroke Maternal Grandmother   . Hypertension Other   . Learning disabilities Daughter   . Asthma Maternal Aunt   . Diabetes Maternal Aunt   . Hyperlipidemia Maternal Aunt   . Hypertension Maternal Aunt   . Heart attack Neg Hx   . Sudden death Neg Hx   . Colon cancer Neg Hx   . Esophageal cancer Neg Hx   . Rectal cancer Neg Hx   . Stomach cancer Neg Hx      Past medical history, social, surgical and family history all reviewed in electronic medical record.  No pertanent information unless stated regarding to the chief complaint.   Review of Systems:Review of systems updated and as accurate as of 12/15/16  No headache, visual changes, nausea, vomiting, diarrhea, constipation, dizziness, abdominal  pain, skin rash, fevers, chills, night sweats, weight loss, swollen lymph nodes, body aches, joint swelling, muscle aches, chest pain, shortness of breath, mood changes.   Objective  Blood pressure 120/80. Systems examined below as of 12/15/16   General: No apparent distress alert and oriented x3 mood and affect normal, dressed appropriately.  HEENT: Pupils equal, extraocular movements intact  Respiratory: Patient's speak in full sentences and does not appear short of breath  Cardiovascular: No lower extremity edema, non tender, no erythema  Skin: Warm dry intact with no signs of infection or rash on extremities or on axial skeleton.  Abdomen: Soft nontender  Neuro: Cranial nerves II through XII are intact, neurovascularly intact in all extremities with 2+ DTRs and 2+ pulses.  Lymph: No lymphadenopathy of posterior or anterior cervical chain or  axillae bilaterally.  Gait normal with good balance and coordination.  MSK:  Non tender with full range of motion and good stability and symmetric strength and tone of shoulders, elbows, wrist, hip, knee and ankles bilaterally.     Impression and Recommendations:     This case required medical decision making of moderate complexity.      Note: This dictation was prepared with Dragon dictation along with smaller phrase technology. Any transcriptional errors that result from this process are unintentional.

## 2016-12-15 NOTE — Patient Instructions (Signed)
Good to see yo  Continue the exercises Try to wear better shoes Continue the vitamin D Nitroglycerin Protocol   Apply 1/4 nitroglycerin patch to affected area daily.  Change position of patch within the affected area every 24 hours.  You may experience a headache during the first 1-2 weeks of using the patch, these should subside.  If you experience headaches after beginning nitroglycerin patch treatment, you may take your preferred over the counter pain reliever.  Another side effect of the nitroglycerin patch is skin irritation or rash related to patch adhesive.  Please notify our office if you develop more severe headaches or rash, and stop the patch.  Tendon healing with nitroglycerin patch may require 12 to 24 weeks depending on the extent of injury.  Men should not use if taking Viagra, Cialis, or Levitra.   Do not use if you have migraines or rosacea.   See me again in 4-6 weeks

## 2017-01-26 ENCOUNTER — Ambulatory Visit: Payer: BLUE CROSS/BLUE SHIELD | Admitting: Family Medicine

## 2017-02-10 ENCOUNTER — Encounter: Payer: Self-pay | Admitting: Family Medicine

## 2017-02-10 ENCOUNTER — Ambulatory Visit (INDEPENDENT_AMBULATORY_CARE_PROVIDER_SITE_OTHER): Payer: BLUE CROSS/BLUE SHIELD | Admitting: Family Medicine

## 2017-02-10 VITALS — BP 148/102 | HR 109 | Temp 98.2°F | Resp 16 | Wt 247.0 lb

## 2017-02-10 DIAGNOSIS — E1165 Type 2 diabetes mellitus with hyperglycemia: Secondary | ICD-10-CM

## 2017-02-10 DIAGNOSIS — I739 Peripheral vascular disease, unspecified: Secondary | ICD-10-CM

## 2017-02-10 DIAGNOSIS — Z6841 Body Mass Index (BMI) 40.0 and over, adult: Secondary | ICD-10-CM

## 2017-02-10 DIAGNOSIS — E782 Mixed hyperlipidemia: Secondary | ICD-10-CM

## 2017-02-10 DIAGNOSIS — I10 Essential (primary) hypertension: Secondary | ICD-10-CM

## 2017-02-10 DIAGNOSIS — M25511 Pain in right shoulder: Secondary | ICD-10-CM

## 2017-02-10 DIAGNOSIS — N182 Chronic kidney disease, stage 2 (mild): Secondary | ICD-10-CM

## 2017-02-10 MED ORDER — DICLOFENAC SODIUM 75 MG PO TBEC: 75.0000 mg | DELAYED_RELEASE_TABLET | Freq: Two times a day (BID) | ORAL | 0 refills | Status: DC

## 2017-02-10 MED ORDER — METOPROLOL TARTRATE 25 MG PO TABS: 12.5000 mg | ORAL_TABLET | Freq: Two times a day (BID) | ORAL | 3 refills | Status: DC

## 2017-02-10 NOTE — Patient Instructions (Signed)
Take diclofenac twice a day with food  Take the metoprolol 1/2 tablet twice a day for blood pressure  [place]- Bariatric Surgery- Seminar  We will call with lab results  F/U 3 weeks for recheck

## 2017-02-10 NOTE — Progress Notes (Signed)
Subjective:    Patient ID: [name], female    DOB: 12-11-1970, 46 y.o.   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, while out of town 1 week during the cold and rain had aching severely into joint, she was not able to raise her arm, started using OTC pain patch and heating pad this past Monday, has had mild improvement in ROM/PAIN    Review Of Systems:  GEN- denies fatigue, fever, weight loss,weakness, recent illness HEENT- denies eye drainage, change in vision, nasal discharge, CVS- denies chest pain, palpitations RESP- denies SOB, cough, wheeze ABD- denies N/V, change in stools, abd pain GU- denies dysuria, hematuria, dribbling, incontinence MSK- + joint pain, muscle aches, injury Neuro- denies headache, dizziness, syncope, seizure activity       Objective:    BP (!) 148/102   Pulse (!) 109   Temp 98.2 F (36.8 C) (Oral)   Resp 16   Wt 247 lb (112 kg)   SpO2 97%   BMI 43.75 kg/m  GEN- NAD, alert and oriented x3 HEENT- PERRL, EOMI, non injected sclera, pink conjunctiva, MMM, oropharynx clear Neck- Supple, no thyromegaly CVS- RRR, no murmur RESP-CTAB MSK- fair ROM right Upper ext compared to left, equivical Empty can , biceps in tact, Good ROM neck, good ROM Left UE, mild TTP over delotid EXT- No edema Pulses- Radial 2+        Assessment & Plan:      Problem List Items Addressed This Visit      Unprioritized   Peripheral vascular disease, unspecified (HCC)   Relevant Medications   metoprolol tartrate (LOPRESSOR) 25 MG tablet   CKD (chronic kidney disease) stage 2, 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)   Obesity    Discussed bariatric surgery, she has tried other options Multiple comorbidites She will look into seminar       HYPERTENSION, BENIGN SYSTEMIC - Primary (Chronic)    Uncontrolled and mild tachycardia Add metoprolol 12.5mg  BID  F/u in a few weeks       Relevant Medications   metoprolol tartrate (LOPRESSOR) 25 MG tablet   Other Relevant Orders   CBC with Differential/Platelet (Completed)   Comprehensive metabolic panel (Completed)   Hyperlipidemia    Check direct LDL goal is < 100      Relevant Medications   metoprolol tartrate (LOPRESSOR) 25 MG tablet   Other Relevant Orders   LDL cholesterol, direct (Completed)   Diabetes mellitus (HCC) (Chronic)    Goal is A1C < 7% Continue current medications Check renal function Continue discuss dietary changes       Relevant Orders   Hemoglobin A1c (Completed)    Other Visit Diagnoses    Acute pain of right shoulder       Treat for strain of shoulder- possible rotator cuff injury, trial of diclofenac, ICE, ROm exercises      Note: This dictation was prepared with Dragon dictation along with smaller phrase technology. Any  transcriptional errors that result from this process are unintentional.

## 2017-02-12 LAB — CBC WITH DIFFERENTIAL/PLATELET
BASOS PCT: 0.4 %
Basophils Absolute: 33 cells/uL (ref 0–200)
EOS ABS: 249 {cells}/uL (ref 15–500)
Eosinophils Relative: 3 %
HEMATOCRIT: 42.8 % (ref 35.0–45.0)
Hemoglobin: 14.2 g/dL (ref 11.7–15.5)
LYMPHS ABS: 2548 {cells}/uL (ref 850–3900)
MCH: 27.2 pg (ref 27.0–33.0)
MCHC: 33.2 g/dL (ref 32.0–36.0)
MCV: 82 fL (ref 80.0–100.0)
MPV: 11.2 fL (ref 7.5–12.5)
Monocytes Relative: 7.8 %
NEUTROS PCT: 58.1 %
Neutro Abs: 4822 cells/uL (ref 1500–7800)
Platelets: 300 10*3/uL (ref 140–400)
RBC: 5.22 10*6/uL — ABNORMAL HIGH (ref 3.80–5.10)
RDW: 13.1 % (ref 11.0–15.0)
Total Lymphocyte: 30.7 %
WBC: 8.3 10*3/uL (ref 3.8–10.8)
WBCMIX: 647 {cells}/uL (ref 200–950)

## 2017-02-12 LAB — COMPREHENSIVE METABOLIC PANEL
AG Ratio: 1.1 (calc) (ref 1.0–2.5)
ALT: 16 U/L (ref 6–29)
AST: 15 U/L (ref 10–35)
Albumin: 3.9 g/dL (ref 3.6–5.1)
Alkaline phosphatase (APISO): 71 U/L (ref 33–115)
BUN / CREAT RATIO: 8 (calc) (ref 6–22)
BUN: 9 mg/dL (ref 7–25)
CO2: 26 mmol/L (ref 20–32)
CREATININE: 1.11 mg/dL — AB (ref 0.50–1.10)
Calcium: 9.4 mg/dL (ref 8.6–10.2)
Chloride: 105 mmol/L (ref 98–110)
GLUCOSE: 127 mg/dL — AB (ref 65–99)
Globulin: 3.4 g/dL (calc) (ref 1.9–3.7)
Potassium: 3.9 mmol/L (ref 3.5–5.3)
SODIUM: 139 mmol/L (ref 135–146)
TOTAL PROTEIN: 7.3 g/dL (ref 6.1–8.1)
Total Bilirubin: 0.2 mg/dL (ref 0.2–1.2)

## 2017-02-12 LAB — HEMOGLOBIN A1C
HEMOGLOBIN A1C: 6.4 %{Hb} — AB (ref <5.7)
Mean Plasma Glucose: 137 (calc)
eAG (mmol/L): 7.6 (calc)

## 2017-02-12 LAB — LDL CHOLESTEROL, DIRECT: LDL DIRECT: 121 mg/dL — AB (ref <100)

## 2017-02-12 MED ORDER — SITAGLIPTIN PHOSPHATE 100 MG PO TABS: 100.0000 mg | ORAL_TABLET | Freq: Every day | ORAL | 2 refills | Status: DC

## 2017-02-12 MED ORDER — HYDROCHLOROTHIAZIDE 25 MG PO TABS: 25.0000 mg | ORAL_TABLET | Freq: Every day | ORAL | 3 refills | Status: DC

## 2017-02-12 MED ORDER — METFORMIN HCL 500 MG PO TABS: 500.0000 mg | ORAL_TABLET | Freq: Two times a day (BID) | ORAL | 2 refills | Status: DC

## 2017-02-12 MED ORDER — FLUTICASONE PROPIONATE 50 MCG/ACT NA SUSP: 2.0000 | Freq: Every day | NASAL | 2 refills | Status: DC

## 2017-02-12 MED ORDER — DULAGLUTIDE 0.75 MG/0.5ML ~~LOC~~ SOAJ: 0.7500 mg | SUBCUTANEOUS | 3 refills | Status: DC

## 2017-02-12 MED ORDER — OMEPRAZOLE 40 MG PO CPDR: 40.0000 mg | DELAYED_RELEASE_CAPSULE | Freq: Every day | ORAL | 2 refills | Status: DC

## 2017-02-12 MED ORDER — ATORVASTATIN CALCIUM 40 MG PO TABS: 40.0000 mg | ORAL_TABLET | Freq: Every day | ORAL | 3 refills | Status: DC

## 2017-02-12 MED ORDER — LISINOPRIL 40 MG PO TABS: 40.0000 mg | ORAL_TABLET | Freq: Every day | ORAL | 2 refills | Status: DC

## 2017-02-12 MED ORDER — AMLODIPINE BESYLATE 10 MG PO TABS
10.0000 mg | ORAL_TABLET | Freq: Every day | ORAL | 2 refills | Status: DC
Start: 2017-02-12 — End: 2018-01-23

## 2017-02-12 NOTE — Assessment & Plan Note (Signed)
Discussed bariatric surgery, she has tried other options Multiple comorbidites She will look into seminar

## 2017-02-12 NOTE — Assessment & Plan Note (Signed)
Uncontrolled and mild tachycardia Add metoprolol 12.5mg  BID  F/u in a few weeks

## 2017-02-12 NOTE — Assessment & Plan Note (Signed)
Goal is A1C < 7% Continue current medications Check renal function Continue discuss dietary changes

## 2017-02-12 NOTE — Assessment & Plan Note (Signed)
Check direct LDL goal is < 100

## 2017-02-15 ENCOUNTER — Other Ambulatory Visit: Payer: Self-pay | Admitting: *Deleted

## 2017-02-15 MED ORDER — ATORVASTATIN CALCIUM 80 MG PO TABS: 80.0000 mg | ORAL_TABLET | Freq: Every day | ORAL | 1 refills | Status: DC

## 2017-03-03 ENCOUNTER — Ambulatory Visit: Payer: BLUE CROSS/BLUE SHIELD | Admitting: Family Medicine

## 2017-03-15 ENCOUNTER — Ambulatory Visit: Payer: Self-pay | Admitting: Family Medicine

## 2017-05-27 LAB — HM MAMMOGRAPHY

## 2017-05-29 ENCOUNTER — Ambulatory Visit (HOSPITAL_COMMUNITY)
Admission: RE | Admit: 2017-05-29 | Discharge: 2017-05-29 | Disposition: A | Discharge status: Home / Self Care | Payer: Managed Care, Other (non HMO) | Source: Ambulatory Visit | Attending: Family | Admitting: Family

## 2017-05-29 ENCOUNTER — Encounter: Payer: Self-pay | Admitting: Family

## 2017-05-29 ENCOUNTER — Ambulatory Visit (INDEPENDENT_AMBULATORY_CARE_PROVIDER_SITE_OTHER): Payer: Managed Care, Other (non HMO) | Admitting: Family

## 2017-05-29 VITALS — BP 157/109 | HR 79 | Temp 97.7°F | Resp 18 | Ht 62.0 in | Wt 248.2 lb

## 2017-05-29 DIAGNOSIS — I872 Venous insufficiency (chronic) (peripheral): Secondary | ICD-10-CM | POA: Diagnosis not present

## 2017-05-29 DIAGNOSIS — M7989 Other specified soft tissue disorders: Secondary | ICD-10-CM | POA: Insufficient documentation

## 2017-05-29 NOTE — Progress Notes (Signed)
[facility]   CC: Follow up left external iliac vein stenosis   History of Present Illness [name] is a 47 y.o. female In December 2008, she underwent stenting of her left external iliac vein using a 12 x 60 Cordis self-expanding stent. This was done for swelling. 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 In July 2014, she underwent angioplasty. In December 2015 she began having recurrence of her symptoms. On 04/15/2014, she underwent balloon venoplasty of in-stent stenosis.  She reports no complaints of swelling today.  Occasionally she will get cramping in her leg.  She does not wear compression stockings.  Dr. Trula Slade last evaluated pt on 05-23-16. At that time he reviewed her ultrasound. Her stent was widely patent. There was no evidence of thrombus within the venous system on the left. Patient remained asymptomatic. She will continue with annual surveillance.  The patient denies dyspnea. The patient does not recall any injury or precipitating factor. She has no swelling in either leg in the morning. She has not worn compression hose since about 2017, states Dr. Trula Slade told her she no longer needs to.   She states her blood pressure at home runs in the 567'O systolic.    Diabetic: Yes, states last A1C improved to 6.4 Tobacco use: non-smoker  Pt meds include: Statin :Yes Betablocker: Yes ASA: Yes Other anticoagulants/antiplatelets: no  Past Medical History:  Diagnosis Date  . Abnormal Pap smear   . Allergy    seasonal  . Atrial fibrillation ([place])   . Diabetes mellitus 2012  . GERD (gastroesophageal reflux disease)   . Hemorrhoids   . Hyperlipidemia 2015  . Hypertension 1995  . Obesity   . Plantar fasciitis   . Sickle cell anemia (HCC)    sickle cell trait  . Sickle cell trait ([place]) 1995    Social History Social History   Tobacco Use  .  Smoking status: Never Smoker  . Smokeless tobacco: Never Used  Substance Use Topics  . Alcohol use: No  . Drug use: No    Family History Family History  Problem Relation Age of Onset  . Diabetes Mother   . Hypertension Mother   . Hyperlipidemia Mother   . Heart disease Sister        [employer]  . Hearing loss Sister   . Learning disabilities Sister   . Arthritis Maternal Grandmother   . Stroke Maternal Grandmother   . Hypertension Other   . Learning disabilities Daughter   . Asthma Maternal Aunt   . Diabetes Maternal Aunt   . Hyperlipidemia Maternal Aunt   . Hypertension Maternal Aunt   . Heart attack Neg Hx   . Sudden death Neg Hx   . Colon cancer Neg Hx   . Esophageal cancer Neg Hx   . Rectal cancer Neg Hx   . Stomach cancer Neg Hx     Past Surgical History:  Procedure Laterality Date  . ABDOMINAL HYSTERECTOMY  2005   for Fibroids  . CESAREAN SECTION    . ILIAC VEIN ANGIOPLASTY / STENTING    . SKIN TAG REMOVAL  Sept. 25, 2015   Right uppper arm  . TUBAL LIGATION    . VENOGRAM N/A 11/07/2012   Procedure: VENOGRAM;  Surgeon: Serafina Mitchell, MD;  Location: [facility] CATH LAB;  Service: Cardiovascular;  Laterality: N/A;  . VENOGRAM N/A 04/15/2014   Procedure:  VENOGRAM;  Surgeon: Serafina Mitchell, MD;  Location: [facility] CATH LAB;  Service: Cardiovascular;  Laterality: N/A;    Allergies  Allergen Reactions  . Other     Pt. States every time she is "put to sleep" she because becomes nauseated and runs a fever"  . Victoza [Liraglutide]     Nausea/vomiting  . Tape Rash    Current Outpatient Medications  Medication Sig Dispense Refill  . amLODipine (NORVASC) 10 MG tablet Take 1 tablet (10 mg total) by mouth daily. 90 tablet 2  . aspirin 81 MG EC tablet Take 81 mg by mouth daily.     Marland Kitchen atorvastatin (LIPITOR) 80 MG tablet Take 1 tablet (80 mg total) by mouth daily. 90 tablet 1  . blood glucose meter kit and supplies Please dispense One Touch Ultra. Use to monitor FSBS  2x daily. Dx: E11.9. 1 each 0  . clotrimazole (LOTRIMIN) 1 % cream Apply 1 application topically 2 (two) times daily. 45 g 0  . diclofenac (VOLTAREN) 75 MG EC tablet Take 1 tablet (75 mg total) by mouth 2 (two) times daily. 60 tablet 0  . Dulaglutide (TRULICITY) 6.06 TK/1.6WF SOPN Inject 0.75 mg into the skin once a week. 1 pen 3  . fluticasone (FLONASE) 50 MCG/ACT nasal spray Place 2 sprays into both nostrils daily. 16 g 2  . Glucose Blood (BLOOD GLUCOSE TEST STRIPS) STRP Use to monitor FSBS 2x daily. Dx E11.9 100 each 0  . hydrochlorothiazide (HYDRODIURIL) 25 MG tablet Take 1 tablet (25 mg total) by mouth daily. 90 tablet 3  . ibuprofen (ADVIL,MOTRIN) 600 MG tablet Take 1 tablet (600 mg total) by mouth every 8 (eight) hours as needed. 30 tablet 0  . Lancet Devices (AUTO-LANCETS) MISC Test blood sugar as directed  Dx 250.00 100 each 12  . Lancets MISC Use to monitor FSBS 2x daily. Dx E11.9 100 each 12  . lidocaine-prilocaine (EMLA) cream Apply 1 application topically as needed. 30 g 2  . lisinopril (PRINIVIL,ZESTRIL) 40 MG tablet Take 1 tablet (40 mg total) by mouth daily. 90 tablet 2  . meloxicam (MOBIC) 15 MG tablet Take 1 tablet (15 mg total) by mouth daily. 30 tablet 0  . metFORMIN (GLUCOPHAGE) 500 MG tablet Take 1 tablet (500 mg total) by mouth 2 (two) times daily with a meal. 180 tablet 2  . metoprolol tartrate (LOPRESSOR) 25 MG tablet Take 0.5 tablets (12.5 mg total) by mouth 2 (two) times daily. 30 tablet 3  . nitroGLYCERIN (NITRODUR - DOSED IN MG/24 HR) 0.2 mg/hr patch 1/4 patch daily 30 patch 1  . omeprazole (PRILOSEC) 40 MG capsule Take 1 capsule (40 mg total) by mouth daily. 90 capsule 2  . promethazine (PHENERGAN) 12.5 MG tablet Take 1 tablet (12.5 mg total) by mouth every 8 (eight) hours as needed for nausea or vomiting. 20 tablet 0  . sitaGLIPtin (JANUVIA) 100 MG tablet Take 1 tablet (100 mg total) by mouth daily. 90 tablet 2  . Vitamin D, Ergocalciferol, (DRISDOL) 50000 units  CAPS capsule Take 1 capsule (50,000 Units total) by mouth every 7 (seven) days. 12 capsule 0   No current facility-administered medications for this visit.     ROS: See HPI for pertinent positives and negatives.   Physical Examination  Vitals:   05/29/17 0848 05/29/17 0851  BP: (!) 154/103 (!) 157/109  Pulse: 79   Resp: 18   Temp: 97.7 F (36.5 C)   TempSrc: Oral   SpO2: 100%   Weight: 248  lb 3.2 oz (112.6 kg)   Height: '5\' 2"'  (1.575 m)    Body mass index is 45.4 kg/m.  General: The patient appears their stated age, morbidly obese female.   HEENT:  No gross abnormalities Pulmonary: Respirations are non-labored, CTAB, good air movement in all fields.  Abdomen: Soft and non-tender with normal bowel sounds. Musculoskeletal: There are no major deformities. No peripheral edema.  Neurologic: No focal weakness or paresthesias are detected. Skin: There are no ulcer or rashes noted, large dark birthmark on right leg. Psychiatric: The patient has normal affect. Cardiovascular: There is a regular rate and rhythm.    Vascular: Vessel Right Left  Radial Palpable Palpable  Aorta Not palpable N/A  Femoral Not Palpable Not Palpable  Popliteal Not palpable Not palpable  PT Not Palpable Not Palpable  DP Palpable Palpable     ASSESSMENT: [name] is a 47 y.o. female who is s/p stenting of her left external iliac vein in 2008. In July 2014 she underwent angioplasty. In December 2015 she began having recurrence of her symptoms. On 04/15/2014, she underwent balloon venoplasty of in-stent stenosis.   She reports no complaints of swelling today.  DATA  IVC/Iliac study: No evidence of thrombus in IVC and the left iliac veins, all appear patent. The iliac stent was not visualized.    PLAN:  Based on the patient's vascular studies and examination, pt will return to clinic in 1 year left IVC iliac vein duplex. I advised her to notify us if she develops swelling in either leg.    Clemon Chambers, RN, MSN, FNP-C [facility] Phone: [phone]  Clinic MD: Trula Slade  05/29/17 8:56 AM

## 2017-05-29 NOTE — Patient Instructions (Addendum)
Before your next abdominal ultrasound:  Take two Extra-Strength Gas-X capsules at bedtime the night before the test. Take another two Extra-Strength Gas-X capsules 3 hours before the test.  Avoid gas forming foods the day before the test.        Chronic Venous Insufficiency Chronic venous insufficiency, also called venous stasis, is a condition that prevents blood from being pumped effectively through the veins in your legs. Blood may no longer be pumped effectively from the legs back to the heart. This condition can range from mild to severe. With proper treatment, you should be able to continue with an active life. What are the causes? Chronic venous insufficiency occurs when the vein walls become stretched, weakened, or damaged, or when valves within the vein are damaged. Some common causes of this include:  High blood pressure inside the veins (venous hypertension).  Increased blood pressure in the leg veins from long periods of sitting or standing.  A blood clot that blocks blood flow in a vein (deep vein thrombosis, DVT).  Inflammation of a vein (phlebitis) that causes a blood clot to form.  Tumors in the pelvis that cause blood to back up.  What increases the risk? The following factors may make you more likely to develop this condition:  Having a family history of this condition.  Obesity.  Pregnancy.  Living without enough physical activity or exercise (sedentary lifestyle).  Smoking.  Having a job that requires long periods of standing or sitting in one place.  Being a certain age. Women in their 18s and 26s and men in their 106s are more likely to develop this condition.  What are the signs or symptoms? Symptoms of this condition include:  Veins that are enlarged, bulging, or twisted (varicose veins).  Skin breakdown or ulcers.  Reddened or discolored skin on the front of the leg.  Brown, smooth, tight, and painful skin just above the ankle, usually on the  inside of the leg (lipodermatosclerosis).  Swelling.  How is this diagnosed? This condition may be diagnosed based on:  Your medical history.  A physical exam.  Tests, such as: ? A procedure that creates an image of a blood vessel and nearby organs and provides information about blood flow through the blood vessel (duplex ultrasound). ? A procedure that tests blood flow (plethysmography). ? A procedure to look at the veins using X-ray and dye (venogram).  How is this treated? The goals of treatment are to help you return to an active life and to minimize pain or disability. Treatment depends on the severity of your condition, and it may include:  Wearing compression stockings. These can help relieve symptoms and help prevent your condition from getting worse. However, they do not cure the condition.  Sclerotherapy. This is a procedure involving an injection of a material that "dissolves" damaged veins.  Surgery. This may involve: ? Removing a diseased vein (vein stripping). ? Cutting off blood flow through the vein (laser ablation surgery). ? Repairing a valve.  Follow these instructions at home:  Wear compression stockings as told by your health care provider. These stockings help to prevent blood clots and reduce swelling in your legs.  Take over-the-counter and prescription medicines only as told by your health care provider.  Stay active by exercising, walking, or doing different activities. Ask your health care provider what activities are safe for you and how much exercise you need.  Drink enough fluid to keep your urine clear or pale yellow.  Do not  use any products that contain nicotine or tobacco, such as cigarettes and e-cigarettes. If you need help quitting, ask your health care provider.  Keep all follow-up visits as told by your health care provider. This is important. Contact a health care provider if:  You have redness, swelling, or more pain in the affected  area.  You see a red streak or line that extends up or down from the affected area.  You have skin breakdown or a loss of skin in the affected area, even if the breakdown is small.  You get an injury in the affected area. Get help right away if:  You get an injury and an open wound in the affected area.  You have severe pain that does not get better with medicine.  You have sudden numbness or weakness in the foot or ankle below the affected area, or you have trouble moving your foot or ankle.  You have a fever and you have worse or persistent symptoms.  You have chest pain.  You have shortness of breath. Summary  Chronic venous insufficiency, also called venous stasis, is a condition that prevents blood from being pumped effectively through the veins in your legs.  Chronic venous insufficiency occurs when the vein walls become stretched, weakened, or damaged, or when valves within the vein are damaged.  Treatment for this condition depends on how severe your condition is, and it may involve wearing compression stockings or having a procedure.  Make sure you stay active by exercising, walking, or doing different activities. Ask your health care provider what activities are safe for you and how much exercise you need. This information is not intended to replace advice given to you by your health care provider. Make sure you discuss any questions you have with your health care provider. Document Released: 08/15/2006 Document Revised: 02/29/2016 Document Reviewed: 02/29/2016 Elsevier Interactive Patient Education  2017 [employer].

## 2017-07-13 ENCOUNTER — Ambulatory Visit: Payer: Self-pay

## 2017-07-13 ENCOUNTER — Encounter: Payer: Self-pay | Admitting: Family Medicine

## 2017-07-13 ENCOUNTER — Ambulatory Visit: Payer: Managed Care, Other (non HMO) | Admitting: Family Medicine

## 2017-07-13 VITALS — BP 102/76 | HR 91 | Wt 248.0 lb

## 2017-07-13 DIAGNOSIS — M79671 Pain in right foot: Secondary | ICD-10-CM | POA: Diagnosis not present

## 2017-07-13 DIAGNOSIS — M67471 Ganglion, right ankle and foot: Secondary | ICD-10-CM | POA: Diagnosis not present

## 2017-07-13 NOTE — Patient Instructions (Addendum)
Good to see you  See me when you need me  Try the cream 1 time a day for next week.  I am hoping it will clear it up

## 2017-07-13 NOTE — Assessment & Plan Note (Signed)
Repeat injection given today.  Discussed icing regimen and home exercises.  Discussed which activities to do which wants to avoid.  Patient to increase activity as tolerated.  Follow-up with me again in 4 weeks

## 2017-07-13 NOTE — Progress Notes (Signed)
[name] [address], [place] [address] Phone: [phone] Subjective:      CC: Foot pain with knot on foot  HAL:[id number]  [name] is a 47 y.o. female coming in with complaint of *foot pain.  Has a history of a right MTP cyst was last injected November 10, 2016.  Patient states that the cyst has been back since the end January. She is having problems putting shoes on due to pressure on 1 MTP.       Past Medical History:  Diagnosis Date  . Abnormal Pap smear   . Allergy    seasonal  . Atrial fibrillation ([place])   . Diabetes mellitus 2012  . GERD (gastroesophageal reflux disease)   . Hemorrhoids   . Hyperlipidemia 2015  . Hypertension 1995  . Obesity   . Plantar fasciitis   . Sickle cell anemia (HCC)    sickle cell trait  . Sickle cell trait ([place]) 1995   Past Surgical History:  Procedure Laterality Date  . ABDOMINAL HYSTERECTOMY  2005   for Fibroids  . CESAREAN SECTION    . ILIAC VEIN ANGIOPLASTY / STENTING    . SKIN TAG REMOVAL  Sept. 25, 2015   Right uppper arm  . TUBAL LIGATION    . VENOGRAM N/A 11/07/2012   Procedure: VENOGRAM;  Surgeon: Serafina Mitchell, MD;  Location: [facility] CATH LAB;  Service: Cardiovascular;  Laterality: N/A;  . VENOGRAM N/A 04/15/2014   Procedure: VENOGRAM;  Surgeon: Serafina Mitchell, MD;  Location: [facility] CATH LAB;  Service: Cardiovascular;  Laterality: N/A;   Social History   Socioeconomic History  . Marital status: Single    Spouse name: Not on file  . Number of children: Not on file  . Years of education: Not on file  . Highest education level: Not on file  Occupational History  . Not on file  Social Needs  . Financial resource strain: Not on file  . Food insecurity:    Worry: Not on file    Inability: Not on file  . Transportation needs:    Medical: Not on file    Non-medical: Not on file  Tobacco Use  . Smoking status: Never Smoker  . Smokeless tobacco: Never Used  Substance and Sexual  Activity  . Alcohol use: No  . Drug use: No  . Sexual activity: Not Currently  Lifestyle  . Physical activity:    Days per week: Not on file    Minutes per session: Not on file  . Stress: Not on file  Relationships  . Social connections:    Talks on phone: Not on file    Gets together: Not on file    Attends religious service: Not on file    Active member of club or organization: Not on file    Attends meetings of clubs or organizations: Not on file    Relationship status: Not on file  Other Topics Concern  . Not on file  Social History Narrative  . Not on file   Allergies  Allergen Reactions  . Other     Pt. States every time she is "put to sleep" she because becomes nauseated and runs a fever"  . Victoza [Liraglutide]     Nausea/vomiting  . Tape Rash   Family History  Problem Relation Age of Onset  . Diabetes Mother   . Hypertension Mother   . Hyperlipidemia Mother   . Heart disease Sister  [employer]  . Hearing loss Sister   . Learning disabilities Sister   . Arthritis Maternal Grandmother   . Stroke Maternal Grandmother   . Hypertension Other   . Learning disabilities Daughter   . Asthma Maternal Aunt   . Diabetes Maternal Aunt   . Hyperlipidemia Maternal Aunt   . Hypertension Maternal Aunt   . Heart attack Neg Hx   . Sudden death Neg Hx   . Colon cancer Neg Hx   . Esophageal cancer Neg Hx   . Rectal cancer Neg Hx   . Stomach cancer Neg Hx      Past medical history, social, surgical and family history all reviewed in electronic medical record.  No pertanent information unless stated regarding to the chief complaint.   Review of Systems:Review of systems updated and as accurate as of 07/13/17  No headache, visual changes, nausea, vomiting, diarrhea, constipation, dizziness, abdominal pain, skin rash, fevers, chills, night sweats, weight loss, swollen lymph nodes, body aches, joint swelling, muscle aches, chest pain, shortness of breath,  mood changes.   Objective  Blood pressure 102/76, pulse 91, weight 248 lb (112.5 kg), SpO2 97 %. Systems examined below as of 07/13/17   General: No apparent distress alert and oriented x3 mood and affect normal, dressed appropriately.  HEENT: Pupils equal, extraocular movements intact  Respiratory: Patient's speak in full sentences and does not appear short of breath  Cardiovascular: No lower extremity edema, non tender, no erythema  Skin: Warm dry intact with no signs of infection or rash on extremities or on axial skeleton.  Abdomen: Soft nontender  Neuro: Cranial nerves II through XII are intact, neurovascularly intact in all extremities with 2+ DTRs and 2+ pulses.  Lymph: No lymphadenopathy of posterior or anterior cervical chain or axillae bilaterally.  Gait normal with good balance and coordination.  MSK:  Non tender with full range of motion and good stability and symmetric strength and tone of shoulders, elbows, wrist, hip, knee and ankles bilaterally.  Foot exam shows the patient does have swelling over the fifth MTP on the right.  Seems to be cystic formation.  Soft to touch.  It is tender.  Procedure: Real-time Ultrasound Guided Injection of right first metatarsal Device: GE Logiq Q7 Ultrasound guided injection is preferred based studies that show increased duration, increased effect, greater accuracy, decreased procedural pain, increased response rate, and decreased cost with ultrasound guided versus blind injection.  Verbal informed consent obtained.  Time-out conducted.  Noted no overlying erythema, induration, or other signs of local infection.  Skin prepped in a sterile fashion.  Local anesthesia: Topical Ethyl chloride.  With sterile technique and under real time ultrasound guidance: With a 25-gauge half inch needle patient was injected with a total of 0.5 cc of 0.5% Marcaine and 0.5 cc of Kenalog 40 mg/mL Completed without difficulty  Pain immediately resolved  suggesting accurate placement of the medication.  Advised to call if fevers/chills, erythema, induration, drainage, or persistent bleeding.  Images permanently stored and available for review in the ultrasound unit.  Impression: Technically successful ultrasound guided injection.   Impression and Recommendations:     This case required medical decision making of moderate complexity.      Note: This dictation was prepared with Dragon dictation along with smaller phrase technology. Any transcriptional errors that result from this process are unintentional.

## 2017-07-28 ENCOUNTER — Ambulatory Visit: Payer: Managed Care, Other (non HMO) | Admitting: Family Medicine

## 2017-07-28 ENCOUNTER — Encounter: Payer: Self-pay | Admitting: Family Medicine

## 2017-07-28 VITALS — BP 110/90 | HR 91 | Temp 98.4°F | Resp 16 | Ht 62.0 in | Wt 250.8 lb

## 2017-07-28 DIAGNOSIS — E66813 Obesity, class 3: Secondary | ICD-10-CM

## 2017-07-28 DIAGNOSIS — E1165 Type 2 diabetes mellitus with hyperglycemia: Secondary | ICD-10-CM

## 2017-07-28 DIAGNOSIS — Z6841 Body Mass Index (BMI) 40.0 and over, adult: Secondary | ICD-10-CM

## 2017-07-28 DIAGNOSIS — I10 Essential (primary) hypertension: Secondary | ICD-10-CM | POA: Diagnosis not present

## 2017-07-28 DIAGNOSIS — E782 Mixed hyperlipidemia: Secondary | ICD-10-CM | POA: Diagnosis not present

## 2017-07-28 DIAGNOSIS — I739 Peripheral vascular disease, unspecified: Secondary | ICD-10-CM

## 2017-07-28 NOTE — Patient Instructions (Addendum)
Release of records- [facility] F/U 3 months for Physical

## 2017-07-28 NOTE — Progress Notes (Signed)
   Subjective:    Patient ID: [name], female    DOB: [birth year], 47 y.o.   MRN: [id number]  Patient presents for follow up on blood pressure  Pt here to discuss bariatric surgery , she went to seminar has forms for me to complete    Has tried: [facility] 2012/2018- lost about 10 lbs, then regained  Slim Fast- didn't  Work and [employer] 2018 - no significant weight loss  Working out at [employer]  HTN- Bp has been on lower end but she feels fine, taking meds as prescribed    DM- not checking blood sugar , last A1C 6.4%, no hypoglycemia symptoms  Review Of Systems:  GEN- denies fatigue, fever, weight loss,weakness, recent illness HEENT- denies eye drainage, change in vision, nasal discharge, CVS- denies chest pain, palpitations RESP- denies SOB, cough, wheeze ABD- denies N/V, change in stools, abd pain GU- denies dysuria, hematuria, dribbling, incontinence MSK- +joint pain denies, muscle aches, injury Neuro- denies headache, dizziness, syncope, seizure activity       Objective:    BP 110/90   Pulse 91   Temp 98.4 F (36.9 C) (Oral)   Resp 16   Ht 5\' 2"  (1.575 m)   Wt 250 lb 12.8 oz (113.8 kg)   SpO2 98%   BMI 45.87 kg/m  GEN- NAD, alert and oriented x3 HEENT- PERRL, EOMI, non injected sclera, pink conjunctiva, MMM, oropharynx clear Neck- Supple, no thyromegaly CVS- RRR, no murmur RESP-CTAB ABD-NABS,soft,NT,ND EXT- No edema Pulses- Radial, DP- 2+        Assessment & Plan:      Problem List Items Addressed This Visit      Unprioritized   Hyperlipidemia   Peripheral vascular disease, unspecified ([place])    Doing well with her remote lower ext vein stenting, swelling controlled      Obesity - Primary    She has not been prescribed prescription meds, a few due to SE with her current meds She would be a good candidate for weight loss surgery      HYPERTENSION, BENIGN SYSTEMIC (Chronic)    Well controlled, no changes       Relevant Orders   Comprehensive metabolic panel (Completed)   Diabetes mellitus (HCC) (Chronic)    Goal is A1C < 7%, discussed checking CBG fasting to keep her accountable On ACEI and statin      Relevant Orders   CBC with Differential/Platelet (Completed)   Comprehensive metabolic panel (Completed)   Hemoglobin A1c (Completed)   HM DIABETES FOOT EXAM (Completed)      Note: This dictation was prepared with Dragon dictation along with smaller phrase technology. Any transcriptional errors that result from this process are unintentional.

## 2017-07-29 LAB — CBC WITH DIFFERENTIAL/PLATELET
BASOS PCT: 0.3 %
Basophils Absolute: 27 cells/uL (ref 0–200)
EOS PCT: 2.6 %
Eosinophils Absolute: 231 cells/uL (ref 15–500)
HCT: 43.3 % (ref 35.0–45.0)
HEMOGLOBIN: 14.4 g/dL (ref 11.7–15.5)
LYMPHS ABS: 2946 {cells}/uL (ref 850–3900)
MCH: 27.7 pg (ref 27.0–33.0)
MCHC: 33.3 g/dL (ref 32.0–36.0)
MCV: 83.4 fL (ref 80.0–100.0)
MONOS PCT: 8.2 %
MPV: 11.3 fL (ref 7.5–12.5)
NEUTROS ABS: 4966 {cells}/uL (ref 1500–7800)
Neutrophils Relative %: 55.8 %
Platelets: 287 10*3/uL (ref 140–400)
RBC: 5.19 10*6/uL — AB (ref 3.80–5.10)
RDW: 12.9 % (ref 11.0–15.0)
Total Lymphocyte: 33.1 %
WBC mixed population: 730 cells/uL (ref 200–950)
WBC: 8.9 10*3/uL (ref 3.8–10.8)

## 2017-07-29 LAB — HEMOGLOBIN A1C
EAG (MMOL/L): 8.1 (calc)
Hgb A1c MFr Bld: 6.7 % of total Hgb — ABNORMAL HIGH (ref <5.7)
Mean Plasma Glucose: 146 (calc)

## 2017-07-29 LAB — COMPREHENSIVE METABOLIC PANEL
AG Ratio: 1.2 (calc) (ref 1.0–2.5)
ALBUMIN MSPROF: 4 g/dL (ref 3.6–5.1)
ALT: 18 U/L (ref 6–29)
AST: 15 U/L (ref 10–35)
Alkaline phosphatase (APISO): 76 U/L (ref 33–115)
BUN / CREAT RATIO: 10 (calc) (ref 6–22)
BUN: 11 mg/dL (ref 7–25)
CO2: 28 mmol/L (ref 20–32)
Calcium: 9.6 mg/dL (ref 8.6–10.2)
Chloride: 100 mmol/L (ref 98–110)
Creat: 1.14 mg/dL — ABNORMAL HIGH (ref 0.50–1.10)
GLOBULIN: 3.3 g/dL (ref 1.9–3.7)
GLUCOSE: 166 mg/dL — AB (ref 65–99)
POTASSIUM: 5.1 mmol/L (ref 3.5–5.3)
SODIUM: 136 mmol/L (ref 135–146)
TOTAL PROTEIN: 7.3 g/dL (ref 6.1–8.1)
Total Bilirubin: 0.4 mg/dL (ref 0.2–1.2)

## 2017-07-30 ENCOUNTER — Encounter: Payer: Self-pay | Admitting: Family Medicine

## 2017-07-30 NOTE — Assessment & Plan Note (Signed)
Doing well with her remote lower ext vein stenting, swelling controlled

## 2017-07-30 NOTE — Assessment & Plan Note (Signed)
Goal is A1C < 7%, discussed checking CBG fasting to keep her accountable On ACEI and statin

## 2017-07-30 NOTE — Assessment & Plan Note (Signed)
Well controlled, no changes 

## 2017-07-30 NOTE — Assessment & Plan Note (Signed)
She has not been prescribed prescription meds, a few due to SE with her current meds She would be a good candidate for weight loss surgery

## 2017-08-06 ENCOUNTER — Encounter: Payer: Self-pay | Admitting: Family Medicine

## 2017-08-08 ENCOUNTER — Other Ambulatory Visit: Payer: Self-pay | Admitting: Family Medicine

## 2017-08-24 ENCOUNTER — Other Ambulatory Visit: Payer: Self-pay | Admitting: Family Medicine

## 2017-08-25 LAB — HM DIABETES EYE EXAM

## 2017-09-01 ENCOUNTER — Other Ambulatory Visit: Payer: Self-pay | Admitting: Family Medicine

## 2017-09-09 ENCOUNTER — Other Ambulatory Visit: Payer: Self-pay | Admitting: Family Medicine

## 2017-09-13 ENCOUNTER — Telehealth: Payer: Self-pay | Admitting: *Deleted

## 2017-09-13 NOTE — Telephone Encounter (Signed)
Patient called answering service wanting appointment to have stent checked. Patient c/o tightness, swelling and cramps when walking. Bilateral lower legs left >right. Encouraged to continue support stockings and aggreeable to see PA on 09/14/17.

## 2017-09-14 ENCOUNTER — Ambulatory Visit: Payer: Managed Care, Other (non HMO)

## 2017-09-14 ENCOUNTER — Telehealth: Payer: Self-pay | Admitting: *Deleted

## 2017-09-14 NOTE — Telephone Encounter (Signed)
Order for [place] IVC/left iliac per [employer]. Call to patient and instructed NPO except sips of water for medication am of 5.29.19 for procedure at 9 am. Then will see PA at 2:30 pm on 09/20/17. Cancel 09/14/17 appt with PA. Patient agreeable toa all and verbalized understanding. Instructed to report to ER at [facility] for any worsening/emergency medical condition.

## 2017-09-19 ENCOUNTER — Other Ambulatory Visit: Payer: Self-pay

## 2017-09-19 DIAGNOSIS — I872 Venous insufficiency (chronic) (peripheral): Secondary | ICD-10-CM

## 2017-09-20 ENCOUNTER — Ambulatory Visit (HOSPITAL_COMMUNITY)
Admission: RE | Admit: 2017-09-20 | Discharge: 2017-09-20 | Disposition: A | Discharge status: Home / Self Care | Payer: Managed Care, Other (non HMO) | Source: Ambulatory Visit | Attending: Vascular Surgery | Admitting: Vascular Surgery

## 2017-09-20 ENCOUNTER — Other Ambulatory Visit: Payer: Self-pay

## 2017-09-20 ENCOUNTER — Ambulatory Visit: Payer: Managed Care, Other (non HMO) | Admitting: Physician Assistant

## 2017-09-20 ENCOUNTER — Other Ambulatory Visit: Payer: Self-pay | Admitting: *Deleted

## 2017-09-20 ENCOUNTER — Encounter: Payer: Self-pay | Admitting: Physician Assistant

## 2017-09-20 VITALS — BP 144/93 | HR 103 | Temp 98.8°F | Resp 20 | Ht 62.0 in | Wt 250.0 lb

## 2017-09-20 DIAGNOSIS — I872 Venous insufficiency (chronic) (peripheral): Secondary | ICD-10-CM

## 2017-09-20 DIAGNOSIS — I10 Essential (primary) hypertension: Secondary | ICD-10-CM | POA: Insufficient documentation

## 2017-09-20 DIAGNOSIS — R252 Cramp and spasm: Secondary | ICD-10-CM

## 2017-09-20 DIAGNOSIS — E669 Obesity, unspecified: Secondary | ICD-10-CM | POA: Diagnosis not present

## 2017-09-20 DIAGNOSIS — E785 Hyperlipidemia, unspecified: Secondary | ICD-10-CM | POA: Insufficient documentation

## 2017-09-20 MED ORDER — DULAGLUTIDE 0.75 MG/0.5ML ~~LOC~~ SOAJ: SUBCUTANEOUS | 3 refills | Status: DC

## 2017-09-20 MED ORDER — METOPROLOL TARTRATE 25 MG PO TABS: 12.5000 mg | ORAL_TABLET | Freq: Two times a day (BID) | ORAL | 3 refills | Status: DC

## 2017-09-20 NOTE — Progress Notes (Signed)
Established Venous Insufficiency   History of Present Illness   [name] is a 47 y.o. ([birth year]) female who presents with chief complaint of tightness and cramping in L calf.  She is followed for chronic venous insufficiency with history of left external iliac vein stenting in December 2008.  In July 2014 she underwent angioplasty of iliac vein stent and again in December 2015 by Dr. Trula Slade.  She was last seen in office in February of this year however over the past month has noticed an increase in tightness of her left calf as well as concurrent cramping.  She believes this is likely due to her increase in walking for exercise as well as due to the warmer weather however she would like her stent checked.  She denies any nonhealing wounds or venous ulcerations.  For her occupation she is required to be on her feet and sitting at a desk throughout most of the day.  She does not wear compression stockings.  She is taking a daily aspirin and statin.  The patient's PMH, PSH, SH, and FamHx were reviewed on today's visit and are unchanged from prior visit.  Current Outpatient Medications  Medication Sig Dispense Refill  . amLODipine (NORVASC) 10 MG tablet Take 1 tablet (10 mg total) by mouth daily. 90 tablet 2  . aspirin 81 MG EC tablet Take 81 mg by mouth daily.     Marland Kitchen atorvastatin (LIPITOR) 80 MG tablet Take 1 tablet (80 mg total) by mouth daily. 90 tablet 1  . blood glucose meter kit and supplies Please dispense One Touch Ultra. Use to monitor FSBS 2x daily. Dx: E11.9. 1 each 0  . clotrimazole (LOTRIMIN) 1 % cream Apply 1 application topically 2 (two) times daily. 45 g 0  . diclofenac (VOLTAREN) 75 MG EC tablet Take 1 tablet (75 mg total) by mouth 2 (two) times daily. 60 tablet 0  . fluticasone (FLONASE) 50 MCG/ACT nasal spray Place 2 sprays into both nostrils daily. 16 g 2  . Glucose Blood (BLOOD GLUCOSE TEST STRIPS) STRP Use to monitor FSBS 2x daily. Dx E11.9 100 each 0  .  hydrochlorothiazide (HYDRODIURIL) 25 MG tablet Take 1 tablet (25 mg total) by mouth daily. 90 tablet 3  . ibuprofen (ADVIL,MOTRIN) 600 MG tablet Take 1 tablet (600 mg total) by mouth every 8 (eight) hours as needed. 30 tablet 0  . Lancet Devices (AUTO-LANCETS) MISC Test blood sugar as directed  Dx 250.00 100 each 12  . Lancets MISC Use to monitor FSBS 2x daily. Dx E11.9 100 each 12  . lidocaine-prilocaine (EMLA) cream Apply 1 application topically as needed. 30 g 2  . lisinopril (PRINIVIL,ZESTRIL) 40 MG tablet Take 1 tablet (40 mg total) by mouth daily. 90 tablet 2  . meloxicam (MOBIC) 15 MG tablet Take 1 tablet (15 mg total) by mouth daily. 30 tablet 0  . metFORMIN (GLUCOPHAGE) 500 MG tablet TAKE 1 TABLET BY MOUTH TWICE A DAY WITH MEALS 180 tablet 0  . metoprolol tartrate (LOPRESSOR) 25 MG tablet TAKE 1/2 TABLET BY MOUTH TWICE DAILY 30 tablet 0  . nitroGLYCERIN (NITRODUR - DOSED IN MG/24 HR) 0.2 mg/hr patch 1/4 patch daily 30 patch 1  . omeprazole (PRILOSEC) 40 MG capsule Take 1 capsule (40 mg total) by mouth daily. 90 capsule 2  . promethazine (PHENERGAN) 12.5 MG tablet Take 1 tablet (12.5 mg total) by mouth every 8 (eight) hours as needed for nausea or vomiting. 20 tablet 0  . sitaGLIPtin (JANUVIA) 100  MG tablet Take 1 tablet (100 mg total) by mouth daily. 90 tablet 2  . TRULICITY 8.65 HQ/4.6NG SOPN INJECT 0.75 MILLIGRAM SUBCUTANEOUSLY EVERY WEEK 2 mL 0  . Vitamin D, Ergocalciferol, (DRISDOL) 50000 units CAPS capsule Take 1 capsule (50,000 Units total) by mouth every 7 (seven) days. 12 capsule 0   No current facility-administered medications for this visit.     Allergies  Allergen Reactions  . Other     Pt. States every time she is "put to sleep" she because becomes nauseated and runs a fever"  . Victoza [Liraglutide]     Nausea/vomiting  . Tape Rash    On ROS today: 10 system ROS is negative unless otherwise noted in HPI   Physical Examination   Vitals:   09/20/17 1417  BP:  (!) 144/93  Pulse: (!) 103  Resp: 20  Temp: 98.8 F (37.1 C)  TempSrc: Oral  SpO2: 96%  Weight: 250 lb (113.4 kg)  Height: '5\' 2"'  (1.575 m)   Body mass index is 45.73 kg/m.  General Alert, O x 3, Obese, NAD  Pulmonary Sym exp, good B air movt,   Cardiac RRR, Nl S1, S2,  Vascular Vessel Right Left  Radial Palpable Palpable  PT Not palpable Not palpable  DP Palpable Palpable    Gastro- intestinal soft, non-distended, non-tender to palpation,   Musculo- skeletal M/S 5/5 throughout  , Extremities without ischemic changes  , No notable edema left leg compared to right, No visible varicosities , No Lipodermatosclerosis present No L calf pain with dorsiflexion of foot and palpation of calf  Neurologic Cranial nerves 2-12 intact ,      Non-Invasive Vascular Imaging   LLE Venous Duplex:     Negative for DVT and SVT,   L EIV stent patent   Medical Decision Making   [name] is a 47 y.o. female who presents with tightness L calf   L EIV stent patent on venous duplex in office  No calf pain on exam today  Encouraged use of compression LLE; re-measured today for BK stocking  Follow up in 1 year with repeat IVC and iliac duplex  Return to office/report to ER if symptoms worsen and concerned for DVT   Dagoberto Ligas, PA-C [facility] Office: [phone]

## 2017-09-20 NOTE — Progress Notes (Signed)
Per PA-C, I measured patient's left thigh (67cm), calf (46cm), and ankle (27.5cm).   Thurston Hole., LPN

## 2017-09-26 ENCOUNTER — Other Ambulatory Visit: Payer: Self-pay | Admitting: Family Medicine

## 2017-09-28 ENCOUNTER — Other Ambulatory Visit: Payer: Self-pay | Admitting: Family Medicine

## 2017-10-18 ENCOUNTER — Other Ambulatory Visit: Payer: Self-pay | Admitting: Family Medicine

## 2017-11-07 ENCOUNTER — Encounter: Payer: Self-pay | Admitting: *Deleted

## 2017-11-07 ENCOUNTER — Ambulatory Visit (INDEPENDENT_AMBULATORY_CARE_PROVIDER_SITE_OTHER): Payer: Managed Care, Other (non HMO) | Admitting: Family Medicine

## 2017-11-07 ENCOUNTER — Other Ambulatory Visit: Payer: Self-pay

## 2017-11-07 ENCOUNTER — Encounter: Payer: Self-pay | Admitting: Family Medicine

## 2017-11-07 VITALS — BP 132/80 | HR 74 | Temp 98.7°F | Resp 14 | Ht 62.0 in | Wt 254.0 lb

## 2017-11-07 DIAGNOSIS — Z0000 Encounter for general adult medical examination without abnormal findings: Secondary | ICD-10-CM | POA: Diagnosis not present

## 2017-11-07 DIAGNOSIS — E559 Vitamin D deficiency, unspecified: Secondary | ICD-10-CM | POA: Diagnosis not present

## 2017-11-07 DIAGNOSIS — N182 Chronic kidney disease, stage 2 (mild): Secondary | ICD-10-CM

## 2017-11-07 DIAGNOSIS — I872 Venous insufficiency (chronic) (peripheral): Secondary | ICD-10-CM | POA: Diagnosis not present

## 2017-11-07 DIAGNOSIS — I10 Essential (primary) hypertension: Secondary | ICD-10-CM

## 2017-11-07 DIAGNOSIS — E782 Mixed hyperlipidemia: Secondary | ICD-10-CM | POA: Diagnosis not present

## 2017-11-07 DIAGNOSIS — Z6841 Body Mass Index (BMI) 40.0 and over, adult: Secondary | ICD-10-CM

## 2017-11-07 DIAGNOSIS — E66813 Obesity, class 3: Secondary | ICD-10-CM

## 2017-11-07 DIAGNOSIS — E1122 Type 2 diabetes mellitus with diabetic chronic kidney disease: Secondary | ICD-10-CM

## 2017-11-07 MED ORDER — FUROSEMIDE 20 MG PO TABS: 20.0000 mg | ORAL_TABLET | Freq: Every day | ORAL | 3 refills | Status: DC | PRN

## 2017-11-07 NOTE — Assessment & Plan Note (Signed)
Weight up a few pounds,  No exercise Continue to work on diet

## 2017-11-07 NOTE — Patient Instructions (Addendum)
Release of records- [facility]  Lasix as needed  F/U 4 months

## 2017-11-07 NOTE — Assessment & Plan Note (Signed)
Discussed checking CBG at least twice a week fasting Continue current meds Goal < 7%

## 2017-11-07 NOTE — Assessment & Plan Note (Signed)
Unable to wear compression hose due to foot irritation/rash Will give lasix 20mg , to use prn, she is not to take HCTZ when she uses this

## 2017-11-07 NOTE — Progress Notes (Signed)
Subjective:    Patient ID: [name], female    DOB: August 21, 1970, 47 y.o.   MRN: [id number]  Patient presents for CPE (is fasting)  Pt here for CPE Medications reviewed No PAP Smear needed, hystrectomy Immunizations- UTD  Seen by vascular, has had increased swelling in feet, has [place], her stent is patent in Lower ext  Tried compression hose, made eczema on foot itch worse Currently on HCTZ   Seen by dermatology- told eczema and possible psoriasis for hand and feet , given topical steroid  Has f/u in 1 month for the new medication  DM- last A1C 6.7% in April, she is talking trulicity, metormin and Januvia , has had eye appt, last checked sugar 1 month ago, on meter  Vitamin D def- has completed 50,000 weekly given by sports medicine, has not taken the daily vitamin D since stopping the other    Review Of Systems:  GEN- denies fatigue, fever, weight loss,weakness, recent illness HEENT- denies eye drainage, change in vision, nasal discharge, CVS- denies chest pain, palpitations RESP- denies SOB, cough, wheeze ABD- denies N/V, change in stools, abd pain GU- denies dysuria, hematuria, dribbling, incontinence MSK- denies joint pain, muscle aches, injury Neuro- denies headache, dizziness, syncope, seizure activity       Objective:    BP 132/80   Pulse 74   Temp 98.7 F (37.1 C) (Oral)   Resp 14   Ht 5\' 2"  (1.575 m)   Wt 254 lb (115.2 kg)   SpO2 99%   BMI 46.46 kg/m  GEN- NAD, alert and oriented x3 HEENT- PERRL, EOMI, non injected sclera, pink conjunctiva, MMM, oropharynx clear Neck- Supple, no thyromegaly CVS- RRR, no murmur RESP-CTAB ABD-NABS,soft,NT,ND Skin- multiple nevi all over includind face, very large nevus on right leg ( birth mark), dry skin, few blisters inner left foot  EXT- pedal edema to mid shin Pulses- Radial, DP- 2+        Assessment & Plan:      Problem List Items Addressed This Visit      Unprioritized   Chronic venous insufficiency   Unable to wear compression hose due to foot irritation/rash Will give lasix 20mg , to use prn, she is not to take HCTZ when she uses this      Relevant Medications   furosemide (LASIX) 20 MG tablet   CKD (chronic kidney disease) stage 2, 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)   Diabetes mellitus (HCC) (Chronic)    Discussed checking CBG at least twice a week fasting Continue current meds Goal < 7%      Relevant Orders   Hemoglobin A1c   Hyperlipidemia   Relevant Medications   furosemide (LASIX) 20 MG tablet   Other Relevant Orders   Lipid panel   HYPERTENSION, BENIGN SYSTEMIC (Chronic)    Controlled no changes       Relevant Medications   furosemide (LASIX) 20 MG tablet   Obesity    Weight up a few pounds,  No exercise Continue to work on diet       Vitamin D deficiency   Relevant Orders   Vitamin D, 25-hydroxy    Other Visit Diagnoses    Routine general medical examination at a health care facility    -  Primary   CPE done, fasting labs, HIV screening, obtain Mammogram from [facility] and eye report    Relevant Orders   HIV antibody   CBC with Differential/Platelet   Comprehensive metabolic panel      Note: This  dictation was prepared with Dragon dictation along with smaller phrase technology. Any transcriptional errors that result from this process are unintentional.

## 2017-11-07 NOTE — Assessment & Plan Note (Signed)
Controlled no changes 

## 2017-11-08 LAB — CBC WITH DIFFERENTIAL/PLATELET
BASOS PCT: 0.5 %
Basophils Absolute: 39 cells/uL (ref 0–200)
EOS PCT: 2.3 %
Eosinophils Absolute: 177 cells/uL (ref 15–500)
HCT: 41.6 % (ref 35.0–45.0)
Hemoglobin: 13.8 g/dL (ref 11.7–15.5)
Lymphs Abs: 2164 cells/uL (ref 850–3900)
MCH: 27.8 pg (ref 27.0–33.0)
MCHC: 33.2 g/dL (ref 32.0–36.0)
MCV: 83.9 fL (ref 80.0–100.0)
MONOS PCT: 8.5 %
MPV: 11.6 fL (ref 7.5–12.5)
NEUTROS ABS: 4666 {cells}/uL (ref 1500–7800)
Neutrophils Relative %: 60.6 %
Platelets: 265 10*3/uL (ref 140–400)
RBC: 4.96 10*6/uL (ref 3.80–5.10)
RDW: 13.2 % (ref 11.0–15.0)
Total Lymphocyte: 28.1 %
WBC mixed population: 655 cells/uL (ref 200–950)
WBC: 7.7 10*3/uL (ref 3.8–10.8)

## 2017-11-08 LAB — LIPID PANEL
Cholesterol: 113 mg/dL (ref <200)
HDL: 32 mg/dL — ABNORMAL LOW (ref >50)
LDL CHOLESTEROL (CALC): 67 mg/dL
NON-HDL CHOLESTEROL (CALC): 81 mg/dL (ref <130)
TRIGLYCERIDES: 65 mg/dL (ref <150)
Total CHOL/HDL Ratio: 3.5 (calc) (ref <5.0)

## 2017-11-08 LAB — COMPREHENSIVE METABOLIC PANEL
AG Ratio: 1.2 (calc) (ref 1.0–2.5)
ALBUMIN MSPROF: 3.8 g/dL (ref 3.6–5.1)
ALT: 13 U/L (ref 6–29)
AST: 13 U/L (ref 10–35)
Alkaline phosphatase (APISO): 83 U/L (ref 33–115)
BUN: 12 mg/dL (ref 7–25)
CO2: 27 mmol/L (ref 20–32)
Calcium: 9.3 mg/dL (ref 8.6–10.2)
Chloride: 103 mmol/L (ref 98–110)
Creat: 1.05 mg/dL (ref 0.50–1.10)
GLUCOSE: 128 mg/dL — AB (ref 65–99)
Globulin: 3.3 g/dL (calc) (ref 1.9–3.7)
POTASSIUM: 4.3 mmol/L (ref 3.5–5.3)
SODIUM: 136 mmol/L (ref 135–146)
TOTAL PROTEIN: 7.1 g/dL (ref 6.1–8.1)
Total Bilirubin: 0.5 mg/dL (ref 0.2–1.2)

## 2017-11-08 LAB — VITAMIN D 25 HYDROXY (VIT D DEFICIENCY, FRACTURES): Vit D, 25-Hydroxy: 28 ng/mL — ABNORMAL LOW (ref 30–100)

## 2017-11-08 LAB — HEMOGLOBIN A1C
Hgb A1c MFr Bld: 7.1 % of total Hgb — ABNORMAL HIGH (ref <5.7)
Mean Plasma Glucose: 157 (calc)
eAG (mmol/L): 8.7 (calc)

## 2017-11-08 LAB — HIV ANTIBODY (ROUTINE TESTING W REFLEX): HIV 1&2 Ab, 4th Generation: NONREACTIVE

## 2017-11-09 ENCOUNTER — Encounter: Payer: Self-pay | Admitting: *Deleted

## 2017-11-10 ENCOUNTER — Other Ambulatory Visit: Payer: Self-pay | Admitting: *Deleted

## 2017-11-10 MED ORDER — DULAGLUTIDE 1.5 MG/0.5ML ~~LOC~~ SOAJ: 1.5000 mg | SUBCUTANEOUS | 3 refills | Status: DC

## 2017-11-14 ENCOUNTER — Other Ambulatory Visit: Payer: Self-pay | Admitting: Family Medicine

## 2017-11-20 ENCOUNTER — Encounter: Payer: Self-pay | Admitting: *Deleted

## 2017-11-26 ENCOUNTER — Other Ambulatory Visit: Payer: Self-pay | Admitting: Family Medicine

## 2017-12-12 ENCOUNTER — Other Ambulatory Visit: Payer: Self-pay | Admitting: Family Medicine

## 2018-01-23 ENCOUNTER — Other Ambulatory Visit: Payer: Self-pay | Admitting: Family Medicine

## 2018-01-23 DIAGNOSIS — I10 Essential (primary) hypertension: Secondary | ICD-10-CM

## 2018-02-01 ENCOUNTER — Other Ambulatory Visit: Payer: Self-pay | Admitting: Family Medicine

## 2018-02-22 ENCOUNTER — Other Ambulatory Visit: Payer: Self-pay | Admitting: Family Medicine

## 2018-03-25 ENCOUNTER — Other Ambulatory Visit: Payer: Self-pay | Admitting: Family Medicine

## 2018-04-23 ENCOUNTER — Other Ambulatory Visit: Payer: Self-pay | Admitting: Family Medicine

## 2018-04-23 DIAGNOSIS — I10 Essential (primary) hypertension: Secondary | ICD-10-CM

## 2018-05-28 ENCOUNTER — Encounter: Payer: Self-pay | Admitting: Family Medicine

## 2018-05-28 ENCOUNTER — Other Ambulatory Visit: Payer: Self-pay

## 2018-05-28 ENCOUNTER — Ambulatory Visit: Payer: Managed Care, Other (non HMO) | Admitting: Family Medicine

## 2018-05-28 VITALS — BP 142/80 | HR 90 | Temp 98.2°F | Resp 14 | Ht 62.0 in | Wt 250.0 lb

## 2018-05-28 DIAGNOSIS — N182 Chronic kidney disease, stage 2 (mild): Secondary | ICD-10-CM

## 2018-05-28 DIAGNOSIS — E1122 Type 2 diabetes mellitus with diabetic chronic kidney disease: Secondary | ICD-10-CM

## 2018-05-28 DIAGNOSIS — I872 Venous insufficiency (chronic) (peripheral): Secondary | ICD-10-CM

## 2018-05-28 DIAGNOSIS — E782 Mixed hyperlipidemia: Secondary | ICD-10-CM

## 2018-05-28 DIAGNOSIS — I10 Essential (primary) hypertension: Secondary | ICD-10-CM

## 2018-05-28 MED ORDER — ATORVASTATIN CALCIUM 40 MG PO TABS: 40.0000 mg | ORAL_TABLET | Freq: Every day | ORAL | 3 refills | Status: DC

## 2018-05-28 MED ORDER — GLIPIZIDE 10 MG PO TABS: 10.0000 mg | ORAL_TABLET | Freq: Two times a day (BID) | ORAL | 3 refills | Status: DC

## 2018-05-28 MED ORDER — LISINOPRIL 40 MG PO TABS: 40.0000 mg | ORAL_TABLET | Freq: Every day | ORAL | 3 refills | Status: DC

## 2018-05-28 MED ORDER — AMLODIPINE BESYLATE 10 MG PO TABS: 10.0000 mg | ORAL_TABLET | Freq: Every day | ORAL | 3 refills | Status: DC

## 2018-05-28 MED ORDER — METFORMIN HCL 500 MG PO TABS: 500.0000 mg | ORAL_TABLET | Freq: Two times a day (BID) | ORAL | 3 refills | Status: DC

## 2018-05-28 MED ORDER — OMEPRAZOLE 40 MG PO CPDR: 40.0000 mg | DELAYED_RELEASE_CAPSULE | Freq: Every day | ORAL | 3 refills | Status: DC

## 2018-05-28 MED ORDER — FUROSEMIDE 20 MG PO TABS: ORAL_TABLET | ORAL | 3 refills | Status: DC

## 2018-05-28 NOTE — Patient Instructions (Signed)
Check into orange card New prescriptions given  F/U pending insurance

## 2018-05-28 NOTE — Assessment & Plan Note (Signed)
Blood pressure is mildly elevated.  Today.  She has not been on the metoprolol for many months.  For now we will continue her on the Lasix amlodipine lisinopril Time spent going over local [facility] list to get her medications for sleep as possible.  She is also been a look into the [facility] assistance program or the health department Unable to get any fasting labs today secondary to out-of-pocket expense

## 2018-05-28 NOTE — Progress Notes (Signed)
Subjective:    Patient ID: [name], female    DOB: 26-Jun-1970, 48 y.o.   MRN: [id number]  Patient presents for BP/ BS Issues (has noted increased BP and sugars- of note, currently out of insurance)  Patient here to follow-up chronic medical problems.  Her last visit was in July 2019 Diabetes mellitus she states her blood sugars have been elevated.  She is currently on Trulicity 1.5 mg weekly as well as metformin milligrams twice a day and Januvia 100 mg daily.  She does not tolerate higher doses of metformin.  Her last A1c in July was 7.1%.  Her lipids were normal with an LDL of 67   - unfortunatley she is uninsured now due to a mixup with her job.    She is still on metformin, she tried to go up to 1000mg  BID but started having a lot of diarrhea, CBG recently 243, 250, prior to increasing metformin 300's , she is also out of [place], Trulicity  Hypertension- she is taking hydrochlorothiazide as well as lisinopril and amlodipine daily she is also on baby aspirin. She was on metoprolol 12.5mg  twice a day but has been off this since September   He has chronic renal insufficiency followed by vascular she has had a stent placed in the past. Review Of Systems:  GEN- denies fatigue, fever, weight loss,weakness, recent illness HEENT- denies eye drainage, change in vision, nasal discharge, CVS- denies chest pain, palpitations RESP- denies SOB, cough, wheeze ABD- denies N/V, change in stools, abd pain GU- denies dysuria, hematuria, dribbling, incontinence MSK- denies joint pain, muscle aches, injury Neuro- denies headache, dizziness, syncope, seizure activity       Objective:    BP (!) 142/80   Pulse 90   Temp 98.2 F (36.8 C) (Oral)   Resp 14   Ht 5\' 2"  (1.575 m)   Wt 250 lb (113.4 kg)   SpO2 100%   BMI 45.73 kg/m  GEN- NAD, alert and oriented x3 HEENT- PERRL, EOMI, non injected sclera, pink conjunctiva, MMM, oropharynx clear Neck- Supple, no thyromegaly CVS- RRR, no  murmur RESP-CTAB ABD-NABS,soft,NT,ND EXT- trace ankle  edema Pulses- Radial, DP- 2+        Assessment & Plan:      Problem List Items Addressed This Visit      Unprioritized   Chronic venous insufficiency    Lasix daily      Relevant Medications   atorvastatin (LIPITOR) 40 MG tablet   furosemide (LASIX) 20 MG tablet   lisinopril (PRINIVIL,ZESTRIL) 40 MG tablet   amLODipine (NORVASC) 10 MG tablet   Diabetes mellitus (HCC) (Chronic)    Unable to afford Januvia and Trulicity without insurance.  Continue metformin but reduced back to 500 mg twice a day secondary to the diarrhea.  Instead I will add glipizide 10 mg twice a day for glucose control      Relevant Medications   atorvastatin (LIPITOR) 40 MG tablet   lisinopril (PRINIVIL,ZESTRIL) 40 MG tablet   metFORMIN (GLUCOPHAGE) 500 MG tablet   glipiZIDE (GLUCOTROL) 10 MG tablet   Hyperlipidemia    Due to the formulary and the cost her Lipitor will be reduced to 40 mg once a day so she can only stay on the statin drug it can be increased back to 80 mg when she does get some type of insurance/medication help.      Relevant Medications   atorvastatin (LIPITOR) 40 MG tablet   furosemide (LASIX) 20 MG tablet   lisinopril (  PRINIVIL,ZESTRIL) 40 MG tablet   amLODipine (NORVASC) 10 MG tablet   HYPERTENSION, BENIGN SYSTEMIC - Primary (Chronic)    Blood pressure is mildly elevated.  Today.  She has not been on the metoprolol for many months.  For now we will continue her on the Lasix amlodipine lisinopril Time spent going over local [facility] list to get her medications for sleep as possible.  She is also been a look into the [facility] assistance program or the health department Unable to get any fasting labs today secondary to out-of-pocket expense      Relevant Medications   atorvastatin (LIPITOR) 40 MG tablet   furosemide (LASIX) 20 MG tablet   lisinopril (PRINIVIL,ZESTRIL) 40 MG tablet   amLODipine (NORVASC) 10 MG tablet       Note: This dictation was prepared with Dragon dictation along with smaller phrase technology. Any transcriptional errors that result from this process are unintentional.

## 2018-05-28 NOTE — Addendum Note (Signed)
Addended by: Vic Blackbird F on: 05/28/2018 05:36 PM   Modules accepted: Level of Service

## 2018-05-28 NOTE — Assessment & Plan Note (Signed)
Due to the formulary and the cost her Lipitor will be reduced to 40 mg once a day so she can only stay on the statin drug it can be increased back to 80 mg when she does get some type of insurance/medication help.

## 2018-05-28 NOTE — Assessment & Plan Note (Signed)
Unable to afford Januvia and Trulicity without insurance.  Continue metformin but reduced back to 500 mg twice a day secondary to the diarrhea.  Instead I will add glipizide 10 mg twice a day for glucose control

## 2018-05-28 NOTE — Assessment & Plan Note (Signed)
Lasix daily

## 2018-06-13 ENCOUNTER — Other Ambulatory Visit: Payer: Self-pay

## 2018-06-13 DIAGNOSIS — Z111 Encounter for screening for respiratory tuberculosis: Secondary | ICD-10-CM

## 2018-06-15 LAB — QUANTIFERON-TB GOLD PLUS
Mitogen-NIL: 7.92 IU/mL
NIL: 0.02 IU/mL
QUANTIFERON-TB GOLD PLUS: NEGATIVE
TB1-NIL: <0 IU/mL
TB2-NIL: 0 IU/mL

## 2018-10-02 ENCOUNTER — Other Ambulatory Visit: Payer: Self-pay

## 2018-10-02 ENCOUNTER — Other Ambulatory Visit: Payer: PRIVATE HEALTH INSURANCE

## 2018-10-02 DIAGNOSIS — E1122 Type 2 diabetes mellitus with diabetic chronic kidney disease: Secondary | ICD-10-CM

## 2018-10-02 DIAGNOSIS — E782 Mixed hyperlipidemia: Secondary | ICD-10-CM

## 2018-10-02 DIAGNOSIS — I10 Essential (primary) hypertension: Secondary | ICD-10-CM

## 2018-10-03 LAB — LIPID PANEL
Cholesterol: 156 mg/dL (ref <200)
HDL: 35 mg/dL — ABNORMAL LOW (ref >=50)
LDL Cholesterol (Calc): 103 mg/dL (calc) — ABNORMAL HIGH
Non-HDL Cholesterol (Calc): 121 mg/dL (calc) (ref <130)
Total CHOL/HDL Ratio: 4.5 (calc) (ref <5.0)
Triglycerides: 88 mg/dL (ref <150)

## 2018-10-03 LAB — CBC WITH DIFFERENTIAL/PLATELET
Absolute Monocytes: 437 cells/uL (ref 200–950)
Basophils Absolute: 30 cells/uL (ref 0–200)
Basophils Relative: 0.5 %
Eosinophils Absolute: 177 cells/uL (ref 15–500)
Eosinophils Relative: 3 %
HCT: 41 % (ref 35.0–45.0)
Hemoglobin: 13.7 g/dL (ref 11.7–15.5)
Lymphs Abs: 2000 cells/uL (ref 850–3900)
MCH: 28.2 pg (ref 27.0–33.0)
MCHC: 33.4 g/dL (ref 32.0–36.0)
MCV: 84.5 fL (ref 80.0–100.0)
MPV: 11.7 fL (ref 7.5–12.5)
Monocytes Relative: 7.4 %
Neutro Abs: 3257 cells/uL (ref 1500–7800)
Neutrophils Relative %: 55.2 %
Platelets: 245 10*3/uL (ref 140–400)
RBC: 4.85 10*6/uL (ref 3.80–5.10)
RDW: 12.9 % (ref 11.0–15.0)
Total Lymphocyte: 33.9 %
WBC: 5.9 10*3/uL (ref 3.8–10.8)

## 2018-10-03 LAB — COMPLETE METABOLIC PANEL WITH GFR
AG Ratio: 1.3 (calc) (ref 1.0–2.5)
ALT: 15 U/L (ref 6–29)
AST: 14 U/L (ref 10–35)
Albumin: 3.7 g/dL (ref 3.6–5.1)
Alkaline phosphatase (APISO): 78 U/L (ref 31–125)
BUN: 11 mg/dL (ref 7–25)
CO2: 23 mmol/L (ref 20–32)
Calcium: 9.1 mg/dL (ref 8.6–10.2)
Chloride: 105 mmol/L (ref 98–110)
Creat: 0.85 mg/dL (ref 0.50–1.10)
GFR, Est African American: 93 mL/min/{1.73_m2} (ref >=60)
GFR, Est Non African American: 80 mL/min/{1.73_m2} (ref >=60)
Globulin: 2.9 g/dL (calc) (ref 1.9–3.7)
Glucose, Bld: 202 mg/dL — ABNORMAL HIGH (ref 65–99)
Potassium: 4.1 mmol/L (ref 3.5–5.3)
Sodium: 138 mmol/L (ref 135–146)
Total Bilirubin: 0.4 mg/dL (ref 0.2–1.2)
Total Protein: 6.6 g/dL (ref 6.1–8.1)

## 2018-10-03 LAB — HEMOGLOBIN A1C
Hgb A1c MFr Bld: 9.4 % of total Hgb — ABNORMAL HIGH (ref <5.7)
Mean Plasma Glucose: 223 (calc)
eAG (mmol/L): 12.4 (calc)

## 2018-10-05 ENCOUNTER — Ambulatory Visit (INDEPENDENT_AMBULATORY_CARE_PROVIDER_SITE_OTHER): Payer: PRIVATE HEALTH INSURANCE | Admitting: Family Medicine

## 2018-10-05 ENCOUNTER — Other Ambulatory Visit: Payer: Self-pay

## 2018-10-05 VITALS — BP 138/90 | HR 99 | Temp 98.6°F | Resp 18 | Ht 62.0 in | Wt 247.4 lb

## 2018-10-05 DIAGNOSIS — I10 Essential (primary) hypertension: Secondary | ICD-10-CM

## 2018-10-05 DIAGNOSIS — M67471 Ganglion, right ankle and foot: Secondary | ICD-10-CM

## 2018-10-05 DIAGNOSIS — E782 Mixed hyperlipidemia: Secondary | ICD-10-CM

## 2018-10-05 DIAGNOSIS — K219 Gastro-esophageal reflux disease without esophagitis: Secondary | ICD-10-CM | POA: Diagnosis not present

## 2018-10-05 DIAGNOSIS — N182 Chronic kidney disease, stage 2 (mild): Secondary | ICD-10-CM

## 2018-10-05 DIAGNOSIS — I872 Venous insufficiency (chronic) (peripheral): Secondary | ICD-10-CM

## 2018-10-05 DIAGNOSIS — M2141 Flat foot [pes planus] (acquired), right foot: Secondary | ICD-10-CM

## 2018-10-05 DIAGNOSIS — E1122 Type 2 diabetes mellitus with diabetic chronic kidney disease: Secondary | ICD-10-CM | POA: Diagnosis not present

## 2018-10-05 DIAGNOSIS — Z6841 Body Mass Index (BMI) 40.0 and over, adult: Secondary | ICD-10-CM

## 2018-10-05 DIAGNOSIS — M2142 Flat foot [pes planus] (acquired), left foot: Secondary | ICD-10-CM

## 2018-10-05 MED ORDER — METFORMIN HCL 500 MG PO TABS: 500.0000 mg | ORAL_TABLET | Freq: Two times a day (BID) | ORAL | 3 refills | Status: AC

## 2018-10-05 MED ORDER — BLOOD GLUCOSE TEST VI STRP: ORAL_STRIP | 12 refills | Status: AC

## 2018-10-05 MED ORDER — ATORVASTATIN CALCIUM 80 MG PO TABS: 80.0000 mg | ORAL_TABLET | Freq: Every day | ORAL | 2 refills | Status: DC

## 2018-10-05 MED ORDER — LANCETS MISC: 12 refills | Status: AC

## 2018-10-05 MED ORDER — AMLODIPINE BESYLATE 10 MG PO TABS: 10.0000 mg | ORAL_TABLET | Freq: Every day | ORAL | 3 refills | Status: AC

## 2018-10-05 MED ORDER — LISINOPRIL 40 MG PO TABS: 40.0000 mg | ORAL_TABLET | Freq: Every day | ORAL | 3 refills | Status: AC

## 2018-10-05 MED ORDER — FUROSEMIDE 20 MG PO TABS: ORAL_TABLET | ORAL | 3 refills | Status: AC

## 2018-10-05 MED ORDER — OMEPRAZOLE 40 MG PO CPDR: 40.0000 mg | DELAYED_RELEASE_CAPSULE | Freq: Every day | ORAL | 3 refills | Status: DC

## 2018-10-05 MED ORDER — SITAGLIPTIN PHOSPHATE 100 MG PO TABS: 100.0000 mg | ORAL_TABLET | Freq: Every day | ORAL | 2 refills | Status: AC

## 2018-10-05 MED ORDER — TRULICITY 0.75 MG/0.5ML ~~LOC~~ SOAJ: SUBCUTANEOUS | 3 refills | Status: DC

## 2018-10-05 NOTE — Progress Notes (Signed)
Subjective:    Patient ID: [name], female    DOB: 1970/06/29, 48 y.o.   MRN: [id number]  Patient presents for Results (follow up)  Pt here to chronic medical problems, at our last visit she did not have insurance, meds were changed to help her get by without stopping them all. She now needs meds reinstated back to previous dosing, brands.  DM- recent labs reviewed   Taking metformi 500mg  BID and glipizide 10mg  BID, has was not taking her regular doses due to cost   previous  7.1 > 9.4% now    Needs to restart [place] and trulicity   HTN- taking lasix, norvasc  Hyperiipidemia- LDL increased bu tlipitor decreased to  40mg  due to cost, previously on 80mg    Weight down 3lbs    Return of cyst on right foot base of great toe , needs new referral back to [facility], to continue her care under new insurance   Review Of Systems:  GEN- denies fatigue, fever, weight loss,weakness, recent illness HEENT- denies eye drainage, change in vision, nasal discharge, CVS- denies chest pain, palpitations RESP- denies SOB, cough, wheeze ABD- denies N/V, change in stools, abd pain GU- denies dysuria, hematuria, dribbling, incontinence MSK- + joint pain, muscle aches, injury Neuro- denies headache, dizziness, syncope, seizure activity       Objective:    BP 138/90   Pulse 99   Temp 98.6 F (37 C)   Resp 18   Ht 5\' 2"  (1.575 m)   Wt 247 lb 6.4 oz (112.2 kg)   SpO2 97%   BMI 45.25 kg/m  GEN- NAD, alert and oriented x3 HEENT- PERRL, EOMI, non injected sclera, pink conjunctiva, MMM, oropharynx clear Neck- Supple, no thyromegaly CVS- RRR, no murmur RESP-CTAB ABD-NABS,soft,NT,ND EXT-trace ankle  edema, small nodule base of right toe, TTP, fair ROM bilat ankles  Pulses- Radial, DP- 2+        Assessment & Plan:      Problem List Items Addressed This Visit      Unprioritized   Chronic venous insufficiency    Continue lasix      Relevant Medications   lisinopril (ZESTRIL) 40 MG  tablet   furosemide (LASIX) 20 MG tablet   atorvastatin (LIPITOR) 80 MG tablet   amLODipine (NORVASC) 10 MG tablet   CKD (chronic kidney disease) stage 2, 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)   Diabetes mellitus (HCC) (Chronic)    Restart trulicity 0.75mg  weekly Januvia Continue metformin 500mg  BID, does not tolerate higher dose D/C glipizide      Relevant Medications   sitaGLIPtin (JANUVIA) 100 MG tablet   lisinopril (ZESTRIL) 40 MG tablet   metFORMIN (GLUCOPHAGE) 500 MG tablet   atorvastatin (LIPITOR) 80 MG tablet   Dulaglutide (TRULICITY) 3.15 VV/6.1YW SOPN   GASTROESOPHAGEAL REFLUX, NO ESOPHAGITIS - Primary (Chronic)   Relevant Medications   omeprazole (PRILOSEC) 40 MG capsule   Hyperlipidemia    Increase lipitor back to 80mg       Relevant Medications   lisinopril (ZESTRIL) 40 MG tablet   furosemide (LASIX) 20 MG tablet   atorvastatin (LIPITOR) 80 MG tablet   amLODipine (NORVASC) 10 MG tablet   HYPERTENSION, BENIGN SYSTEMIC (Chronic)    Mild elevation today, continue to monitor BP at home Was also on beta blocker in past       Relevant Medications   lisinopril (ZESTRIL) 40 MG tablet   furosemide (LASIX) 20 MG tablet   atorvastatin (LIPITOR) 80 MG tablet   amLODipine (NORVASC) 10  MG tablet   Obesity   Relevant Medications   sitaGLIPtin (JANUVIA) 100 MG tablet   metFORMIN (GLUCOPHAGE) 500 MG tablet   Dulaglutide (TRULICITY) 4.09 BD/5.3GD SOPN      Note: This dictation was prepared with Dragon dictation along with smaller phrase technology. Any transcriptional errors that result from this process are unintentional.

## 2018-10-05 NOTE — Patient Instructions (Addendum)
F/u 3 months  Referral to Dr. Tamala Julian  Test strips and lancets to be refilled  Take the trulicity 0.75mg  for 4 weeks, then increase to the  1.5

## 2018-10-06 ENCOUNTER — Encounter: Payer: Self-pay | Admitting: Family Medicine

## 2018-10-06 NOTE — Assessment & Plan Note (Signed)
Increase lipitor back to 80mg 

## 2018-10-06 NOTE — Assessment & Plan Note (Signed)
Restart trulicity 0.75mg  weekly Januvia Continue metformin 500mg  BID, does not tolerate higher dose D/C glipizide

## 2018-10-06 NOTE — Assessment & Plan Note (Signed)
Mild elevation today, continue to monitor BP at home Was also on beta blocker in past

## 2018-10-06 NOTE — Assessment & Plan Note (Signed)
Continue lasix  °

## 2018-10-24 ENCOUNTER — Ambulatory Visit (INDEPENDENT_AMBULATORY_CARE_PROVIDER_SITE_OTHER): Payer: PRIVATE HEALTH INSURANCE | Admitting: Family Medicine

## 2018-10-24 ENCOUNTER — Encounter: Payer: Self-pay | Admitting: Family Medicine

## 2018-10-24 ENCOUNTER — Ambulatory Visit: Payer: Self-pay

## 2018-10-24 ENCOUNTER — Other Ambulatory Visit: Payer: Self-pay

## 2018-10-24 VITALS — BP 140/108 | HR 99 | Ht 62.0 in | Wt 249.0 lb

## 2018-10-24 DIAGNOSIS — M67471 Ganglion, right ankle and foot: Secondary | ICD-10-CM | POA: Diagnosis not present

## 2018-10-24 DIAGNOSIS — M66872 Spontaneous rupture of other tendons, left ankle and foot: Secondary | ICD-10-CM | POA: Diagnosis not present

## 2018-10-24 DIAGNOSIS — M79671 Pain in right foot: Secondary | ICD-10-CM

## 2018-10-24 MED ORDER — DICLOFENAC SODIUM 2 % TD SOLN: 2.0000 g | Freq: Two times a day (BID) | TRANSDERMAL | 3 refills | Status: AC

## 2018-10-24 NOTE — Assessment & Plan Note (Addendum)
Bilateral injections given today.  Encourage patient to continue to wear the home exercises.  Patient is having also a plantar fibromatosis Injections if this continues.  We discussed icing regimen.  Which activities (avoid.  Discussed proper shoes.  Topical anti-inflammatories prescribed.  Follow-up again 6  weeks

## 2018-10-24 NOTE — Progress Notes (Signed)
[name] [address], [place] [address] Phone: [phone] Subjective:    [name], am serving as a scribe for Dr. Hulan Saas.  CC: foot pain   DBZ:[id number]   07/14/2018: Repeat injection given today.  Discussed icing regimen and home exercises.  Discussed which activities to do which wants to avoid.  Patient to increase activity as tolerated.  Follow-up with me again in 4 weeks  Update 10/24/2018: [name] is a 47 y.o. female coming in with complaint of bilateral foot pain. Patient states that she has been having right foot pain over the bottom of the foot. Believes that the cyst has come back.   Feels pain in left foot as well. Pain in medial longitudinal arch. Pain has been intermittent but increasing. Pain in mornings and with weight bearing. Is wearing new balance shoes.  Patient has noticed that certain shoes seem to make it worse.     Past Medical History:  Diagnosis Date  . Abnormal Pap smear   . Allergy    seasonal  . Atrial fibrillation ([place])   . Diabetes mellitus 2012  . GERD (gastroesophageal reflux disease)   . Hemorrhoids   . Hyperlipidemia 2015  . Hypertension 1995  . Obesity   . Plantar fasciitis   . Sickle cell anemia (HCC)    sickle cell trait  . Sickle cell trait ([place]) 1995   Past Surgical History:  Procedure Laterality Date  . ABDOMINAL HYSTERECTOMY  2005   for Fibroids  . CESAREAN SECTION    . ILIAC VEIN ANGIOPLASTY / STENTING    . SKIN TAG REMOVAL  Sept. 25, 2015   Right uppper arm  . TUBAL LIGATION    . VENOGRAM N/A 11/07/2012   Procedure: VENOGRAM;  Surgeon: Serafina Mitchell, MD;  Location: [facility] CATH LAB;  Service: Cardiovascular;  Laterality: N/A;  . VENOGRAM N/A 04/15/2014   Procedure: VENOGRAM;  Surgeon: Serafina Mitchell, MD;  Location: [facility] CATH LAB;  Service: Cardiovascular;  Laterality: N/A;   Social History   Socioeconomic History  . Marital status: Single    Spouse name: Not on file   . Number of children: Not on file  . Years of education: Not on file  . Highest education level: Not on file  Occupational History  . Not on file  Social Needs  . Financial resource strain: Not on file  . Food insecurity    Worry: Not on file    Inability: Not on file  . Transportation needs    Medical: Not on file    Non-medical: Not on file  Tobacco Use  . Smoking status: Never Smoker  . Smokeless tobacco: Never Used  Substance and Sexual Activity  . Alcohol use: [name]  . Drug use: [name]  . Sexual activity: Not Currently  Lifestyle  . Physical activity    Days per week: Not on file    Minutes per session: Not on file  . Stress: Not on file  Relationships  . Social Herbalist on phone: Not on file    Gets together: Not on file    Attends religious service: Not on file    Active member of club or organization: Not on file    Attends meetings of clubs or organizations: Not on file    Relationship status: Not on file  Other Topics Concern  . Not on file  Social History Narrative  . Not on file  Allergies  Allergen Reactions  . Other     Pt. States every time she is "put to sleep" she because becomes nauseated and runs a fever"  . Victoza [Liraglutide]     Nausea/vomiting  . Tape Rash   Family History  Problem Relation Age of Onset  . Diabetes Mother   . Hypertension Mother   . Hyperlipidemia Mother   . Heart disease Sister        [employer]  . Hearing loss Sister   . Learning disabilities Sister   . Arthritis Maternal Grandmother   . Stroke Maternal Grandmother   . Hypertension Other   . Learning disabilities Daughter   . Asthma Maternal Aunt   . Diabetes Maternal Aunt   . Hyperlipidemia Maternal Aunt   . Hypertension Maternal Aunt   . Heart attack Neg Hx   . Sudden death Neg Hx   . Colon cancer Neg Hx   . Esophageal cancer Neg Hx   . Rectal cancer Neg Hx   . Stomach cancer Neg Hx     Current Outpatient Medications (Endocrine &  Metabolic):  Marland Kitchen  Dulaglutide (TRULICITY) 2.99 ME/2.6ST SOPN, Inject  0.65m once a week for diabetes .  metFORMIN (GLUCOPHAGE) 500 MG tablet, Take 1 tablet (500 mg total) by mouth 2 (two) times daily with a meal. .  sitaGLIPtin (JANUVIA) 100 MG tablet, Take 1 tablet (100 mg total) by mouth daily.  Current Outpatient Medications (Cardiovascular):  .  amLODipine (NORVASC) 10 MG tablet, Take 1 tablet (10 mg total) by mouth daily. .Marland Kitchen atorvastatin (LIPITOR) 80 MG tablet, Take 1 tablet (80 mg total) by mouth daily. .  furosemide (LASIX) 20 MG tablet, TAKE 1 TABLET(20 MG) BY MOUTH DAILY AS NEEDED .  lisinopril (ZESTRIL) 40 MG tablet, Take 1 tablet (40 mg total) by mouth daily.  Current Outpatient Medications (Respiratory):  .  fluticasone (FLONASE) 50 MCG/ACT nasal spray, Place 2 sprays into both nostrils daily.  Current Outpatient Medications (Analgesics):  .  aspirin 81 MG EC tablet, Take 81 mg by mouth daily.    Current Outpatient Medications (Other):  .  blood glucose meter kit and supplies, Please dispense One Touch Ultra. Use to monitor FSBS 2x daily. Dx: E11.9. .Marland Kitchen clotrimazole (LOTRIMIN) 1 % cream, Apply 1 application topically 2 (two) times daily. .  Glucose Blood (BLOOD GLUCOSE TEST STRIPS) STRP, Use to monitor FSBS 2x daily. Dx E11.9 .  Lancets MISC, Use to monitor FSBS 2x daily. Dx E11.9 .  lidocaine-prilocaine (EMLA) cream, Apply 1 application topically as needed. .Marland Kitchen omeprazole (PRILOSEC) 40 MG capsule, Take 1 capsule (40 mg total) by mouth daily. .  Diclofenac Sodium 2 % SOLN, Place 2 g onto the skin 2 (two) times daily.    Past medical history, social, surgical and family history all reviewed in electronic medical record.  [name] pertanent information unless stated regarding to the chief complaint.   Review of Systems:  [name] headache, visual changes, nausea, vomiting, diarrhea, constipation, dizziness, abdominal pain, skin rash, fevers, chills, night sweats, weight loss, swollen lymph  nodes, body aches, joint swelling,  chest pain, shortness of breath, mood changes.  Positive muscle aches  Objective  Blood pressure (!) 140/108, pulse 99, height _0  (1.575 m), weight 249 lb (112.9 kg), SpO2 97 %.    General: [name] apparent distress alert and oriented x3 mood and affect normal, dressed appropriately.  HEENT: Pupils equal, extraocular movements intact  Respiratory: Patient's speak in full sentences and  does not appear short of breath  Cardiovascular: [name] lower extremity edema, non tender, [name] erythema  Skin: Warm dry intact with [name] signs of infection or rash on extremities or on axial skeleton.  Abdomen: Soft nontender  Neuro: Cranial nerves II through XII are intact, neurovascularly intact in all extremities with 2+ DTRs and 2+ pulses.  Lymph: [name] lymphadenopathy of posterior or anterior cervical chain or axillae bilaterally.  Gait mild antalgic MSK:  tender with full range of motion and good stability and symmetric strength and tone of shoulders, elbows, wrist, hip, knee bilaterally.   Bilateral ankle exam shows the patient does have overpronation of the spine bilaterally.  Significant tenderness to palpation over the posterior tibialis tendon bilaterally.  Patient does have insufficiency of the posterior tendon with [name] supination occurring when patient plantar flexes.  Patient does have a fairly large cyst over the first metatarsal but seems to be more calcific than previously.  Procedure: Real-time Ultrasound Guided Injection of right posterior tibialis tendon sheath Device: GE Logiq Q7 Ultrasound guided injection is preferred based studies that show increased duration, increased effect, greater accuracy, decreased procedural pain, increased response rate, and decreased cost with ultrasound guided versus blind injection.  Verbal informed consent obtained.  Time-out conducted.  Noted [name] overlying erythema, induration, or other signs of local infection.  Skin prepped in a  sterile fashion.  Local anesthesia: Topical Ethyl chloride.  With sterile technique and under real time ultrasound guidance: With a 25-gauge half inch needle was injected with 0.5 cc of 0.5% Marcaine and 0.5 cc of Kenalog 40 mg/mL Completed without difficulty  Pain immediately resolved suggesting accurate placement of the medication.  Advised to call if fevers/chills, erythema, induration, drainage, or persistent bleeding.  Images permanently stored and available for review in the ultrasound unit.  Impression: Technically successful ultrasound guided injection.  Procedure: Real-time Ultrasound Guided Injection of left posterior tibialis tendon sheath Device: GE Logiq Q7 Ultrasound guided injection is preferred based studies that show increased duration, increased effect, greater accuracy, decreased procedural pain, increased response rate, and decreased cost with ultrasound guided versus blind injection.  Verbal informed consent obtained.  Time-out conducted.  Noted [name] overlying erythema, induration, or other signs of local infection.  Skin prepped in a sterile fashion.  Local anesthesia: Topical Ethyl chloride.  With sterile technique and under real time ultrasound guidance: With a 25-gauge half inch needle was injected with 0.5 cc of 0.5% Marcaine and 0.5 cc of Kenalog 40 mg/mL Completed without difficulty  Pain immediately resolved suggesting accurate placement of the medication.  Advised to call if fevers/chills, erythema, induration, drainage, or persistent bleeding.  Images permanently stored and available for review in the ultrasound unit.  Impression: Technically successful ultrasound guided injection.   Impression and Recommendations:     This case required medical decision making of moderate complexity. The above documentation has been reviewed and is accurate and complete Lyndal Pulley, DO       Note: This dictation was prepared with Dragon dictation along with smaller  phrase technology. Any transcriptional errors that result from this process are unintentional.

## 2018-10-24 NOTE — Patient Instructions (Signed)
Injected both ankles today Continue exercises Pennsaid twice daily Keep wearing good shoes See me in 6 weeks if not better

## 2018-10-24 NOTE — Assessment & Plan Note (Signed)
Has been nearly a year.  Starting to increase in size may need to consider the injection at follow-up in 4 to 6 weeks

## 2018-12-10 ENCOUNTER — Ambulatory Visit: Payer: PRIVATE HEALTH INSURANCE | Admitting: Family Medicine

## 2019-01-07 ENCOUNTER — Ambulatory Visit: Payer: PRIVATE HEALTH INSURANCE | Admitting: Family Medicine

## 2019-02-05 ENCOUNTER — Ambulatory Visit: Payer: PRIVATE HEALTH INSURANCE | Admitting: Family Medicine

## 2019-03-13 ENCOUNTER — Encounter: Payer: Self-pay | Admitting: Family Medicine

## 2019-03-13 ENCOUNTER — Ambulatory Visit (INDEPENDENT_AMBULATORY_CARE_PROVIDER_SITE_OTHER): Payer: BC Managed Care – PPO | Admitting: Family Medicine

## 2019-03-13 VITALS — BP 142/84 | HR 90 | Temp 98.1°F | Resp 16 | Ht 62.0 in | Wt 244.0 lb

## 2019-03-13 DIAGNOSIS — G8929 Other chronic pain: Secondary | ICD-10-CM

## 2019-03-13 DIAGNOSIS — J302 Other seasonal allergic rhinitis: Secondary | ICD-10-CM

## 2019-03-13 DIAGNOSIS — Z6841 Body Mass Index (BMI) 40.0 and over, adult: Secondary | ICD-10-CM

## 2019-03-13 DIAGNOSIS — E1122 Type 2 diabetes mellitus with diabetic chronic kidney disease: Secondary | ICD-10-CM | POA: Diagnosis not present

## 2019-03-13 DIAGNOSIS — I10 Essential (primary) hypertension: Secondary | ICD-10-CM | POA: Diagnosis not present

## 2019-03-13 DIAGNOSIS — N182 Chronic kidney disease, stage 2 (mild): Secondary | ICD-10-CM

## 2019-03-13 DIAGNOSIS — E782 Mixed hyperlipidemia: Secondary | ICD-10-CM

## 2019-03-13 DIAGNOSIS — M546 Pain in thoracic spine: Secondary | ICD-10-CM

## 2019-03-13 DIAGNOSIS — I872 Venous insufficiency (chronic) (peripheral): Secondary | ICD-10-CM | POA: Diagnosis not present

## 2019-03-13 MED ORDER — TRULICITY 1.5 MG/0.5ML ~~LOC~~ SOAJ: 1.5000 mg | SUBCUTANEOUS | 3 refills | Status: AC

## 2019-03-13 MED ORDER — FLUTICASONE PROPIONATE 50 MCG/ACT NA SUSP: 2.0000 | Freq: Every day | NASAL | 2 refills | Status: AC

## 2019-03-13 NOTE — Progress Notes (Signed)
Subjective:    Patient ID: [name], female    DOB: 1971/01/09, 48 y.o.   MRN: [id number]  Patient presents for Follow-up (is fasting) and Illness   Pt has had sneezing, nasal congestion, itchy throat for a week, then started dry cough feels some drainage , no fever, no vomiting, no diarrhea body aches.no facial pain,no loss of taste or smell She does feel a catch on her right side  She has been taking mucinex, which is helping.  She has  Not had covid testing, has not taken any allergy meds yet  Still sneezing a lot   DM- last A1C 9.4%, CBG 140's fasting   Metformin 500mg  BID, turlicity 1.5 once a week, ( she does feel a little sluggish hte next day, but then improves) , [place] 100mg   She has sciatica nerve problems, using pain patches OTC. This is chronic, no new concerns for this   HTN- taking norvasc, lisinopril, lasix 20mg    [facility] visit   End of visit states she gets vaginal irritation on outside was using OTC cream "vagisil" helps a little, no discharge, no vaginal bleeding    Review Of Systems:  GEN- denies fatigue, fever, weight loss,weakness, recent illness HEENT- denies eye drainage, change in vision, +nasal discharge, CVS- denies chest pain, palpitations RESP- denies SOB, cough, wheeze ABD- denies N/V, change in stools, abd pain GU- denies dysuria, hematuria, dribbling, incontinence MSK- denies joint pain, muscle aches, injury Neuro- denies headache, dizziness, syncope, seizure activity       Objective:    BP (!) 142/84 Comment: has not taken BP meds today  Pulse 90   Temp 98.1 F (36.7 C) (Temporal)   Resp 16   Ht 5\' 2"  (1.575 m)   Wt 244 lb (110.7 kg)   SpO2 99%   BMI 44.63 kg/m  GEN- NAD, alert and oriented x3 HEENT- PERRL, EOMI, non injected sclera, pink conjunctiva, MMM, oropharynx clear, nares clear rhinorrhea, TM clear no effusion, no sinus tenderness,  Neck- Supple, no LAD CVS- RRR, no  murmur RESP-CTAB ABD-NABS,soft,NT,ND EXT-trace  edema Pulses- Radial, DP- 2+       Assessment & Plan:   Pt can try her clotrimazole for the external vaginal irritation, not examined today   Problem List Items Addressed This Visit      Unprioritized   Chronic back pain    Uses topical meds      Chronic venous insufficiency    Continue prn lasix       CKD (chronic kidney disease) stage 2, 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)   Diabetes mellitus (HCC) (Chronic)    Goal is A1C less than 7% Continue MTF, trulicity, januvia       Relevant Medications   Dulaglutide (TRULICITY) 1.5 0000000 SOPN   Other Relevant Orders   Hemoglobin A1c (Completed)   Lipid Panel (Completed)   Microalbumin/Creatinine Ratio, Urine (Completed)   Hyperlipidemia    Pt on statin drug, goal LDL less than 100      HYPERTENSION, BENIGN SYSTEMIC - Primary (Chronic)    BP is mildly elevated but near her baseline  Weight loss and dietary change would help      Relevant Orders   CBC with Differential (Completed)   Comprehensive metabolic panel (Completed)   Lipid Panel (Completed)   Obesity   Relevant Medications   Dulaglutide (TRULICITY) 1.5 0000000 SOPN    Other Visit Diagnoses    Seasonal allergies  add oral anti-histamine and nasal steroids/saline rinse, if symptoms progress, with fever, productive cough, facial pain, add antibiotic and covid testing      Note: This dictation was prepared with Dragon dictation along with smaller phrase technology. Any transcriptional errors that result from this process are unintentional.

## 2019-03-13 NOTE — Patient Instructions (Addendum)
Continue mucinex, add flonase and nasal saline  We will call with lab results  Use clotrimazole cream twice a day to skin for the irritation F/U 4 months

## 2019-03-14 ENCOUNTER — Encounter: Payer: Self-pay | Admitting: Family Medicine

## 2019-03-14 LAB — CBC WITH DIFFERENTIAL/PLATELET
Absolute Monocytes: 774 cells/uL (ref 200–950)
Basophils Absolute: 32 cells/uL (ref 0–200)
Basophils Relative: 0.6 %
Eosinophils Absolute: 21 cells/uL (ref 15–500)
Eosinophils Relative: 0.4 %
HCT: 48.7 % — ABNORMAL HIGH (ref 35.0–45.0)
Hemoglobin: 16 g/dL — ABNORMAL HIGH (ref 11.7–15.5)
Lymphs Abs: 1473 cells/uL (ref 850–3900)
MCH: 27.4 pg (ref 27.0–33.0)
MCHC: 32.9 g/dL (ref 32.0–36.0)
MCV: 83.4 fL (ref 80.0–100.0)
MPV: 11.3 fL (ref 7.5–12.5)
Monocytes Relative: 14.6 %
Neutro Abs: 3000 cells/uL (ref 1500–7800)
Neutrophils Relative %: 56.6 %
Platelets: 186 10*3/uL (ref 140–400)
RBC: 5.84 10*6/uL — ABNORMAL HIGH (ref 3.80–5.10)
RDW: 13.9 % (ref 11.0–15.0)
Total Lymphocyte: 27.8 %
WBC: 5.3 10*3/uL (ref 3.8–10.8)

## 2019-03-14 LAB — COMPREHENSIVE METABOLIC PANEL
AG Ratio: 1.1 (calc) (ref 1.0–2.5)
ALT: 14 U/L (ref 6–29)
AST: 17 U/L (ref 10–35)
Albumin: 4.2 g/dL (ref 3.6–5.1)
Alkaline phosphatase (APISO): 81 U/L (ref 31–125)
BUN/Creatinine Ratio: 8 (calc) (ref 6–22)
BUN: 9 mg/dL (ref 7–25)
CO2: 24 mmol/L (ref 20–32)
Calcium: 9.3 mg/dL (ref 8.6–10.2)
Chloride: 101 mmol/L (ref 98–110)
Creat: 1.16 mg/dL — ABNORMAL HIGH (ref 0.50–1.10)
Globulin: 3.9 g/dL (calc) — ABNORMAL HIGH (ref 1.9–3.7)
Glucose, Bld: 105 mg/dL — ABNORMAL HIGH (ref 65–99)
Potassium: 4.2 mmol/L (ref 3.5–5.3)
Sodium: 137 mmol/L (ref 135–146)
Total Bilirubin: 0.5 mg/dL (ref 0.2–1.2)
Total Protein: 8.1 g/dL (ref 6.1–8.1)

## 2019-03-14 LAB — LIPID PANEL
Cholesterol: 164 mg/dL (ref <200)
HDL: 27 mg/dL — ABNORMAL LOW (ref >=50)
LDL Cholesterol (Calc): 118 mg/dL (calc) — ABNORMAL HIGH
Non-HDL Cholesterol (Calc): 137 mg/dL (calc) — ABNORMAL HIGH (ref <130)
Total CHOL/HDL Ratio: 6.1 (calc) — ABNORMAL HIGH (ref <5.0)
Triglycerides: 93 mg/dL (ref <150)

## 2019-03-14 LAB — HEMOGLOBIN A1C
Hgb A1c MFr Bld: 7.4 % of total Hgb — ABNORMAL HIGH (ref <5.7)
Mean Plasma Glucose: 166 (calc)
eAG (mmol/L): 9.2 (calc)

## 2019-03-14 LAB — MICROALBUMIN / CREATININE URINE RATIO
Creatinine, Urine: 111 mg/dL (ref 20–275)
Microalb Creat Ratio: 14 mcg/mg creat (ref <30)
Microalb, Ur: 1.5 mg/dL

## 2019-03-14 MED ORDER — OMEPRAZOLE 40 MG PO CPDR: 40.0000 mg | DELAYED_RELEASE_CAPSULE | Freq: Every day | ORAL | 3 refills | Status: AC

## 2019-03-14 MED ORDER — ATORVASTATIN CALCIUM 80 MG PO TABS: 80.0000 mg | ORAL_TABLET | Freq: Every day | ORAL | 2 refills | Status: AC

## 2019-03-14 NOTE — Assessment & Plan Note (Signed)
BP is mildly elevated but near her baseline  Weight loss and dietary change would help

## 2019-03-14 NOTE — Assessment & Plan Note (Signed)
Goal is A1C less than 7% Continue MTF, trulicity, [place]

## 2019-03-14 NOTE — Assessment & Plan Note (Signed)
Continue prn lasix  

## 2019-03-14 NOTE — Assessment & Plan Note (Signed)
Pt on statin drug, goal LDL less than 100

## 2019-03-14 NOTE — Assessment & Plan Note (Signed)
Uses topical meds

## 2019-03-25 ENCOUNTER — Telehealth: Payer: Self-pay | Admitting: Family Medicine

## 2019-03-25 NOTE — Telephone Encounter (Signed)
I received a call from [facility] on 20-Apr-2023 patient died approximately 08/15/1544 secondary to complications from [id number]. They were unable to revive her

## 2019-03-26 DEATH — deceased

## 2019-04-28 ENCOUNTER — Other Ambulatory Visit: Payer: Self-pay | Admitting: Family Medicine

## 2019-05-04 ENCOUNTER — Other Ambulatory Visit: Payer: Self-pay | Admitting: Family Medicine

## 2019-07-12 ENCOUNTER — Ambulatory Visit: Payer: PRIVATE HEALTH INSURANCE | Admitting: Family Medicine

## 2019-10-25 ENCOUNTER — Other Ambulatory Visit: Payer: Self-pay | Admitting: Family Medicine

## 2019-10-25 DIAGNOSIS — I10 Essential (primary) hypertension: Secondary | ICD-10-CM
